# Patient Record
Sex: Female | Born: 1960 | Race: White | Hispanic: No | Marital: Married | Smoking: Never smoker
Health system: Southern US, Community
[De-identification: ages and names within clinical notes are randomized; demographics above are authoritative.]

## PROBLEM LIST (undated history)

## (undated) DIAGNOSIS — M1990 Unspecified osteoarthritis, unspecified site: Secondary | ICD-10-CM

## (undated) DIAGNOSIS — Z87442 Personal history of urinary calculi: Secondary | ICD-10-CM

## (undated) DIAGNOSIS — J45909 Unspecified asthma, uncomplicated: Secondary | ICD-10-CM

## (undated) DIAGNOSIS — T8859XA Other complications of anesthesia, initial encounter: Secondary | ICD-10-CM

## (undated) DIAGNOSIS — M25561 Pain in right knee: Secondary | ICD-10-CM

## (undated) DIAGNOSIS — G4730 Sleep apnea, unspecified: Secondary | ICD-10-CM

## (undated) DIAGNOSIS — I10 Essential (primary) hypertension: Secondary | ICD-10-CM

## (undated) HISTORY — PX: COLONOSCOPY: SHX174

## (undated) HISTORY — DX: Essential (primary) hypertension: I10

## (undated) HISTORY — PX: OTHER SURGICAL HISTORY: SHX169

---

## 1997-12-04 ENCOUNTER — Ambulatory Visit (HOSPITAL_COMMUNITY): Admission: RE | Admit: 1997-12-04 | Discharge: 1997-12-04 | Payer: Self-pay | Admitting: Dentistry

## 2010-11-23 ENCOUNTER — Ambulatory Visit: Payer: Self-pay

## 2010-11-25 ENCOUNTER — Ambulatory Visit: Payer: Self-pay

## 2013-08-27 ENCOUNTER — Ambulatory Visit: Payer: Self-pay | Admitting: Family Medicine

## 2015-05-11 ENCOUNTER — Ambulatory Visit (INDEPENDENT_AMBULATORY_CARE_PROVIDER_SITE_OTHER): Payer: BC Managed Care – PPO | Admitting: Family Medicine

## 2015-05-11 ENCOUNTER — Encounter: Payer: Self-pay | Admitting: Family Medicine

## 2015-05-11 VITALS — BP 132/80 | HR 80 | Ht 67.0 in | Wt 267.0 lb

## 2015-05-11 DIAGNOSIS — J0100 Acute maxillary sinusitis, unspecified: Secondary | ICD-10-CM | POA: Diagnosis not present

## 2015-05-11 DIAGNOSIS — J40 Bronchitis, not specified as acute or chronic: Secondary | ICD-10-CM

## 2015-05-11 MED ORDER — GUAIFENESIN-CODEINE 100-10 MG/5ML PO SOLN: 5.0000 mL | Freq: Three times a day (TID) | ORAL | Status: DC | PRN

## 2015-05-11 MED ORDER — AMOXICILLIN-POT CLAVULANATE 875-125 MG PO TABS: 1.0000 | ORAL_TABLET | Freq: Two times a day (BID) | ORAL | Status: DC

## 2015-05-11 NOTE — Progress Notes (Signed)
Name: Theresa PalauChristine P Best   MRN: [id number]    DOB: 03/02/1961   Date:05/11/2015       Progress Note  Subjective  Chief Complaint  Chief Complaint  Patient presents with  . Sinusitis    cough    Sinusitis This is a new problem. The current episode started in the past 7 days. The problem has been waxing and waning since onset. The maximum temperature recorded prior to her arrival was 100.4 - 100.9 F. The fever has been present for 1 to 2 days. Her pain is at a severity of 6/10. The pain is mild. Associated symptoms include congestion, coughing, diaphoresis, ear pain, headaches, neck pain, shortness of breath, sinus pressure, sneezing and a sore throat. Pertinent negatives include no chills or hoarse voice. Past treatments include acetaminophen and oral decongestants. The treatment provided no relief.    No problem-specific assessment & plan notes found for this encounter.   No past medical history on file.  No past surgical history on file.  No family history on file.  Social History   Social History  . Marital Status: Unknown    Spouse Name: N/A  . Number of Children: N/A  . Years of Education: N/A   Occupational History  . Not on file.   Social History Main Topics  . Smoking status: Never Smoker   . Smokeless tobacco: Not on file  . Alcohol Use: No  . Drug Use: No  . Sexual Activity: Not on file   Other Topics Concern  . Not on file   Social History Narrative  . No narrative on file    No Known Allergies   Review of Systems  Constitutional: Positive for diaphoresis. Negative for fever, chills, weight loss and malaise/fatigue.  HENT: Positive for congestion, ear pain, sinus pressure, sneezing and sore throat. Negative for ear discharge and hoarse voice.   Eyes: Negative for blurred vision.  Respiratory: Positive for cough, sputum production, shortness of breath and wheezing. Negative for hemoptysis.   Cardiovascular: Negative for chest pain, palpitations and leg  swelling.  Gastrointestinal: Negative for heartburn, nausea, abdominal pain, diarrhea, constipation, blood in stool and melena.  Genitourinary: Negative for dysuria, urgency, frequency and hematuria.  Musculoskeletal: Positive for myalgias and neck pain. Negative for back pain and joint pain.  Skin: Negative for rash.  Neurological: Positive for headaches. Negative for dizziness, tingling, sensory change and focal weakness.  Endo/Heme/Allergies: Negative for environmental allergies and polydipsia. Does not bruise/bleed easily.  Psychiatric/Behavioral: Negative for depression and suicidal ideas. The patient is not nervous/anxious and does not have insomnia.      Objective  Filed Vitals:   05/11/15 1550  BP: 132/80  Pulse: 80  Height: 5\' 7"  (1.702 m)  Weight: 267 lb (121.11 kg)    Physical Exam  Constitutional: She is well-developed, well-nourished, and in no distress. No distress.  HENT:  Head: Normocephalic and atraumatic.  Right Ear: External ear normal.  Left Ear: External ear normal.  Nose: Nose normal.  Mouth/Throat: Oropharynx is clear and moist.  Eyes: Conjunctivae and EOM are normal. Pupils are equal, round, and reactive to light. Right eye exhibits no discharge. Left eye exhibits no discharge.  Neck: Normal range of motion. Neck supple. No JVD present. No thyromegaly present.  Cardiovascular: Normal rate, regular rhythm, normal heart sounds and intact distal pulses.  Exam reveals no gallop and no friction rub.   No murmur heard. Pulmonary/Chest: Effort normal and breath sounds normal.  Abdominal: Soft. Bowel sounds are  normal. She exhibits no mass. There is no tenderness. There is no guarding.  Musculoskeletal: Normal range of motion. She exhibits no edema.  Lymphadenopathy:    She has no cervical adenopathy.  Neurological: She is alert. She has normal reflexes.  Skin: Skin is warm and dry. She is not diaphoretic.  Psychiatric: Mood and affect normal.  Nursing note  and vitals reviewed.     Assessment & Plan  Problem List Items Addressed This Visit    None    Visit Diagnoses    Acute maxillary sinusitis, recurrence not specified    -  Primary    Relevant Medications    amoxicillin-clavulanate (AUGMENTIN) 875-125 MG tablet    guaiFENesin-codeine 100-10 MG/5ML syrup    Bronchitis        Relevant Medications    amoxicillin-clavulanate (AUGMENTIN) 875-125 MG tablet    guaiFENesin-codeine 100-10 MG/5ML syrup         Dr. Hayden [facility]  05/11/2015

## 2015-05-18 ENCOUNTER — Ambulatory Visit (INDEPENDENT_AMBULATORY_CARE_PROVIDER_SITE_OTHER): Payer: BC Managed Care – PPO | Admitting: Family Medicine

## 2015-05-18 ENCOUNTER — Encounter: Payer: Self-pay | Admitting: Family Medicine

## 2015-05-18 VITALS — BP 124/74 | HR 82 | Temp 98.4°F | Ht 67.0 in | Wt 267.0 lb

## 2015-05-18 DIAGNOSIS — J219 Acute bronchiolitis, unspecified: Secondary | ICD-10-CM

## 2015-05-18 MED ORDER — GUAIFENESIN-CODEINE 100-10 MG/5ML PO SOLN: 5.0000 mL | Freq: Three times a day (TID) | ORAL | Status: DC | PRN

## 2015-05-18 MED ORDER — LEVOFLOXACIN 500 MG PO TABS: 500.0000 mg | ORAL_TABLET | Freq: Every day | ORAL | Status: DC

## 2015-05-18 MED ORDER — ALBUTEROL SULFATE HFA 108 (90 BASE) MCG/ACT IN AERS: 2.0000 | INHALATION_SPRAY | Freq: Four times a day (QID) | RESPIRATORY_TRACT | Status: DC | PRN

## 2015-05-18 NOTE — Progress Notes (Signed)
Name: KONYA FAUBLE   MRN: [id number]    DOB: 12-09-1960   Date:05/18/2015       Progress Note  Subjective  Chief Complaint  Chief Complaint  Patient presents with  . Sinusitis    has 2 days left on antibiotic    Sinusitis This is a recurrent problem. The current episode started in the past 7 days. The problem has been waxing and waning since onset. The maximum temperature recorded prior to her arrival was 100.4 - 100.9 F. The fever has been present for 3 to 4 days. Associated symptoms include congestion, coughing, diaphoresis, ear pain, shortness of breath, sinus pressure and sneezing. Pertinent negatives include no chills, headaches, neck pain or sore throat. Past treatments include acetaminophen and antibiotics. The treatment provided no relief.  Cough This is a chronic problem. The current episode started in the past 7 days. The problem has been waxing and waning. The cough is productive of purulent sputum. Associated symptoms include ear pain, a fever, shortness of breath and wheezing. Pertinent negatives include no chest pain, chills, headaches, heartburn, hemoptysis, myalgias, rash, sore throat or weight loss. The symptoms are aggravated by cold air. She has tried prescription cough suppressant for the symptoms. The treatment provided mild relief. There is no history of environmental allergies.    No problem-specific assessment & plan notes found for this encounter.   History reviewed. No pertinent past medical history.  History reviewed. No pertinent past surgical history.  History reviewed. No pertinent family history.  Social History   Social History  . Marital Status: Unknown    Spouse Name: N/A  . Number of Children: N/A  . Years of Education: N/A   Occupational History  . Not on file.   Social History Main Topics  . Smoking status: Never Smoker   . Smokeless tobacco: Not on file  . Alcohol Use: No  . Drug Use: No  . Sexual Activity: Not on file   Other  Topics Concern  . Not on file   Social History Narrative    No Known Allergies   Review of Systems  Constitutional: Positive for fever and diaphoresis. Negative for chills, weight loss and malaise/fatigue.  HENT: Positive for congestion, ear pain, sinus pressure and sneezing. Negative for ear discharge and sore throat.   Eyes: Negative for blurred vision.  Respiratory: Positive for cough, shortness of breath and wheezing. Negative for hemoptysis and sputum production.   Cardiovascular: Negative for chest pain, palpitations and leg swelling.  Gastrointestinal: Negative for heartburn, nausea, abdominal pain, diarrhea, constipation, blood in stool and melena.  Genitourinary: Negative for dysuria, urgency, frequency and hematuria.  Musculoskeletal: Negative for myalgias, back pain, joint pain and neck pain.  Skin: Negative for rash.  Neurological: Negative for dizziness, tingling, sensory change, focal weakness and headaches.  Endo/Heme/Allergies: Negative for environmental allergies and polydipsia. Does not bruise/bleed easily.  Psychiatric/Behavioral: Negative for depression and suicidal ideas. The patient is not nervous/anxious and does not have insomnia.      Objective  Filed Vitals:   05/18/15 1549  BP: 124/74  Pulse: 82  Temp: 98.4 F (36.9 C)  TempSrc: Oral  Height:  (1.702 m)  Weight: 267 lb (121.11 kg)  SpO2: 98%    Physical Exam  Constitutional: She is well-developed, well-nourished, and in no distress. No distress.  HENT:  Head: Normocephalic and atraumatic.  Right Ear: External ear normal.  Left Ear: External ear normal.  Nose: Nose normal.  Mouth/Throat: Oropharynx is clear and  moist.  Eyes: Conjunctivae and EOM are normal. Pupils are equal, round, and reactive to light. Right eye exhibits no discharge. Left eye exhibits no discharge.  Neck: Normal range of motion. Neck supple. No JVD present. No thyromegaly present.  Cardiovascular: Normal rate, regular  rhythm, normal heart sounds and intact distal pulses.  Exam reveals no gallop and no friction rub.   No murmur heard. Pulmonary/Chest: Effort normal and breath sounds normal.  Abdominal: Soft. Bowel sounds are normal. She exhibits no mass. There is no tenderness. There is no guarding.  Musculoskeletal: Normal range of motion. She exhibits no edema.  Lymphadenopathy:    She has no cervical adenopathy.  Neurological: She is alert. She has normal reflexes.  Skin: Skin is warm and dry. She is not diaphoretic.  Psychiatric: Mood and affect normal.      Assessment & Plan  Problem List Items Addressed This Visit    None    Visit Diagnoses    Bronchiolitis    -  Primary    Relevant Medications    albuterol (PROVENTIL HFA;VENTOLIN HFA) 108 (90 BASE) MCG/ACT inhaler    levofloxacin (LEVAQUIN) 500 MG tablet    guaiFENesin-codeine 100-10 MG/5ML syrup         Dr. Hayden Rasmusseneanna Ohn [facility]  05/18/2015

## 2016-04-26 ENCOUNTER — Ambulatory Visit (INDEPENDENT_AMBULATORY_CARE_PROVIDER_SITE_OTHER): Payer: BC Managed Care – PPO | Admitting: Family Medicine

## 2016-04-26 VITALS — BP 120/70 | HR 70 | Ht 67.0 in | Wt 277.0 lb

## 2016-04-26 DIAGNOSIS — J0100 Acute maxillary sinusitis, unspecified: Secondary | ICD-10-CM

## 2016-04-26 DIAGNOSIS — I10 Essential (primary) hypertension: Secondary | ICD-10-CM

## 2016-04-26 DIAGNOSIS — R351 Nocturia: Secondary | ICD-10-CM

## 2016-04-26 DIAGNOSIS — J40 Bronchitis, not specified as acute or chronic: Secondary | ICD-10-CM | POA: Diagnosis not present

## 2016-04-26 LAB — POCT CBG (FASTING - GLUCOSE)-MANUAL ENTRY: GLUCOSE FASTING, POC: 105 mg/dL — AB (ref 70–99)

## 2016-04-26 MED ORDER — LISINOPRIL-HYDROCHLOROTHIAZIDE 10-12.5 MG PO TABS: 1.0000 | ORAL_TABLET | Freq: Every day | ORAL | 5 refills | Status: DC

## 2016-04-26 MED ORDER — AMOXICILLIN-POT CLAVULANATE 875-125 MG PO TABS: 1.0000 | ORAL_TABLET | Freq: Two times a day (BID) | ORAL | 0 refills | Status: DC

## 2016-04-26 MED ORDER — GUAIFENESIN-CODEINE 100-10 MG/5ML PO SYRP: 5.0000 mL | ORAL_SOLUTION | Freq: Three times a day (TID) | ORAL | 0 refills | Status: DC | PRN

## 2016-04-26 NOTE — Progress Notes (Signed)
Name: Theresa PalauChristine P Best   MRN: [id number]    DOB: 09/22/1960   Date:04/26/2016       Progress Note  Subjective  Chief Complaint  Chief Complaint  Patient presents with  . Hypertension  . Sinusitis    cough- was in last year at this time with same thing    Hypertension  This is a chronic problem. The current episode started more than 1 year ago. The problem has been gradually improving since onset. The problem is controlled. Associated symptoms include shortness of breath. Pertinent negatives include no anxiety, blurred vision, chest pain, headaches, malaise/fatigue, neck pain, orthopnea, palpitations, peripheral edema, PND or sweats. There are no associated agents to hypertension. There are no known risk factors for coronary artery disease. Past treatments include ACE inhibitors and diuretics. The current treatment provides moderate improvement. There are no compliance problems.  There is no history of angina, kidney disease, CAD/MI, CVA, heart failure, left ventricular hypertrophy, PVD, renovascular disease or retinopathy. There is no history of chronic renal disease or a hypertension causing med.  Sinusitis  The current episode started more than 1 year ago. The problem has been gradually worsening since onset. There has been no fever. The pain is mild. Associated symptoms include coughing and shortness of breath. Pertinent negatives include no chills, congestion, diaphoresis, ear pain, headaches, hoarse voice, neck pain, sinus pressure, sneezing, sore throat or swollen glands. Past treatments include oral decongestants. The treatment provided mild relief.    No problem-specific Assessment & Plan notes found for this encounter.   No past medical history on file.  No past surgical history on file.  No family history on file.  Social History   Social History  . Marital status: Unknown    Spouse name: N/A  . Number of children: N/A  . Years of education: N/A   Occupational History  .  Not on file.   Social History Main Topics  . Smoking status: Never Smoker  . Smokeless tobacco: Not on file  . Alcohol use No  . Drug use: No  . Sexual activity: Not on file   Other Topics Concern  . Not on file   Social History Narrative  . No narrative on file    No Known Allergies   Review of Systems  Constitutional: Negative for chills, diaphoresis, fever, malaise/fatigue and weight loss.  HENT: Negative for congestion, ear discharge, ear pain, hoarse voice, sinus pressure, sneezing and sore throat.   Eyes: Negative for blurred vision.  Respiratory: Positive for cough and shortness of breath. Negative for sputum production and wheezing.   Cardiovascular: Negative for chest pain, palpitations, orthopnea, leg swelling and PND.  Gastrointestinal: Negative for abdominal pain, blood in stool, constipation, diarrhea, heartburn, melena and nausea.  Genitourinary: Negative for dysuria, frequency, hematuria and urgency.  Musculoskeletal: Negative for back pain, joint pain, myalgias and neck pain.  Skin: Negative for rash.  Neurological: Negative for dizziness, tingling, sensory change, focal weakness and headaches.  Endo/Heme/Allergies: Negative for environmental allergies and polydipsia. Does not bruise/bleed easily.  Psychiatric/Behavioral: Negative for depression and suicidal ideas. The patient is not nervous/anxious and does not have insomnia.      Objective  Vitals:   04/26/16 1545  BP: 120/70  Pulse: 70  Weight: 277 lb (125.6 kg)  Height: 5\' 7"  (1.702 m)    Physical Exam  Constitutional: She is well-developed, well-nourished, and in no distress. No distress.  HENT:  Head: Normocephalic and atraumatic.  Right Ear: Tympanic membrane, external ear  and ear canal normal.  Left Ear: Tympanic membrane, external ear and ear canal normal.  Nose: Nose normal.  Mouth/Throat: Oropharynx is clear and moist.  Eyes: Conjunctivae and EOM are normal. Pupils are equal, round, and  reactive to light. Right eye exhibits no discharge. Left eye exhibits no discharge.  Neck: Normal range of motion. Neck supple. No JVD present. No thyromegaly present.  Cardiovascular: Normal rate, regular rhythm, normal heart sounds and intact distal pulses.  Exam reveals no gallop and no friction rub.   No murmur heard. Pulmonary/Chest: Effort normal and breath sounds normal. She has no wheezes. She has no rales.  Abdominal: Soft. Bowel sounds are normal. She exhibits no mass. There is no tenderness. There is no guarding.  Musculoskeletal: Normal range of motion. She exhibits no edema.  Lymphadenopathy:    She has no cervical adenopathy.  Neurological: She is alert.  Skin: Skin is warm and dry. She is not diaphoretic.  Psychiatric: Mood and affect normal.  Nursing note and vitals reviewed.     Assessment & Plan  Problem List Items Addressed This Visit    None    Visit Diagnoses    Essential hypertension    -  Primary   Relevant Medications   lisinopril-hydrochlorothiazide (PRINZIDE,ZESTORETIC) 10-12.5 MG tablet   Acute maxillary sinusitis, recurrence not specified       Relevant Medications   amoxicillin-clavulanate (AUGMENTIN) 875-125 MG tablet   guaiFENesin-codeine (ROBITUSSIN AC) 100-10 MG/5ML syrup   Bronchitis       Relevant Medications   amoxicillin-clavulanate (AUGMENTIN) 875-125 MG tablet   guaiFENesin-codeine (ROBITUSSIN AC) 100-10 MG/5ML syrup   Nocturia       Relevant Orders   POCT CBG (Fasting - Glucose) (Completed)        Dr. Hayden Rasmusseneanna Nyah [facility]  04/26/16

## 2016-05-04 ENCOUNTER — Other Ambulatory Visit: Payer: Self-pay

## 2016-05-04 MED ORDER — LEVOFLOXACIN 500 MG PO TABS: 500.0000 mg | ORAL_TABLET | Freq: Every day | ORAL | 0 refills | Status: DC

## 2016-05-09 ENCOUNTER — Other Ambulatory Visit: Payer: Self-pay

## 2016-05-09 ENCOUNTER — Ambulatory Visit (INDEPENDENT_AMBULATORY_CARE_PROVIDER_SITE_OTHER): Payer: BC Managed Care – PPO | Admitting: Family Medicine

## 2016-05-09 ENCOUNTER — Ambulatory Visit
Admission: RE | Admit: 2016-05-09 | Discharge: 2016-05-09 | Disposition: A | Discharge status: Home / Self Care | Payer: BC Managed Care – PPO | Source: Ambulatory Visit | Attending: Family Medicine | Admitting: Family Medicine

## 2016-05-09 ENCOUNTER — Encounter: Payer: Self-pay | Admitting: Family Medicine

## 2016-05-09 VITALS — BP 140/80 | HR 78 | Temp 98.4°F | Ht 67.0 in | Wt 277.0 lb

## 2016-05-09 DIAGNOSIS — J4541 Moderate persistent asthma with (acute) exacerbation: Secondary | ICD-10-CM | POA: Diagnosis not present

## 2016-05-09 DIAGNOSIS — J219 Acute bronchiolitis, unspecified: Secondary | ICD-10-CM

## 2016-05-09 DIAGNOSIS — B356 Tinea cruris: Secondary | ICD-10-CM | POA: Diagnosis not present

## 2016-05-09 DIAGNOSIS — I10 Essential (primary) hypertension: Secondary | ICD-10-CM | POA: Diagnosis not present

## 2016-05-09 MED ORDER — FLUCONAZOLE 150 MG PO TABS: 150.0000 mg | ORAL_TABLET | Freq: Once | ORAL | 0 refills | Status: AC

## 2016-05-09 MED ORDER — BENZONATATE 100 MG PO CAPS: 100.0000 mg | ORAL_CAPSULE | Freq: Two times a day (BID) | ORAL | 0 refills | Status: DC | PRN

## 2016-05-09 MED ORDER — CLOTRIMAZOLE-BETAMETHASONE 1-0.05 % EX CREA: 1.0000 "application " | TOPICAL_CREAM | Freq: Two times a day (BID) | CUTANEOUS | 0 refills | Status: DC

## 2016-05-09 MED ORDER — AZITHROMYCIN 250 MG PO TABS: ORAL_TABLET | ORAL | 0 refills | Status: DC

## 2016-05-09 MED ORDER — PREDNISONE 10 MG PO TABS: 10.0000 mg | ORAL_TABLET | Freq: Every day | ORAL | 0 refills | Status: DC

## 2016-05-09 MED ORDER — HYDROCHLOROTHIAZIDE 12.5 MG PO TABS: 12.5000 mg | ORAL_TABLET | Freq: Every day | ORAL | 1 refills | Status: DC

## 2016-05-09 MED ORDER — ALBUTEROL SULFATE HFA 108 (90 BASE) MCG/ACT IN AERS: 2.0000 | INHALATION_SPRAY | Freq: Four times a day (QID) | RESPIRATORY_TRACT | 0 refills | Status: DC | PRN

## 2016-05-09 NOTE — Progress Notes (Signed)
Name: Theresa PalauChristine P Best   MRN: [id number]    DOB: 03/03/1961   Date:05/09/2016       Progress Note  Subjective  Chief Complaint  Chief Complaint  Patient presents with  . Follow-up    cough and cong- had Amox x 10 days then Levaquin x 7 days- finished yesterday- "as long as I stand I'm fine"    Cough  This is a recurrent problem. The current episode started 1 to 4 weeks ago. The problem has been waxing and waning. The problem occurs constantly. The cough is productive of blood-tinged sputum. Associated symptoms include chest pain, hemoptysis, nasal congestion, postnasal drip, shortness of breath and wheezing. Pertinent negatives include no chills, ear congestion, ear pain, fever, headaches, heartburn, myalgias, rash, rhinorrhea, sore throat, sweats or weight loss. Associated symptoms comments: Discomfort in epiglottic level. Treatments tried: mucinex d. The treatment provided no relief. There is no history of asthma, bronchiectasis, bronchitis, COPD, emphysema, environmental allergies or pneumonia.    No problem-specific Assessment & Plan notes found for this encounter.   Past Medical History:  Diagnosis Date  . Hypertension     History reviewed. No pertinent surgical history.  History reviewed. No pertinent family history.  Social History   Social History  . Marital status: Unknown    Spouse name: N/A  . Number of children: N/A  . Years of education: N/A   Occupational History  . Not on file.   Social History Main Topics  . Smoking status: Never Smoker  . Smokeless tobacco: Not on file  . Alcohol use No  . Drug use: No  . Sexual activity: Not on file   Other Topics Concern  . Not on file   Social History Narrative  . No narrative on file    No Known Allergies   Review of Systems  Constitutional: Negative for chills, fever, malaise/fatigue and weight loss.  HENT: Positive for postnasal drip. Negative for ear discharge, ear pain, rhinorrhea and sore throat.    Eyes: Negative for blurred vision.  Respiratory: Positive for cough, hemoptysis, shortness of breath and wheezing. Negative for sputum production.   Cardiovascular: Positive for chest pain. Negative for palpitations and leg swelling.  Gastrointestinal: Negative for abdominal pain, blood in stool, constipation, diarrhea, heartburn, melena and nausea.  Genitourinary: Negative for dysuria, frequency, hematuria and urgency.  Musculoskeletal: Negative for back pain, joint pain, myalgias and neck pain.  Skin: Negative for rash.  Neurological: Negative for dizziness, tingling, sensory change, focal weakness and headaches.  Endo/Heme/Allergies: Negative for environmental allergies and polydipsia. Does not bruise/bleed easily.  Psychiatric/Behavioral: Negative for depression and suicidal ideas. The patient is not nervous/anxious and does not have insomnia.      Objective  Vitals:   05/09/16 1609  BP: 140/80  Pulse: 78  Temp: 98.4 F (36.9 C)  TempSrc: Oral  SpO2: 96%  Weight: 277 lb (125.6 kg)  Height: 5\' 7"  (1.702 m)    Physical Exam  Constitutional: She is well-developed, well-nourished, and in no distress. No distress.  HENT:  Head: Normocephalic and atraumatic.  Right Ear: External ear normal.  Left Ear: External ear normal.  Nose: Nose normal.  Mouth/Throat: Oropharynx is clear and moist.  Eyes: Conjunctivae and EOM are normal. Pupils are equal, round, and reactive to light. Right eye exhibits no discharge. Left eye exhibits no discharge.  Neck: Normal range of motion. Neck supple. No JVD present. No thyromegaly present.  Cardiovascular: Normal rate, regular rhythm, normal heart sounds and intact distal  pulses.  Exam reveals no gallop and no friction rub.   No murmur heard. Pulmonary/Chest: Effort normal. She has wheezes.  Abdominal: Soft. Bowel sounds are normal. She exhibits no mass. There is no tenderness. There is no rebound and no guarding.  Musculoskeletal: Normal range  of motion. She exhibits no edema.  Lymphadenopathy:    She has no cervical adenopathy.  Neurological: She is alert. She has normal reflexes.  Skin: Skin is warm and dry. She is not diaphoretic.  Psychiatric: Mood and affect normal.  Nursing note and vitals reviewed.     Assessment & Plan  Problem List Items Addressed This Visit    None    Visit Diagnoses    Moderate persistent reactive airway disease with acute exacerbation    -  Primary   Relevant Medications   predniSONE (DELTASONE) 10 MG tablet   albuterol (PROVENTIL HFA;VENTOLIN HFA) 108 (90 Base) MCG/ACT inhaler   Other Relevant Orders   DG Chest 2 View   Bronchiolitis       Relevant Medications   azithromycin (ZITHROMAX) 250 MG tablet   predniSONE (DELTASONE) 10 MG tablet   benzonatate (TESSALON) 100 MG capsule   Other Relevant Orders   DG Chest 2 View   Tinea inguinalis       Relevant Medications   azithromycin (ZITHROMAX) 250 MG tablet   fluconazole (DIFLUCAN) 150 MG tablet   clotrimazole-betamethasone (LOTRISONE) cream   Essential hypertension       Relevant Medications   hydrochlorothiazide (HYDRODIURIL) 12.5 MG tablet        Dr. Hayden Rasmusseneanna Deston [facility]  05/09/16

## 2016-05-10 ENCOUNTER — Ambulatory Visit: Payer: BC Managed Care – PPO | Admitting: Family Medicine

## 2016-08-25 ENCOUNTER — Other Ambulatory Visit: Payer: Self-pay | Admitting: Family Medicine

## 2016-08-25 ENCOUNTER — Other Ambulatory Visit: Payer: Self-pay

## 2016-08-25 DIAGNOSIS — B356 Tinea cruris: Secondary | ICD-10-CM

## 2016-08-25 MED ORDER — CLOTRIMAZOLE-BETAMETHASONE 1-0.05 % EX CREA: 1.0000 "application " | TOPICAL_CREAM | Freq: Two times a day (BID) | CUTANEOUS | 0 refills | Status: DC

## 2016-08-25 MED ORDER — FLUCONAZOLE 150 MG PO TABS: 150.0000 mg | ORAL_TABLET | Freq: Once | ORAL | 0 refills | Status: AC

## 2016-10-03 ENCOUNTER — Ambulatory Visit (INDEPENDENT_AMBULATORY_CARE_PROVIDER_SITE_OTHER): Payer: BC Managed Care – PPO | Admitting: Family Medicine

## 2016-10-03 VITALS — BP 130/80 | HR 64 | Temp 98.3°F | Ht 67.0 in | Wt 277.0 lb

## 2016-10-03 DIAGNOSIS — J40 Bronchitis, not specified as acute or chronic: Secondary | ICD-10-CM

## 2016-10-03 DIAGNOSIS — J4541 Moderate persistent asthma with (acute) exacerbation: Secondary | ICD-10-CM

## 2016-10-03 DIAGNOSIS — R509 Fever, unspecified: Secondary | ICD-10-CM

## 2016-10-03 DIAGNOSIS — J219 Acute bronchiolitis, unspecified: Secondary | ICD-10-CM | POA: Diagnosis not present

## 2016-10-03 LAB — POCT INFLUENZA A/B
INFLUENZA A, POC: NEGATIVE
INFLUENZA B, POC: NEGATIVE

## 2016-10-03 MED ORDER — AZITHROMYCIN 250 MG PO TABS: ORAL_TABLET | ORAL | 0 refills | Status: DC

## 2016-10-03 MED ORDER — ALBUTEROL SULFATE HFA 108 (90 BASE) MCG/ACT IN AERS: 2.0000 | INHALATION_SPRAY | Freq: Four times a day (QID) | RESPIRATORY_TRACT | 0 refills | Status: DC | PRN

## 2016-10-03 MED ORDER — GUAIFENESIN-CODEINE 100-10 MG/5ML PO SYRP: 5.0000 mL | ORAL_SOLUTION | Freq: Three times a day (TID) | ORAL | 0 refills | Status: DC | PRN

## 2016-10-03 MED ORDER — PREDNISONE 10 MG PO TABS: 10.0000 mg | ORAL_TABLET | Freq: Every day | ORAL | 0 refills | Status: DC

## 2016-10-03 NOTE — Progress Notes (Signed)
Name: Theresa Best   MRN: [id number]    DOB: 1960-12-28   Date:10/03/2016       Progress Note  Subjective  Chief Complaint  Chief Complaint  Patient presents with  . Cough    cong, drainage, brown production, nasal drainage and sneezing. R) ear pain    Cough  This is a new problem. The current episode started in the past 7 days. The problem has been gradually worsening. The problem occurs every few minutes. The cough is productive of purulent sputum (brown). Associated symptoms include chills, ear pain, a fever, headaches, myalgias, nasal congestion, postnasal drip, a sore throat, shortness of breath and wheezing. Pertinent negatives include no chest pain, ear congestion, heartburn, hemoptysis, rash, rhinorrhea, sweats or weight loss. The symptoms are aggravated by pollens. Treatments tried: "allergy pill" Her past medical history is significant for environmental allergies. There is no history of asthma, bronchitis, COPD or emphysema.    No problem-specific Assessment & Plan notes found for this encounter.   Past Medical History:  Diagnosis Date  . Hypertension     No past surgical history on file.  No family history on file.  Social History   Social History  . Marital status: Unknown    Spouse name: N/A  . Number of children: N/A  . Years of education: N/A   Occupational History  . Not on file.   Social History Main Topics  . Smoking status: Never Smoker  . Smokeless tobacco: Not on file  . Alcohol use No  . Drug use: No  . Sexual activity: Not on file   Other Topics Concern  . Not on file   Social History Narrative  . No narrative on file    No Known Allergies  Outpatient Medications Prior to Visit  Medication Sig Dispense Refill  . albuterol (PROVENTIL HFA;VENTOLIN HFA) 108 (90 Base) MCG/ACT inhaler Inhale 2 puffs into the lungs every 6 (six) hours as needed for wheezing or shortness of breath. 1 Inhaler 0  . clotrimazole-betamethasone (LOTRISONE) cream  Apply 1 application topically 2 (two) times daily. 30 g 0  . albuterol (PROVENTIL HFA;VENTOLIN HFA) 108 (90 BASE) MCG/ACT inhaler Inhale 2 puffs into the lungs every 6 (six) hours as needed for wheezing or shortness of breath. 1 Inhaler 0  . hydrochlorothiazide (HYDRODIURIL) 12.5 MG tablet Take 1 tablet (12.5 mg total) by mouth daily. (Patient not taking: Reported on 10/03/2016) 30 tablet 1  . azithromycin (ZITHROMAX) 250 MG tablet 2 today then 1 a day for 4 days 6 tablet 0  . benzonatate (TESSALON) 100 MG capsule Take 1 capsule (100 mg total) by mouth 2 (two) times daily as needed for cough. 20 capsule 0  . guaiFENesin-codeine (ROBITUSSIN AC) 100-10 MG/5ML syrup Take 5 mLs by mouth 3 (three) times daily as needed for cough. (Patient not taking: Reported on 05/09/2016) 150 mL 0  . levofloxacin (LEVAQUIN) 500 MG tablet Take 1 tablet (500 mg total) by mouth daily. (Patient not taking: Reported on 05/09/2016) 7 tablet 0  . predniSONE (DELTASONE) 10 MG tablet Take 1 tablet (10 mg total) by mouth daily with breakfast. 30 tablet 0   No facility-administered medications prior to visit.     Review of Systems  Constitutional: Positive for chills and fever. Negative for malaise/fatigue and weight loss.  HENT: Positive for ear pain, postnasal drip and sore throat. Negative for ear discharge and rhinorrhea.   Eyes: Negative for blurred vision.  Respiratory: Positive for cough, shortness of breath and  wheezing. Negative for hemoptysis and sputum production.   Cardiovascular: Negative for chest pain, palpitations and leg swelling.  Gastrointestinal: Negative for abdominal pain, blood in stool, constipation, diarrhea, heartburn, melena and nausea.  Genitourinary: Negative for dysuria, frequency, hematuria and urgency.  Musculoskeletal: Positive for myalgias. Negative for back pain, joint pain and neck pain.  Skin: Negative for rash.  Neurological: Positive for headaches. Negative for dizziness, tingling,  sensory change and focal weakness.  Endo/Heme/Allergies: Positive for environmental allergies. Negative for polydipsia. Does not bruise/bleed easily.  Psychiatric/Behavioral: Negative for depression and suicidal ideas. The patient is not nervous/anxious and does not have insomnia.      Objective  Vitals:   10/03/16 1604  BP: 130/80  Pulse: 64  Temp: 98.3 F (36.8 C)  TempSrc: Oral  Weight: 277 lb (125.6 kg)  Height:  (1.702 m)    Physical Exam  Constitutional: She is well-developed, well-nourished, and in no distress. No distress.  HENT:  Head: Normocephalic and atraumatic.  Right Ear: External ear normal.  Left Ear: External ear normal.  Nose: Nose normal.  Mouth/Throat: Oropharynx is clear and moist.  Eyes: Conjunctivae and EOM are normal. Pupils are equal, round, and reactive to light. Right eye exhibits no discharge. Left eye exhibits no discharge.  Neck: Normal range of motion. Neck supple. No JVD present. No thyromegaly present.  Cardiovascular: Normal rate, regular rhythm, normal heart sounds and intact distal pulses.  Exam reveals no gallop and no friction rub.   No murmur heard. Pulmonary/Chest: Effort normal and breath sounds normal.  Abdominal: Soft. Bowel sounds are normal. She exhibits no mass. There is no tenderness. There is no guarding.  Musculoskeletal: Normal range of motion. She exhibits no edema.  Lymphadenopathy:    She has no cervical adenopathy.  Neurological: She is alert. She has normal reflexes.  Skin: Skin is warm and dry. She is not diaphoretic.  Psychiatric: Mood and affect normal.  Nursing note and vitals reviewed.     Assessment & Plan  Problem List Items Addressed This Visit    None    Visit Diagnoses    Bronchitis    -  Primary   Relevant Medications   guaiFENesin-codeine (ROBITUSSIN AC) 100-10 MG/5ML syrup   Moderate persistent reactive airway disease with acute exacerbation       Relevant Medications   predniSONE  (DELTASONE) 10 MG tablet   albuterol (PROVENTIL HFA;VENTOLIN HFA) 108 (90 Base) MCG/ACT inhaler   Bronchiolitis       Relevant Medications   predniSONE (DELTASONE) 10 MG tablet   albuterol (PROVENTIL HFA;VENTOLIN HFA) 108 (90 Base) MCG/ACT inhaler   azithromycin (ZITHROMAX) 250 MG tablet   Fever and chills       Relevant Orders   POCT Influenza A/B (Completed)      Meds ordered this encounter  Medications  . guaiFENesin-codeine (ROBITUSSIN AC) 100-10 MG/5ML syrup    Sig: Take 5 mLs by mouth 3 (three) times daily as needed for cough.    Dispense:  150 mL    Refill:  0  . predniSONE (DELTASONE) 10 MG tablet    Sig: Take 1 tablet (10 mg total) by mouth daily with breakfast.    Dispense:  30 tablet    Refill:  0  . albuterol (PROVENTIL HFA;VENTOLIN HFA) 108 (90 Base) MCG/ACT inhaler    Sig: Inhale 2 puffs into the lungs every 6 (six) hours as needed for wheezing or shortness of breath.    Dispense:  1 Inhaler  Refill:  0  . azithromycin (ZITHROMAX) 250 MG tablet    Sig: 2 today then 1 a day for 4 days    Dispense:  6 tablet    Refill:  0      Dr. Hayden [facility]  10/03/16

## 2016-10-10 ENCOUNTER — Other Ambulatory Visit: Payer: Self-pay | Admitting: Family Medicine

## 2016-10-10 ENCOUNTER — Telehealth: Payer: Self-pay

## 2016-10-10 MED ORDER — AMOXICILLIN-POT CLAVULANATE 875-125 MG PO TABS: 1.0000 | ORAL_TABLET | Freq: Two times a day (BID) | ORAL | 0 refills | Status: DC

## 2016-10-10 MED ORDER — BENZONATATE 100 MG PO CAPS: 100.0000 mg | ORAL_CAPSULE | Freq: Two times a day (BID) | ORAL | 0 refills | Status: DC | PRN

## 2016-10-10 NOTE — Telephone Encounter (Signed)
Finished Zpak last week. No better and coughing horrible. Should she come in or do you want to call in second round? Finished last dose [employer]

## 2016-10-10 NOTE — Telephone Encounter (Signed)
Sent in Augmentin and tessalon perles to [facility]- pt advised if after 4 days of this, she is NO better- go to hospital for further testing especially if still running fever

## 2016-11-03 DIAGNOSIS — I10 Essential (primary) hypertension: Secondary | ICD-10-CM | POA: Insufficient documentation

## 2016-11-03 DIAGNOSIS — Z6841 Body Mass Index (BMI) 40.0 and over, adult: Secondary | ICD-10-CM

## 2017-01-10 ENCOUNTER — Ambulatory Visit: Payer: BC Managed Care – PPO | Attending: Otolaryngology

## 2017-01-10 DIAGNOSIS — R0683 Snoring: Secondary | ICD-10-CM | POA: Diagnosis present

## 2017-01-10 DIAGNOSIS — I10 Essential (primary) hypertension: Secondary | ICD-10-CM | POA: Diagnosis present

## 2017-01-10 DIAGNOSIS — G4710 Hypersomnia, unspecified: Secondary | ICD-10-CM | POA: Insufficient documentation

## 2017-01-10 DIAGNOSIS — E669 Obesity, unspecified: Secondary | ICD-10-CM | POA: Diagnosis present

## 2017-01-10 DIAGNOSIS — G4733 Obstructive sleep apnea (adult) (pediatric): Secondary | ICD-10-CM | POA: Diagnosis not present

## 2017-06-26 ENCOUNTER — Ambulatory Visit: Payer: BC Managed Care – PPO | Admitting: Family Medicine

## 2017-07-03 ENCOUNTER — Ambulatory Visit: Payer: BC Managed Care – PPO | Admitting: Family Medicine

## 2017-07-06 ENCOUNTER — Ambulatory Visit: Payer: BC Managed Care – PPO | Admitting: Family Medicine

## 2017-07-06 ENCOUNTER — Encounter: Payer: Self-pay | Admitting: Family Medicine

## 2017-07-06 VITALS — BP 158/100 | HR 86 | Temp 98.5°F | Ht 67.0 in | Wt 278.0 lb

## 2017-07-06 DIAGNOSIS — J40 Bronchitis, not specified as acute or chronic: Secondary | ICD-10-CM | POA: Diagnosis not present

## 2017-07-06 DIAGNOSIS — J0100 Acute maxillary sinusitis, unspecified: Secondary | ICD-10-CM

## 2017-07-06 DIAGNOSIS — I10 Essential (primary) hypertension: Secondary | ICD-10-CM | POA: Diagnosis not present

## 2017-07-06 DIAGNOSIS — J219 Acute bronchiolitis, unspecified: Secondary | ICD-10-CM | POA: Diagnosis not present

## 2017-07-06 DIAGNOSIS — J4521 Mild intermittent asthma with (acute) exacerbation: Secondary | ICD-10-CM

## 2017-07-06 MED ORDER — AMOXICILLIN-POT CLAVULANATE 875-125 MG PO TABS
1.0000 | ORAL_TABLET | Freq: Two times a day (BID) | ORAL | 0 refills | Status: DC
Start: 2017-07-06 — End: 2017-10-05

## 2017-07-06 MED ORDER — HYDROCHLOROTHIAZIDE 12.5 MG PO TABS: 12.5000 mg | ORAL_TABLET | Freq: Every day | ORAL | 1 refills | Status: DC

## 2017-07-06 MED ORDER — GUAIFENESIN-CODEINE 100-10 MG/5ML PO SYRP: 5.0000 mL | ORAL_SOLUTION | Freq: Three times a day (TID) | ORAL | 0 refills | Status: DC | PRN

## 2017-07-06 MED ORDER — ALBUTEROL SULFATE HFA 108 (90 BASE) MCG/ACT IN AERS: 2.0000 | INHALATION_SPRAY | Freq: Four times a day (QID) | RESPIRATORY_TRACT | 0 refills | Status: DC | PRN

## 2017-07-06 NOTE — Progress Notes (Signed)
Name: Theresa Best   MRN: [id number]    DOB: 06-21-1960   Date:07/06/2017       Progress Note  Subjective  Chief Complaint  Chief Complaint  Patient presents with  . Sinusitis    cough and cong- brown/ green production, R) ear pain    Sinusitis  This is a new problem. The current episode started in the past 7 days. The problem is unchanged. There has been no fever. The fever has been present for 3 to 4 days. Her pain is at a severity of 5/10. The pain is moderate. Associated symptoms include congestion and sinus pressure. Pertinent negatives include no chills, coughing, diaphoresis, ear pain, headaches, hoarse voice, neck pain, shortness of breath, sneezing, sore throat or swollen glands. Past treatments include oral decongestants (claritin). The treatment provided no relief.  Cough  This is a new problem. The current episode started in the past 7 days. The problem has been waxing and waning. The cough is productive of sputum. Pertinent negatives include no chest pain, chills, ear congestion, ear pain, fever, headaches, heartburn, hemoptysis, myalgias, nasal congestion, postnasal drip, rash, rhinorrhea, sore throat, shortness of breath, sweats, weight loss or wheezing. Nothing aggravates the symptoms. There is no history of environmental allergies.  Hypertension  This is a chronic problem. The current episode started in the past 7 days. The problem has been gradually improving since onset. The problem is controlled. Pertinent negatives include no blurred vision, chest pain, headaches, malaise/fatigue, neck pain, palpitations, shortness of breath or sweats. There are no associated agents to hypertension. Risk factors for coronary artery disease include obesity and dyslipidemia.    No problem-specific Assessment & Plan notes found for this encounter.   Past Medical History:  Diagnosis Date  . Hypertension     No past surgical history on file.  No family history on file.  Social  History   Socioeconomic History  . Marital status: Unknown    Spouse name: Not on file  . Number of children: Not on file  . Years of education: Not on file  . Highest education level: Not on file  Social Needs  . Financial resource strain: Not on file  . Food insecurity - worry: Not on file  . Food insecurity - inability: Not on file  . Transportation needs - medical: Not on file  . Transportation needs - non-medical: Not on file  Occupational History  . Not on file  Tobacco Use  . Smoking status: Never Smoker  . Smokeless tobacco: Never Used  Substance and Sexual Activity  . Alcohol use: No    Alcohol/week: 0.0 oz  . Drug use: No  . Sexual activity: Not on file  Other Topics Concern  . Not on file  Social History Narrative  . Not on file    No Known Allergies  Outpatient Medications Prior to Visit  Medication Sig Dispense Refill  . clotrimazole-betamethasone (LOTRISONE) cream Apply 1 application topically 2 (two) times daily. (Patient not taking: Reported on 07/06/2017) 30 g 0  . albuterol (PROVENTIL HFA;VENTOLIN HFA) 108 (90 Base) MCG/ACT inhaler Inhale 2 puffs into the lungs every 6 (six) hours as needed for wheezing or shortness of breath. 1 Inhaler 0  . albuterol (PROVENTIL HFA;VENTOLIN HFA) 108 (90 Base) MCG/ACT inhaler Inhale 2 puffs into the lungs every 6 (six) hours as needed for wheezing or shortness of breath. 1 Inhaler 0  . amoxicillin-clavulanate (AUGMENTIN) 875-125 MG tablet Take 1 tablet by mouth 2 (two) times daily. 20  tablet 0  . azithromycin (ZITHROMAX) 250 MG tablet 2 today then 1 a day for 4 days 6 tablet 0  . benzonatate (TESSALON) 100 MG capsule Take 1 capsule (100 mg total) by mouth 2 (two) times daily as needed for cough. 20 capsule 0  . guaiFENesin-codeine (ROBITUSSIN AC) 100-10 MG/5ML syrup Take 5 mLs by mouth 3 (three) times daily as needed for cough. 150 mL 0  . hydrochlorothiazide (HYDRODIURIL) 12.5 MG tablet Take 1 tablet (12.5 mg total) by mouth  daily. (Patient not taking: Reported on 10/03/2016) 30 tablet 1  . predniSONE (DELTASONE) 10 MG tablet Take 1 tablet (10 mg total) by mouth daily with breakfast. 30 tablet 0   No facility-administered medications prior to visit.     Review of Systems  Constitutional: Negative for chills, diaphoresis, fever, malaise/fatigue and weight loss.  HENT: Positive for congestion and sinus pressure. Negative for ear discharge, ear pain, hoarse voice, postnasal drip, rhinorrhea, sneezing and sore throat.   Eyes: Negative for blurred vision.  Respiratory: Negative for cough, hemoptysis, sputum production, shortness of breath and wheezing.   Cardiovascular: Negative for chest pain, palpitations and leg swelling.  Gastrointestinal: Negative for abdominal pain, blood in stool, constipation, diarrhea, heartburn, melena and nausea.  Genitourinary: Negative for dysuria, frequency, hematuria and urgency.  Musculoskeletal: Negative for back pain, joint pain, myalgias and neck pain.  Skin: Negative for rash.  Neurological: Negative for dizziness, tingling, sensory change, focal weakness and headaches.  Endo/Heme/Allergies: Negative for environmental allergies and polydipsia. Does not bruise/bleed easily.  Psychiatric/Behavioral: Negative for depression and suicidal ideas. The patient is not nervous/anxious and does not have insomnia.      Objective  Vitals:   07/06/17 1531  BP: (!) 158/100  Pulse: 86  Temp: 98.5 F (36.9 C)  TempSrc: Oral  SpO2: 96%  Weight: 278 lb (126.1 kg)  Height: 5\' 7"  (1.702 m)    Physical Exam  Constitutional: She is well-developed, well-nourished, and in no distress. No distress.  HENT:  Head: Normocephalic and atraumatic.  Right Ear: External ear normal.  Left Ear: External ear normal.  Nose: Nose normal.  Mouth/Throat: Oropharynx is clear and moist.  Eyes: Conjunctivae and EOM are normal. Pupils are equal, round, and reactive to light. Right eye exhibits no discharge.  Left eye exhibits no discharge.  Neck: Normal range of motion. Neck supple. No JVD present. No thyromegaly present.  Cardiovascular: Normal rate, regular rhythm, normal heart sounds and intact distal pulses. Exam reveals no gallop and no friction rub.  No murmur heard. Pulmonary/Chest: Effort normal. She has wheezes. She has no rales.  Abdominal: Soft. Bowel sounds are normal. She exhibits no mass. There is no tenderness. There is no guarding.  Musculoskeletal: Normal range of motion. She exhibits no edema.  Lymphadenopathy:    She has no cervical adenopathy.  Neurological: She is alert. She has normal reflexes.  Skin: Skin is warm and dry. She is not diaphoretic.  Psychiatric: Mood and affect normal.  Nursing note and vitals reviewed.     Assessment & Plan  Problem List Items Addressed This Visit    None    Visit Diagnoses    Acute maxillary sinusitis, recurrence not specified    -  Primary   Relevant Medications   guaiFENesin-codeine (ROBITUSSIN AC) 100-10 MG/5ML syrup   amoxicillin-clavulanate (AUGMENTIN) 875-125 MG tablet   Bronchitis       Relevant Medications   guaiFENesin-codeine (ROBITUSSIN AC) 100-10 MG/5ML syrup   amoxicillin-clavulanate (AUGMENTIN) 875-125 MG tablet  Bronchiolitis       Relevant Medications   albuterol (PROVENTIL HFA;VENTOLIN HFA) 108 (90 Base) MCG/ACT inhaler   Essential hypertension       Relevant Medications   hydrochlorothiazide (HYDRODIURIL) 12.5 MG tablet   Mild intermittent reactive airway disease with acute exacerbation       Relevant Medications   guaiFENesin-codeine (ROBITUSSIN AC) 100-10 MG/5ML syrup   albuterol (PROVENTIL HFA;VENTOLIN HFA) 108 (90 Base) MCG/ACT inhaler      Meds ordered this encounter  Medications  . guaiFENesin-codeine (ROBITUSSIN AC) 100-10 MG/5ML syrup    Sig: Take 5 mLs by mouth 3 (three) times daily as needed for cough.    Dispense:  150 mL    Refill:  0  . albuterol (PROVENTIL HFA;VENTOLIN HFA) 108 (90  Base) MCG/ACT inhaler    Sig: Inhale 2 puffs into the lungs every 6 (six) hours as needed for wheezing or shortness of breath.    Dispense:  1 Inhaler    Refill:  0  . amoxicillin-clavulanate (AUGMENTIN) 875-125 MG tablet    Sig: Take 1 tablet by mouth 2 (two) times daily.    Dispense:  20 tablet    Refill:  0  . hydrochlorothiazide (HYDRODIURIL) 12.5 MG tablet    Sig: Take 1 tablet (12.5 mg total) by mouth daily.    Dispense:  30 tablet    Refill:  1      Dr. Hayden Rasmusseneanna Shanecia [facility]  07/06/17

## 2017-09-12 IMAGING — CR DG CHEST 2V
2 series · 2 of 2 positions shown · non-contrast
Comparison: Radiographs August 27, 2013.

CLINICAL DATA: Persistent cough.

EXAM:
CHEST  2 VIEW

[chest pa]
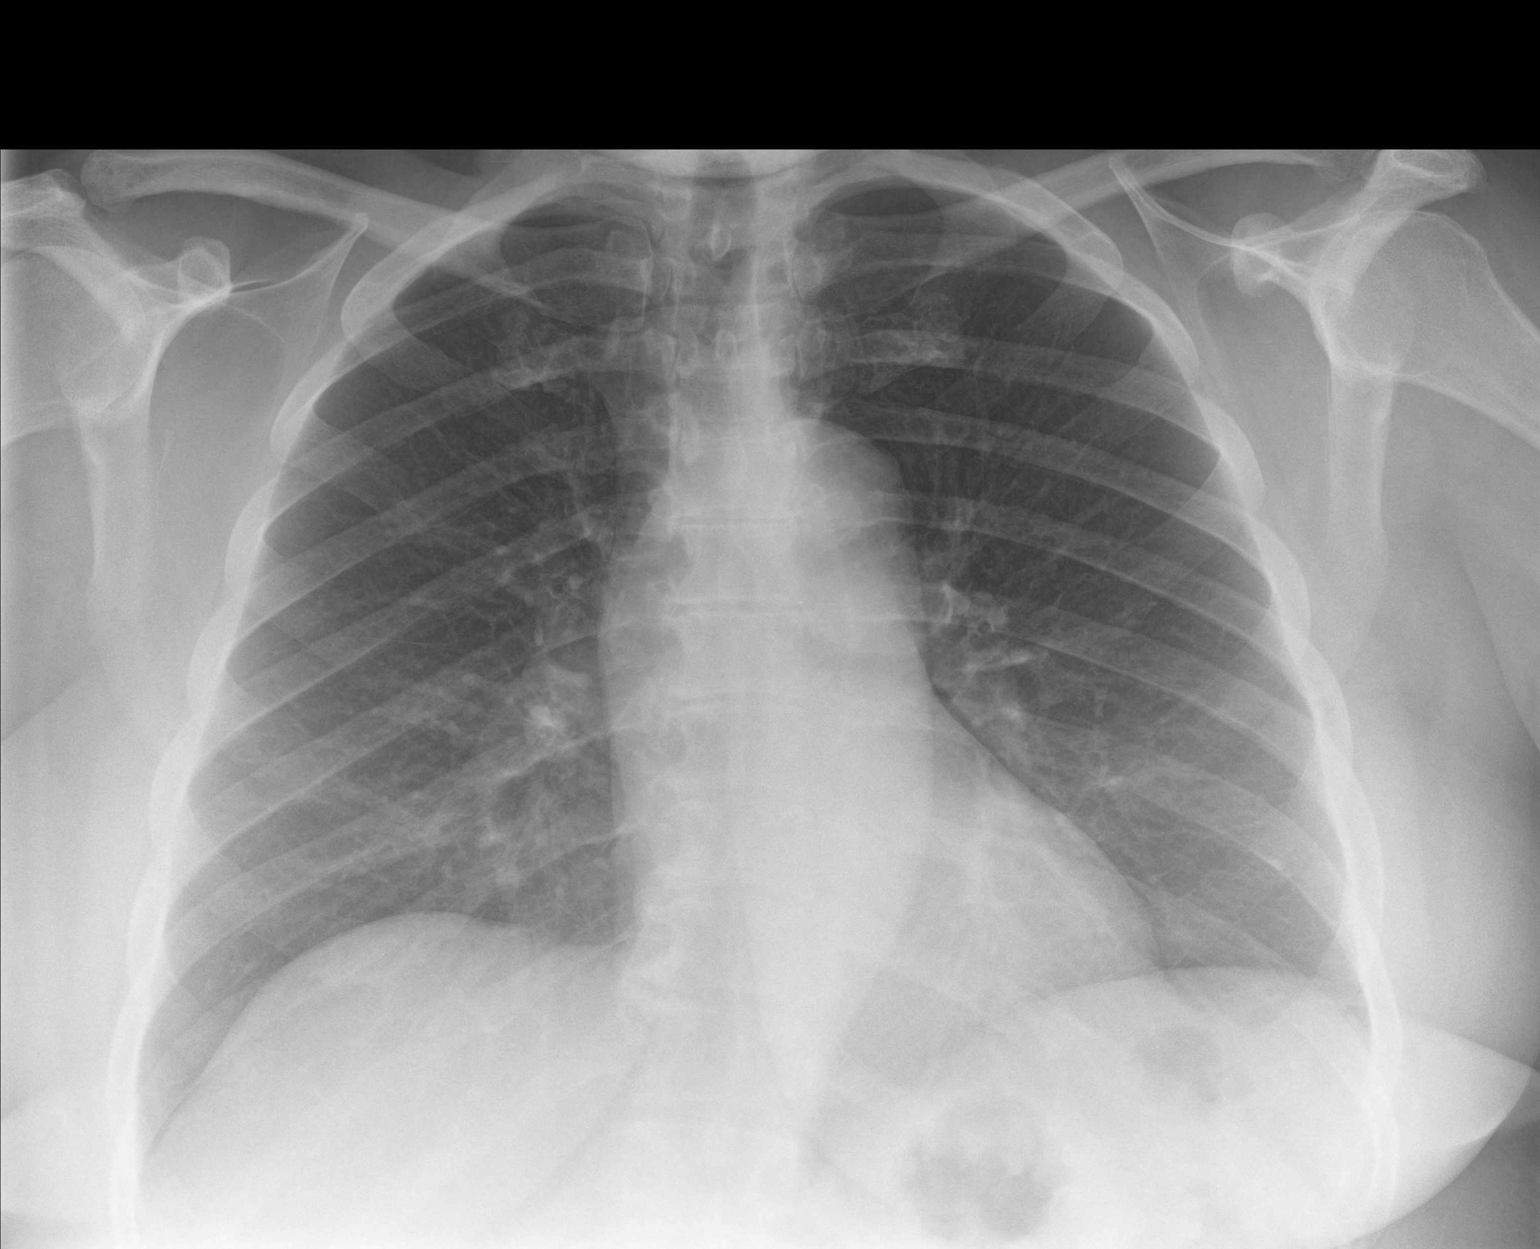

[chest lat]
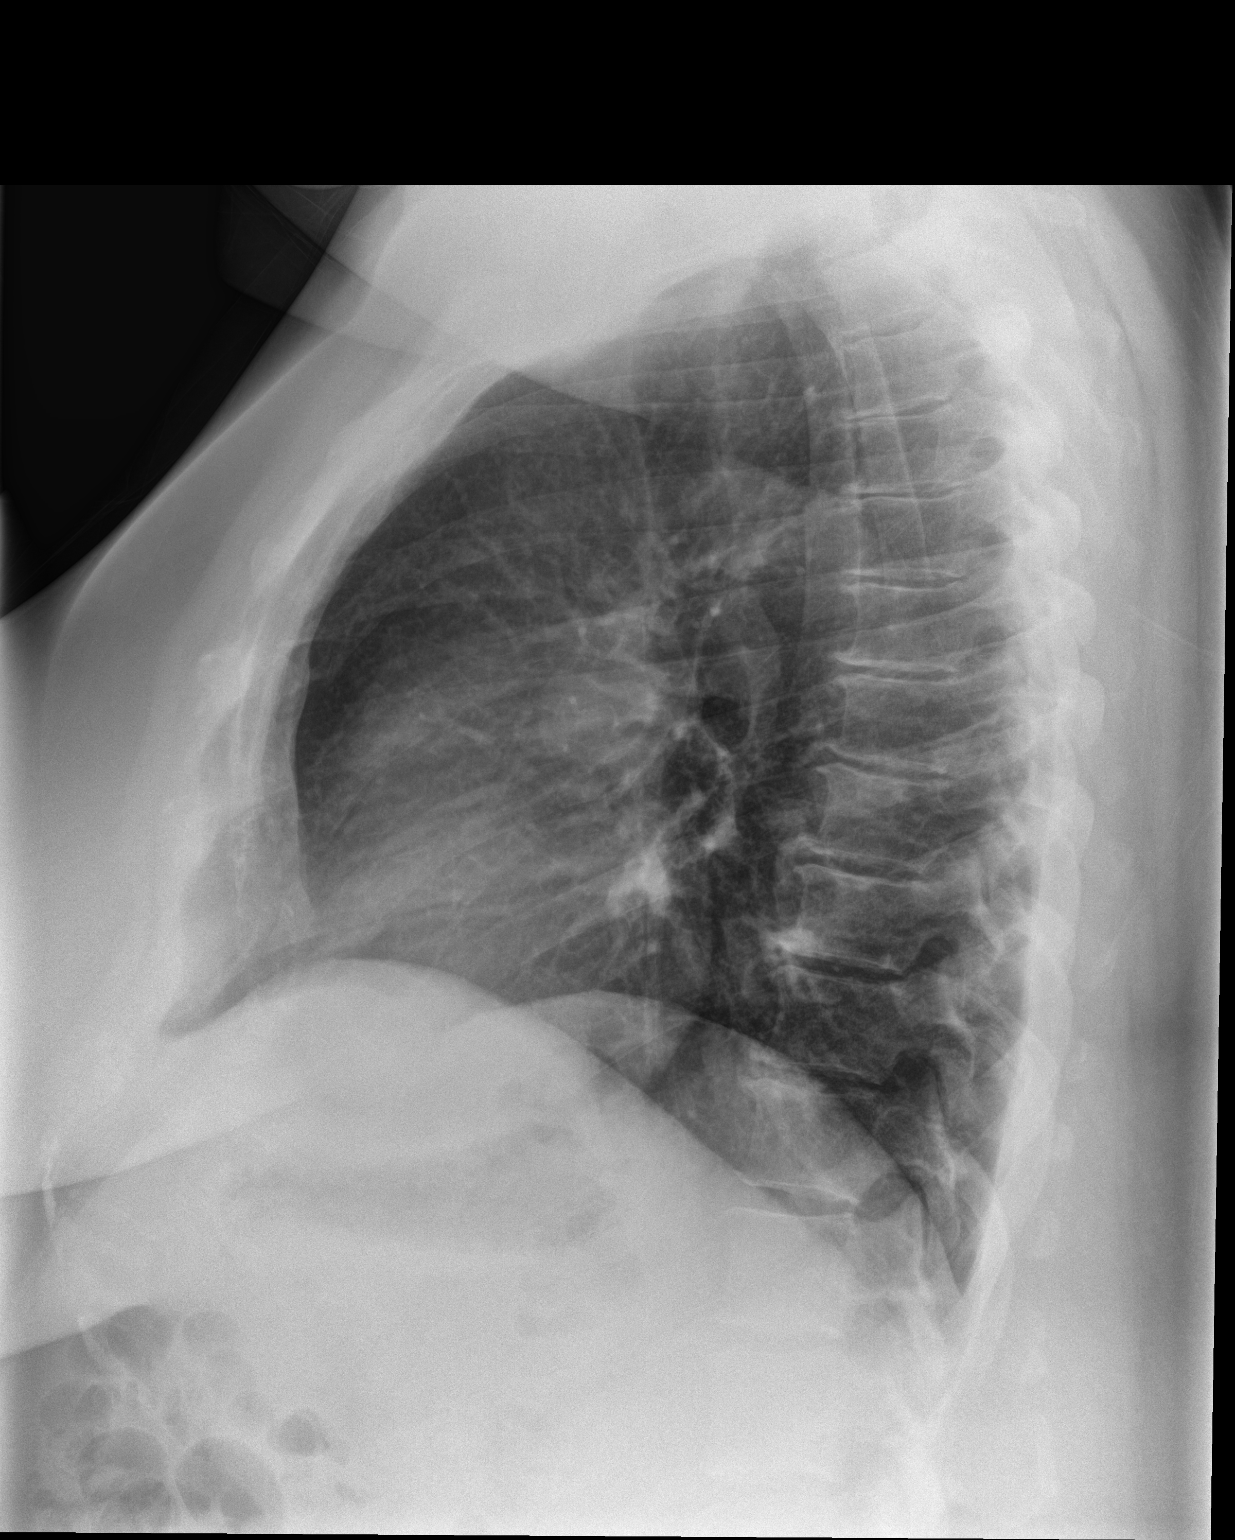

[2 of 2 positions shown; findings below may reference images not displayed]

FINDINGS: The heart size and mediastinal contours are within normal limits.
Both lungs are clear. No pneumothorax or pleural effusion is noted.
The visualized skeletal structures are unremarkable.
IMPRESSION: No active cardiopulmonary disease.

## 2017-10-05 ENCOUNTER — Ambulatory Visit: Payer: BC Managed Care – PPO | Admitting: Family Medicine

## 2017-10-05 ENCOUNTER — Encounter: Payer: Self-pay | Admitting: Family Medicine

## 2017-10-05 VITALS — BP 140/92 | HR 86 | Resp 12 | Ht 67.0 in | Wt 277.0 lb

## 2017-10-05 DIAGNOSIS — I10 Essential (primary) hypertension: Secondary | ICD-10-CM

## 2017-10-05 DIAGNOSIS — J0100 Acute maxillary sinusitis, unspecified: Secondary | ICD-10-CM | POA: Diagnosis not present

## 2017-10-05 MED ORDER — AZITHROMYCIN 250 MG PO TABS: ORAL_TABLET | ORAL | 0 refills | Status: DC

## 2017-10-05 MED ORDER — AMLODIPINE BESYLATE 5 MG PO TABS: 5.0000 mg | ORAL_TABLET | Freq: Every day | ORAL | 5 refills | Status: DC

## 2017-10-05 MED ORDER — HYDROCHLOROTHIAZIDE 12.5 MG PO TABS: 12.5000 mg | ORAL_TABLET | Freq: Every day | ORAL | 5 refills | Status: DC

## 2017-10-05 NOTE — Progress Notes (Signed)
Name: Theresa PalauChristine P Best   MRN: [id number]    DOB: 02/11/1961   Date:10/05/2017       Progress Note  Subjective  Chief Complaint  Chief Complaint  Patient presents with  . Hypertension  . Cough    someone working with her had pneumonia    Hypertension  This is a chronic problem. The current episode started more than 1 month ago. The problem has been gradually worsening since onset. The problem is controlled. Pertinent negatives include no anxiety, blurred vision, chest pain, headaches, malaise/fatigue, neck pain, orthopnea, palpitations, peripheral edema, PND, shortness of breath or sweats. There are no associated agents to hypertension. There are no known risk factors for coronary artery disease. Past treatments include diuretics. The current treatment provides mild improvement.  Cough  This is a recurrent problem. The current episode started more than 1 year ago. The problem has been gradually improving. The problem occurs constantly. The cough is non-productive. Associated symptoms include a sore throat. Pertinent negatives include no chest pain, chills, ear pain, fever, headaches, heartburn, myalgias, rash, shortness of breath, sweats, weight loss or wheezing. Treatments tried: stopped ace medication. There is no history of environmental allergies.  Sinusitis  This is a new problem. The current episode started in the past 7 days. The problem has been gradually worsening since onset. There has been no fever. The pain is mild. Associated symptoms include congestion, coughing, sinus pressure, sneezing and a sore throat. Pertinent negatives include no chills, diaphoresis, ear pain, headaches, hoarse voice, neck pain, shortness of breath or swollen glands. Past treatments include nothing.    No problem-specific Assessment & Plan notes found for this encounter.   Past Medical History:  Diagnosis Date  . Hypertension     History reviewed. No pertinent surgical history.  History reviewed. No  pertinent family history.  Social History   Socioeconomic History  . Marital status: Unknown    Spouse name: Not on file  . Number of children: Not on file  . Years of education: Not on file  . Highest education level: Not on file  Occupational History  . Not on file  Social Needs  . Financial resource strain: Not on file  . Food insecurity:    Worry: Not on file    Inability: Not on file  . Transportation needs:    Medical: Not on file    Non-medical: Not on file  Tobacco Use  . Smoking status: Never Smoker  . Smokeless tobacco: Never Used  Substance and Sexual Activity  . Alcohol use: No    Alcohol/week: 0.0 oz  . Drug use: No  . Sexual activity: Not on file  Lifestyle  . Physical activity:    Days per week: Not on file    Minutes per session: Not on file  . Stress: Not on file  Relationships  . Social connections:    Talks on phone: Not on file    Gets together: Not on file    Attends religious service: Not on file    Active member of club or organization: Not on file    Attends meetings of clubs or organizations: Not on file    Relationship status: Not on file  . Intimate partner violence:    Fear of current or ex partner: Not on file    Emotionally abused: Not on file    Physically abused: Not on file    Forced sexual activity: Not on file  Other Topics Concern  . Not on  file  Social History Narrative  . Not on file    No Known Allergies  Outpatient Medications Prior to Visit  Medication Sig Dispense Refill  . albuterol (PROVENTIL HFA;VENTOLIN HFA) 108 (90 Base) MCG/ACT inhaler Inhale 2 puffs into the lungs every 6 (six) hours as needed for wheezing or shortness of breath. 1 Inhaler 0  . hydrochlorothiazide (HYDRODIURIL) 12.5 MG tablet Take 1 tablet (12.5 mg total) by mouth daily. 30 tablet 1  . clotrimazole-betamethasone (LOTRISONE) cream Apply 1 application topically 2 (two) times daily. (Patient not taking: Reported on 07/06/2017) 30 g 0  .  amoxicillin-clavulanate (AUGMENTIN) 875-125 MG tablet Take 1 tablet by mouth 2 (two) times daily. 20 tablet 0  . guaiFENesin-codeine (ROBITUSSIN AC) 100-10 MG/5ML syrup Take 5 mLs by mouth 3 (three) times daily as needed for cough. 150 mL 0   No facility-administered medications prior to visit.     Review of Systems  Constitutional: Negative for chills, diaphoresis, fever, malaise/fatigue and weight loss.  HENT: Positive for congestion, sinus pressure, sneezing and sore throat. Negative for ear discharge, ear pain and hoarse voice.   Eyes: Negative for blurred vision.  Respiratory: Positive for cough. Negative for sputum production, shortness of breath and wheezing.   Cardiovascular: Negative for chest pain, palpitations, orthopnea, leg swelling and PND.  Gastrointestinal: Negative for abdominal pain, blood in stool, constipation, diarrhea, heartburn, melena and nausea.  Genitourinary: Negative for dysuria, frequency, hematuria and urgency.  Musculoskeletal: Negative for back pain, joint pain, myalgias and neck pain.  Skin: Negative for rash.  Neurological: Negative for dizziness, tingling, sensory change, focal weakness and headaches.  Endo/Heme/Allergies: Negative for environmental allergies and polydipsia. Does not bruise/bleed easily.  Psychiatric/Behavioral: Negative for depression and suicidal ideas. The patient is not nervous/anxious and does not have insomnia.      Objective  Vitals:   10/05/17 1531  BP: (!) 140/92  Pulse: 86    Physical Exam  Constitutional: She is oriented to person, place, and time. She appears well-developed and well-nourished.  HENT:  Head: Normocephalic.  Right Ear: Tympanic membrane, external ear and ear canal normal.  Left Ear: Tympanic membrane, external ear and ear canal normal.  Nose: No mucosal edema. Right sinus exhibits maxillary sinus tenderness. Right sinus exhibits no frontal sinus tenderness. Left sinus exhibits no maxillary sinus  tenderness and no frontal sinus tenderness.  Mouth/Throat: Oropharynx is clear and moist.  Eyes: Pupils are equal, round, and reactive to light. Conjunctivae and EOM are normal. Lids are everted and swept, no foreign bodies found. Left eye exhibits no hordeolum. No foreign body present in the left eye. Right conjunctiva is not injected. Left conjunctiva is not injected. No scleral icterus.  Neck: Normal range of motion. Neck supple. No JVD present. No tracheal deviation present. No thyromegaly present.  Cardiovascular: Normal rate, regular rhythm, normal heart sounds and intact distal pulses. Exam reveals no gallop and no friction rub.  No murmur heard. Pulmonary/Chest: Effort normal and breath sounds normal. No respiratory distress. She has no wheezes. She has no rales.  Abdominal: Soft. Bowel sounds are normal. She exhibits no mass. There is no hepatosplenomegaly. There is no tenderness. There is no rebound and no guarding.  Musculoskeletal: Normal range of motion. She exhibits no edema or tenderness.  Lymphadenopathy:    She has no cervical adenopathy.  Neurological: She is alert and oriented to person, place, and time. She has normal strength. She displays normal reflexes. No cranial nerve deficit.  Skin: Skin is warm. No  rash noted.  Psychiatric: She has a normal mood and affect. Her mood appears not anxious. She does not exhibit a depressed mood.  Nursing note and vitals reviewed.     Assessment & Plan  Problem List Items Addressed This Visit    None    Visit Diagnoses    Acute maxillary sinusitis, recurrence not specified    -  Primary   Relevant Medications   azithromycin (ZITHROMAX) 250 MG tablet   Essential hypertension       Relevant Medications   hydrochlorothiazide (HYDRODIURIL) 12.5 MG tablet   amLODipine (NORVASC) 5 MG tablet      Meds ordered this encounter  Medications  . hydrochlorothiazide (HYDRODIURIL) 12.5 MG tablet    Sig: Take 1 tablet (12.5 mg total) by  mouth daily.    Dispense:  30 tablet    Refill:  5  . amLODipine (NORVASC) 5 MG tablet    Sig: Take 1 tablet (5 mg total) by mouth daily.    Dispense:  30 tablet    Refill:  5  . azithromycin (ZITHROMAX) 250 MG tablet    Sig: 2 today then 1 a day for 4 days    Dispense:  6 tablet    Refill:  0      Dr. [facility]  10/05/17

## 2017-10-09 ENCOUNTER — Telehealth: Payer: Self-pay

## 2017-10-09 ENCOUNTER — Other Ambulatory Visit: Payer: Self-pay

## 2017-10-09 MED ORDER — BENZONATATE 100 MG PO CAPS: 100.0000 mg | ORAL_CAPSULE | Freq: Two times a day (BID) | ORAL | 0 refills | Status: DC | PRN

## 2017-10-09 MED ORDER — AMOXICILLIN-POT CLAVULANATE 875-125 MG PO TABS: 1.0000 | ORAL_TABLET | Freq: Two times a day (BID) | ORAL | 0 refills | Status: DC

## 2017-10-09 NOTE — Telephone Encounter (Signed)
Pt called in with "not any better, still green production and bad cough" Switched to Augmentin and tessalon perles- [facility] [place]

## 2018-04-06 ENCOUNTER — Ambulatory Visit: Payer: BC Managed Care – PPO | Admitting: Family Medicine

## 2018-04-06 ENCOUNTER — Encounter: Payer: Self-pay | Admitting: Family Medicine

## 2018-04-06 VITALS — BP 127/83 | HR 74 | Temp 98.0°F | Resp 16 | Ht 67.0 in | Wt 267.0 lb

## 2018-04-06 DIAGNOSIS — J219 Acute bronchiolitis, unspecified: Secondary | ICD-10-CM

## 2018-04-06 DIAGNOSIS — I10 Essential (primary) hypertension: Secondary | ICD-10-CM

## 2018-04-06 DIAGNOSIS — J4521 Mild intermittent asthma with (acute) exacerbation: Secondary | ICD-10-CM | POA: Diagnosis not present

## 2018-04-06 DIAGNOSIS — J0100 Acute maxillary sinusitis, unspecified: Secondary | ICD-10-CM

## 2018-04-06 MED ORDER — GUAIFENESIN-CODEINE 100-10 MG/5ML PO SYRP: 5.0000 mL | ORAL_SOLUTION | Freq: Three times a day (TID) | ORAL | 0 refills | Status: DC | PRN

## 2018-04-06 MED ORDER — ALBUTEROL SULFATE HFA 108 (90 BASE) MCG/ACT IN AERS: 2.0000 | INHALATION_SPRAY | Freq: Four times a day (QID) | RESPIRATORY_TRACT | 11 refills | Status: DC | PRN

## 2018-04-06 MED ORDER — AMOXICILLIN-POT CLAVULANATE 875-125 MG PO TABS: 1.0000 | ORAL_TABLET | Freq: Two times a day (BID) | ORAL | 0 refills | Status: DC

## 2018-04-06 MED ORDER — AMLODIPINE BESYLATE 5 MG PO TABS: 5.0000 mg | ORAL_TABLET | Freq: Every day | ORAL | 1 refills | Status: DC

## 2018-04-06 MED ORDER — HYDROCHLOROTHIAZIDE 12.5 MG PO TABS: 12.5000 mg | ORAL_TABLET | Freq: Every day | ORAL | 1 refills | Status: DC

## 2018-04-06 NOTE — Progress Notes (Signed)
Date:  04/06/2018   Name:  Theresa Best   DOB:  [birth year]   MRN:  [id number]   Chief Complaint: Hypertension and Cough (bronchitis like cough 3 days ) Hypertension  This is a chronic problem. The current episode started more than 1 year ago. The problem has been waxing and waning since onset. The problem is controlled. Pertinent negatives include no anxiety, blurred vision, chest pain, headaches, malaise/fatigue, neck pain, orthopnea, palpitations, peripheral edema, PND, shortness of breath or sweats. There are no associated agents to hypertension. There are no known risk factors for coronary artery disease. Past treatments include calcium channel blockers and diuretics. The current treatment provides moderate improvement. There are no compliance problems.  There is no history of angina, kidney disease, CAD/MI, CVA, heart failure, left ventricular hypertrophy, PVD or retinopathy. There is no history of chronic renal disease, a hypertension causing med or renovascular disease.  Cough  This is a new problem. The current episode started in the past 7 days. The problem has been gradually worsening. The cough is productive of purulent sputum (green). Pertinent negatives include no chest pain, chills, ear congestion, ear pain, eye redness, fever, headaches, heartburn, hemoptysis, myalgias, nasal congestion, postnasal drip, rash, rhinorrhea, sore throat, shortness of breath, sweats, weight loss or wheezing. She has tried a beta-agonist inhaler for the symptoms. The treatment provided mild relief. There is no history of environmental allergies.  Sinusitis  The current episode started more than 1 year ago. The problem has been gradually worsening since onset. The pain is mild. Associated symptoms include congestion, coughing and sinus pressure. Pertinent negatives include no chills, diaphoresis, ear pain, headaches, hoarse voice, neck pain, shortness of breath, sneezing, sore throat or swollen glands.  (Prod drainage) Past treatments include oral decongestants.     Review of Systems  Constitutional: Negative.  Negative for chills, diaphoresis, fatigue, fever, malaise/fatigue, unexpected weight change and weight loss.  HENT: Positive for congestion and sinus pressure. Negative for ear discharge, ear pain, hoarse voice, postnasal drip, rhinorrhea, sneezing and sore throat.   Eyes: Negative for blurred vision, photophobia, pain, discharge, redness and itching.  Respiratory: Positive for cough. Negative for hemoptysis, shortness of breath, wheezing and stridor.   Cardiovascular: Negative for chest pain, palpitations, orthopnea and PND.  Gastrointestinal: Negative for abdominal pain, blood in stool, constipation, diarrhea, heartburn, nausea and vomiting.  Endocrine: Negative for cold intolerance, heat intolerance, polydipsia, polyphagia and polyuria.  Genitourinary: Negative for dysuria, flank pain, frequency, hematuria, menstrual problem, pelvic pain, urgency, vaginal bleeding and vaginal discharge.  Musculoskeletal: Negative for arthralgias, back pain, myalgias and neck pain.  Skin: Negative for rash.  Allergic/Immunologic: Negative for environmental allergies and food allergies.  Neurological: Negative for dizziness, weakness, light-headedness, numbness and headaches.  Hematological: Negative for adenopathy. Does not bruise/bleed easily.  Psychiatric/Behavioral: Negative for dysphoric mood. The patient is not nervous/anxious.     Patient Active Problem List   Diagnosis Date Noted  . Essential hypertension 11/03/2016  . Class 3 severe obesity due to excess calories with serious comorbidity and body mass index (BMI) of 45.0 to 49.9 in adult [facility]) 11/03/2016    No Known Allergies  History reviewed. No pertinent surgical history.  Social History   Tobacco Use  . Smoking status: Never Smoker  . Smokeless tobacco: Never Used  Substance Use Topics  . Alcohol use: No    Alcohol/week:  0.0 standard drinks  . Drug use: No     Medication list has been reviewed and updated.  Current Meds  Medication Sig  . albuterol (PROVENTIL HFA;VENTOLIN HFA) 108 (90 Base) MCG/ACT inhaler Inhale 2 puffs into the lungs every 6 (six) hours as needed for wheezing or shortness of breath.  Marland Kitchen amLODipine (NORVASC) 5 MG tablet Take 1 tablet (5 mg total) by mouth daily.  Marland Kitchen amoxicillin-clavulanate (AUGMENTIN) 875-125 MG tablet Take 1 tablet by mouth 2 (two) times daily.  . hydrochlorothiazide (HYDRODIURIL) 12.5 MG tablet Take 1 tablet (12.5 mg total) by mouth daily.  . [DISCONTINUED] albuterol (PROVENTIL HFA;VENTOLIN HFA) 108 (90 Base) MCG/ACT inhaler Inhale 2 puffs into the lungs every 6 (six) hours as needed for wheezing or shortness of breath.  . [DISCONTINUED] amLODipine (NORVASC) 5 MG tablet Take 1 tablet (5 mg total) by mouth daily.  . [DISCONTINUED] amoxicillin-clavulanate (AUGMENTIN) 875-125 MG tablet Take 1 tablet by mouth 2 (two) times daily.  . [DISCONTINUED] hydrochlorothiazide (HYDRODIURIL) 12.5 MG tablet Take 1 tablet (12.5 mg total) by mouth daily.    PHQ 2/9 Scores 07/06/2017  PHQ - 2 Score 0  PHQ- 9 Score 0    Physical Exam  Constitutional: She is oriented to person, place, and time. She appears well-developed and well-nourished.  HENT:  Head: Normocephalic.  Right Ear: External ear normal.  Left Ear: External ear normal.  Mouth/Throat: Oropharynx is clear and moist.  Eyes: Pupils are equal, round, and reactive to light. Conjunctivae and EOM are normal. Lids are everted and swept, no foreign bodies found. Left eye exhibits no hordeolum. No foreign body present in the left eye. Right conjunctiva is not injected. Left conjunctiva is not injected. No scleral icterus.  Neck: Normal range of motion. Neck supple. No JVD present. No tracheal deviation present. No thyromegaly present.  Cardiovascular: Normal rate, regular rhythm, normal heart sounds and intact distal pulses. Exam  reveals no gallop and no friction rub.  No murmur heard. Pulmonary/Chest: Effort normal and breath sounds normal. No respiratory distress. She has no wheezes. She has no rales.  Abdominal: Soft. Bowel sounds are normal. She exhibits no mass. There is no hepatosplenomegaly. There is no tenderness. There is no rebound and no guarding.  Musculoskeletal: Normal range of motion. She exhibits no edema or tenderness.  Lymphadenopathy:    She has no cervical adenopathy.  Neurological: She is alert and oriented to person, place, and time. She has normal strength. She displays normal reflexes. No cranial nerve deficit.  Skin: Skin is warm. No rash noted.  Psychiatric: She has a normal mood and affect. Her mood appears not anxious. She does not exhibit a depressed mood.  Nursing note and vitals reviewed.   BP 127/83   Pulse 74   Temp 98 F (36.7 C)   Resp 16   Ht 5\' 7"  (1.702 m)   Wt 267 lb (121.1 kg)   SpO2 98%   BMI 41.82 kg/m   Assessment and Plan:  1. Bronchiolitis Acute Persistent. Refill albuterol and robitussin AC prn cough. - albuterol (PROVENTIL HFA;VENTOLIN HFA) 108 (90 Base) MCG/ACT inhaler; Inhale 2 puffs into the lungs every 6 (six) hours as needed for wheezing or shortness of breath.  Dispense: 1 Inhaler; Refill: 11 - guaiFENesin-codeine (ROBITUSSIN AC) 100-10 MG/5ML syrup; Take 5 mLs by mouth 3 (three) times daily as needed for cough.  Dispense: 100 mL; Refill: 0  2. Mild intermittent reactive airway disease with acute exacerbation Mild Intermitant. Resume albuterol prn cough/wheezing. - albuterol (PROVENTIL HFA;VENTOLIN HFA) 108 (90 Base) MCG/ACT inhaler; Inhale 2 puffs into the lungs every 6 (six) hours as  needed for wheezing or shortness of breath.  Dispense: 1 Inhaler; Refill: 11  3. Essential hypertension Chronic Controlled. Continue amlodipine and HCTZ 12.5 daily - amLODipine (NORVASC) 5 MG tablet; Take 1 tablet (5 mg total) by mouth daily.  Dispense: 90 tablet; Refill:  1 - hydrochlorothiazide (HYDRODIURIL) 12.5 MG tablet; Take 1 tablet (12.5 mg total) by mouth daily.  Dispense: 90 tablet; Refill: 1 - Renal Function Panel - guaiFENesin-codeine (ROBITUSSIN AC) 100-10 MG/5ML syrup; Take 5 mLs by mouth 3 (three) times daily as needed for cough.  Dispense: 100 mL; Refill: 0  4. Acute maxillary sinusitis, recurrence not specified Acute Prescribe augmentin 875 mg bid for 10 days. - amoxicillin-clavulanate (AUGMENTIN) 875-125 MG tablet; Take 1 tablet by mouth 2 (two) times daily.  Dispense: 20 tablet; Refill: 0   Dr. [facility]  04/06/2018

## 2018-07-09 ENCOUNTER — Encounter: Payer: Self-pay | Admitting: Family Medicine

## 2018-07-09 ENCOUNTER — Ambulatory Visit
Admission: RE | Admit: 2018-07-09 | Discharge: 2018-07-09 | Disposition: A | Discharge status: Home / Self Care | Payer: BC Managed Care – PPO | Source: Ambulatory Visit | Attending: Family Medicine | Admitting: Family Medicine

## 2018-07-09 ENCOUNTER — Other Ambulatory Visit (HOSPITAL_COMMUNITY)
Admission: RE | Admit: 2018-07-09 | Discharge: 2018-07-09 | Disposition: A | Discharge status: Home / Self Care | Payer: BC Managed Care – PPO | Source: Ambulatory Visit | Attending: Family Medicine | Admitting: Family Medicine

## 2018-07-09 ENCOUNTER — Ambulatory Visit (INDEPENDENT_AMBULATORY_CARE_PROVIDER_SITE_OTHER): Payer: BC Managed Care – PPO | Admitting: Family Medicine

## 2018-07-09 VITALS — BP 130/88 | HR 80 | Ht 67.0 in | Wt 270.0 lb

## 2018-07-09 DIAGNOSIS — Z6841 Body Mass Index (BMI) 40.0 and over, adult: Secondary | ICD-10-CM

## 2018-07-09 DIAGNOSIS — J0100 Acute maxillary sinusitis, unspecified: Secondary | ICD-10-CM | POA: Diagnosis not present

## 2018-07-09 DIAGNOSIS — Z1239 Encounter for other screening for malignant neoplasm of breast: Secondary | ICD-10-CM | POA: Diagnosis present

## 2018-07-09 DIAGNOSIS — Z1211 Encounter for screening for malignant neoplasm of colon: Secondary | ICD-10-CM

## 2018-07-09 DIAGNOSIS — N814 Uterovaginal prolapse, unspecified: Secondary | ICD-10-CM

## 2018-07-09 DIAGNOSIS — I10 Essential (primary) hypertension: Secondary | ICD-10-CM

## 2018-07-09 DIAGNOSIS — Z124 Encounter for screening for malignant neoplasm of cervix: Secondary | ICD-10-CM | POA: Insufficient documentation

## 2018-07-09 MED ORDER — AMOXICILLIN-POT CLAVULANATE 875-125 MG PO TABS: 1.0000 | ORAL_TABLET | Freq: Two times a day (BID) | ORAL | 0 refills | Status: DC

## 2018-07-09 NOTE — Progress Notes (Addendum)
Date:  07/09/2018   Name:  Theresa Best   DOB:  Feb 02, 1961   MRN:  [id number]   Chief Complaint: Annual Exam (needs colonoscopy, mammo, and pap) and Sinusitis (cong)  Patient is a 58 year old female who presents for a comprehensive physical exam. The patient reports the following problems: sinusitis. Health maintenance has been reviewed to schedule colonoscopy, mammography, and pap/today  Sinusitis  This is a new problem. The current episode started in the past 7 days. The problem has been gradually worsening since onset. There has been no fever. Her pain is at a severity of 4/10. The pain is mild. Pertinent negatives include no chills, congestion, coughing, diaphoresis, ear pain, headaches, hoarse voice, neck pain, shortness of breath, sinus pressure, sneezing, sore throat or swollen glands. Past treatments include oral decongestants. The treatment provided moderate relief.    Review of Systems  Constitutional: Negative.  Negative for chills, diaphoresis, fatigue, fever and unexpected weight change.  HENT: Negative for congestion, ear discharge, ear pain, hoarse voice, rhinorrhea, sinus pressure, sneezing and sore throat.   Eyes: Negative for photophobia, pain, discharge, redness and itching.  Respiratory: Negative for cough, shortness of breath, wheezing and stridor.   Gastrointestinal: Negative for abdominal pain, blood in stool, constipation, diarrhea, nausea and vomiting.  Endocrine: Negative for cold intolerance, heat intolerance, polydipsia, polyphagia and polyuria.  Genitourinary: Negative for dysuria, flank pain, frequency, hematuria, menstrual problem, pelvic pain, urgency, vaginal bleeding and vaginal discharge.  Musculoskeletal: Negative for arthralgias, back pain, myalgias and neck pain.  Skin: Negative for rash.  Allergic/Immunologic: Negative for environmental allergies and food allergies.  Neurological: Negative for dizziness, weakness, light-headedness, numbness and  headaches.  Hematological: Negative for adenopathy. Does not bruise/bleed easily.  Psychiatric/Behavioral: Negative for dysphoric mood. The patient is not nervous/anxious.     Patient Active Problem List   Diagnosis Date Noted  . Essential hypertension 11/03/2016  . Class 3 severe obesity due to excess calories with serious comorbidity and body mass index (BMI) of 45.0 to 49.9 in adult [facility]) 11/03/2016    No Known Allergies  History reviewed. No pertinent surgical history.  Social History   Tobacco Use  . Smoking status: Never Smoker  . Smokeless tobacco: Never Used  Substance Use Topics  . Alcohol use: No    Alcohol/week: 0.0 standard drinks  . Drug use: No     Medication list has been reviewed and updated.  Current Meds  Medication Sig  . albuterol (PROVENTIL HFA;VENTOLIN HFA) 108 (90 Base) MCG/ACT inhaler Inhale 2 puffs into the lungs every 6 (six) hours as needed for wheezing or shortness of breath.  Marland Kitchen amLODipine (NORVASC) 5 MG tablet Take 1 tablet (5 mg total) by mouth daily.  . hydrochlorothiazide (HYDRODIURIL) 12.5 MG tablet Take 1 tablet (12.5 mg total) by mouth daily.    PHQ 2/9 Scores 07/06/2017  PHQ - 2 Score 0  PHQ- 9 Score 0    Physical Exam Vitals signs and nursing note reviewed. Exam conducted with a chaperone present.  Constitutional:      General: She is not in acute distress.    Appearance: Normal appearance. She is not diaphoretic.  HENT:     Head: Normocephalic and atraumatic.     Right Ear: Hearing, tympanic membrane, ear canal and external ear normal.     Left Ear: Hearing, tympanic membrane, ear canal and external ear normal.     Nose: Nose normal.     Right Turbinates: Not enlarged or swollen.  Left Turbinates: Not enlarged or swollen.     Right Sinus: No maxillary sinus tenderness or frontal sinus tenderness.     Left Sinus: No maxillary sinus tenderness or frontal sinus tenderness.     Mouth/Throat:     Dentition: Normal dentition.    Eyes:     General: Lids are normal.        Right eye: No discharge.        Left eye: No discharge.     Extraocular Movements: Extraocular movements intact.     Conjunctiva/sclera: Conjunctivae normal.     Pupils: Pupils are equal, round, and reactive to light.  Neck:     Musculoskeletal: Normal range of motion and neck supple. No edema.     Thyroid: No thyroid mass, thyromegaly or thyroid tenderness.     Vascular: No JVD.  Cardiovascular:     Rate and Rhythm: Normal rate and regular rhythm.     Chest Wall: PMI is not displaced. No thrill.     Heart sounds: Normal heart sounds, S1 normal and S2 normal. Heart sounds not distant. No murmur. No systolic murmur. No diastolic murmur. No friction rub. No gallop. No S3 or S4 sounds.   Pulmonary:     Effort: Pulmonary effort is normal.     Breath sounds: Normal breath sounds. No decreased breath sounds, wheezing, rhonchi or rales.  Chest:     Breasts:        Right: Normal. No swelling, bleeding, inverted nipple, mass, nipple discharge, skin change or tenderness.        Left: Normal. No swelling, bleeding, inverted nipple, mass, nipple discharge, skin change or tenderness.  Abdominal:     General: Bowel sounds are normal.     Palpations: Abdomen is soft. There is no hepatomegaly, splenomegaly or mass.     Tenderness: There is no abdominal tenderness. There is no right CVA tenderness, left CVA tenderness or guarding.     Hernia: There is no hernia in the right inguinal area.  Genitourinary:    General: Normal vulva.     Labia:        Right: No rash, tenderness or lesion.        Left: No rash, tenderness or lesion.      Vagina: Normal.     Cervix: Normal and dilated.     Uterus: With uterine prolapse.      Adnexa: Right adnexa normal and left adnexa normal.       Right: No mass, tenderness or fullness.         Left: No mass, tenderness or fullness.       Rectum: Normal. Guaiac result negative.  Musculoskeletal: Normal range of motion.      Right lower leg: No edema.     Left lower leg: No edema.  Lymphadenopathy:     Cervical: No cervical adenopathy.     Right cervical: No superficial cervical adenopathy.    Left cervical: No superficial cervical adenopathy.     Upper Body:     Right upper body: No axillary adenopathy.     Left upper body: No axillary adenopathy.     Lower Body: No right inguinal adenopathy. No left inguinal adenopathy.  Skin:    General: Skin is warm and dry.  Neurological:     Mental Status: She is alert.     Cranial Nerves: Cranial nerves are intact.     Sensory: Sensation is intact.     Motor: Motor function is intact.  Deep Tendon Reflexes: Reflexes are normal and symmetric.     BP 130/88   Pulse 80   Ht 5\' 7"  (1.702 m)   Wt 270 lb (122.5 kg)   BMI 42.29 kg/m    Assessment and Plan: 1. Acute maxillary sinusitis, recurrence not specified Acute.  Persistent.  Initiate Augmentin 875 mg twice a day for 10 days.   - amoxicillin-clavulanate (AUGMENTIN) 875-125 MG tablet; Take 1 tablet by mouth 2 (two) times daily.  Dispense: 20 tablet; Refill: 0  2. Screening for cervical cancer Cervical cancer screening with Pap/HPV done.  ASCCP guidelines were asked - Cytology - PAP  3. Screen for colon cancer Cancer screening was discussed with patient and referral was made to gastroenterology. - Ambulatory referral to Gastroenterology  4. Screening for breast cancer We will breast exam was done during the physical 3D mammogram was scheduled. - MM 3D SCREEN BREAST BILATERAL; Future  5. Class 3 severe obesity due to excess calories with serious comorbidity and body mass index (BMI) of 40.0 to 44.9 in adult [facility]) Patient's weight was discussed and lipid panel will be checked. - Lipid panel  6. Prolapsed uterus Patient was noted to have a prolapsed uterus which is symptomatic with daytime and nocturnal frequency.  Patient was referred to GYN for evaluation and treatment. - Ambulatory referral to  Obstetrics / Gynecology  7. Essential hypertension Patient will continue amlodipine 5 mg and hydrochlorothiazide 12.5 mg.  Will check renal function panel. - Renal Function Panel  8. Annual physical exam patient underwent annual physical exam with Pap and pelvic.  There was no concerns otherwise noted above in history or during the physical.

## 2018-07-09 NOTE — Patient Instructions (Signed)

## 2018-07-10 ENCOUNTER — Telehealth: Payer: Self-pay

## 2018-07-10 ENCOUNTER — Other Ambulatory Visit: Payer: Self-pay

## 2018-07-10 DIAGNOSIS — Z1211 Encounter for screening for malignant neoplasm of colon: Secondary | ICD-10-CM

## 2018-07-10 LAB — LIPID PANEL
Chol/HDL Ratio: 5.3 ratio — ABNORMAL HIGH (ref 0.0–4.4)
Cholesterol, Total: 254 mg/dL — ABNORMAL HIGH (ref 100–199)
HDL: 48 mg/dL (ref >39)
LDL Calculated: 183 mg/dL — ABNORMAL HIGH (ref 0–99)
Triglycerides: 115 mg/dL (ref 0–149)
VLDL Cholesterol Cal: 23 mg/dL (ref 5–40)

## 2018-07-10 LAB — RENAL FUNCTION PANEL
Albumin: 4.3 g/dL (ref 3.8–4.9)
BUN/Creatinine Ratio: 29 — ABNORMAL HIGH (ref 9–23)
BUN: 16 mg/dL (ref 6–24)
CALCIUM: 9.8 mg/dL (ref 8.7–10.2)
CO2: 22 mmol/L (ref 20–29)
CREATININE: 0.55 mg/dL — AB (ref 0.57–1.00)
Chloride: 103 mmol/L (ref 96–106)
GFR calc Af Amer: 121 mL/min/{1.73_m2} (ref >59)
GFR calc non Af Amer: 105 mL/min/{1.73_m2} (ref >59)
Glucose: 103 mg/dL — ABNORMAL HIGH (ref 65–99)
Phosphorus: 3.8 mg/dL (ref 3.0–4.3)
Potassium: 4.3 mmol/L (ref 3.5–5.2)
Sodium: 141 mmol/L (ref 134–144)

## 2018-07-10 MED ORDER — NA SULFATE-K SULFATE-MG SULF 17.5-3.13-1.6 GM/177ML PO SOLN: 1.0000 | Freq: Once | ORAL | 0 refills | Status: AC

## 2018-07-10 NOTE — Telephone Encounter (Signed)
Pt is calling to schedule a colonoscopy  °

## 2018-07-10 NOTE — Telephone Encounter (Signed)
Patients call has been returned.  She has been scheduled for her colonoscopy at [facility] with Dr. Servando Snare on 07/23/18.  Thanks [employer]

## 2018-07-11 ENCOUNTER — Encounter: Payer: Self-pay | Admitting: Obstetrics and Gynecology

## 2018-07-11 ENCOUNTER — Ambulatory Visit (INDEPENDENT_AMBULATORY_CARE_PROVIDER_SITE_OTHER): Payer: BC Managed Care – PPO | Admitting: Obstetrics and Gynecology

## 2018-07-11 VITALS — BP 185/93 | HR 80 | Wt 278.0 lb

## 2018-07-11 DIAGNOSIS — N813 Complete uterovaginal prolapse: Secondary | ICD-10-CM

## 2018-07-11 DIAGNOSIS — N393 Stress incontinence (female) (male): Secondary | ICD-10-CM | POA: Diagnosis not present

## 2018-07-11 NOTE — Progress Notes (Signed)
Obstetrics & Gynecology Office Visit   Chief Complaint:  Chief Complaint  Patient presents with  . Vaginal Prolapse    Referred by [facility]   . History of Present Illness:  Theresa Best is a 58 y.o. female who presents in consultation at the request of Duanne LimerickJones, Deanna C, MD for evaluation of uterine prolapse.   She reports that she has had the above symptoms for several years but have grown more prominent.  Urinary Leakage Symptoms:  She reports that she does have urinary incontinence.  She does wear a pad.    Stress incontinence:  She does report stress urinary leakage and reports this to be severe bother.    Urgency incontinence: She does not report urine leakage with urgency   She does not report excessive caffeinated beverages intake  Bladder Emptying/Upper Urinary Tract Symptoms:    Bladder emptying: She  She does occasionally splint and performs position changes to accomplish voiding.   Does report the need to double void   Upper tract symptoms: Shedenies a history of kidney stones and denies gross hematuria.   UTI: She denies a history of frequent urinary tract infections.  Bulge Symptoms:  She admits to pressure/bulge symptoms.    Bowel Symptoms:  Does report occasional issues with constipation   Fecal incontinence: She denies encopresis    Defecatory dysfunction: She does not splint to accomplish a bowel movement.    Pelvis tested to 7lbs 12oz, did require vacuum application with G1 pregnancy, reports a more significant teat with G2 pregnancy.  She does have chronic cough.  Review of Systems: Review of Systems  Constitutional: Negative.   Respiratory: Positive for cough.   Gastrointestinal: Positive for constipation. Negative for abdominal pain, blood in stool, diarrhea, heartburn, melena, nausea and vomiting.  Genitourinary: Positive for frequency. Negative for dysuria, flank pain, hematuria and urgency.  Skin: Negative for itching and rash.    Neurological: Negative.     Past Medical History:  Past Medical History:  Diagnosis Date  . Hypertension     Past Surgical History:  History reviewed. No pertinent surgical history.  Gynecologic History: No LMP recorded. Patient is postmenopausal.  Obstetric History: No obstetric history on file.  Family History:  Family History  Problem Relation Age of Onset  . Breast cancer Neg Hx     Social History:  Social History   Socioeconomic History  . Marital status: Unknown    Spouse name: Not on file  . Number of children: Not on file  . Years of education: Not on file  . Highest education level: Not on file  Occupational History  . Not on file  Social Needs  . Financial resource strain: Not on file  . Food insecurity:    Worry: Not on file    Inability: Not on file  . Transportation needs:    Medical: Not on file    Non-medical: Not on file  Tobacco Use  . Smoking status: Never Smoker  . Smokeless tobacco: Never Used  Substance and Sexual Activity  . Alcohol use: No    Alcohol/week: 0.0 standard drinks  . Drug use: No  . Sexual activity: Not on file  Lifestyle  . Physical activity:    Days per week: Not on file    Minutes per session: Not on file  . Stress: Not on file  Relationships  . Social connections:    Talks on phone: Not on file    Gets together: Not on file  Attends religious service: Not on file    Active member of club or organization: Not on file    Attends meetings of clubs or organizations: Not on file    Relationship status: Not on file  . Intimate partner violence:    Fear of current or ex partner: Not on file    Emotionally abused: Not on file    Physically abused: Not on file    Forced sexual activity: Not on file  Other Topics Concern  . Not on file  Social History Narrative  . Not on file    Allergies:  No Known Allergies  Medications: Prior to Admission medications   Medication Sig Start Date End Date Taking?  Authorizing Provider  albuterol (PROVENTIL HFA;VENTOLIN HFA) 108 (90 Base) MCG/ACT inhaler Inhale 2 puffs into the lungs every 6 (six) hours as needed for wheezing or shortness of breath. 04/06/18   Duanne LimerickJones, Deanna C, MD  amLODipine (NORVASC) 5 MG tablet Take 1 tablet (5 mg total) by mouth daily. 04/06/18   Duanne LimerickJones, Deanna C, MD  amoxicillin-clavulanate (AUGMENTIN) 875-125 MG tablet Take 1 tablet by mouth 2 (two) times daily. 07/09/18   Duanne LimerickJones, Deanna C, MD  hydrochlorothiazide (HYDRODIURIL) 12.5 MG tablet Take 1 tablet (12.5 mg total) by mouth daily. 04/06/18   Duanne LimerickJones, Deanna C, MD    Physical Exam Vitals:  Vitals:   07/11/18 1457  BP: (!) 185/93  Pulse: 80   No LMP recorded. Patient is postmenopausal.  General: NAD HEENT: normocephalic, anicteric Pulmonary: No increased work of breathing Genitourinary:  External: Normal external female genitalia.  Normal urethral meatus, normal Bartholin's and Skene's glands.    Vagina: Normal vaginal mucosa, supporting the anterior and posterior wall separately there is not a significant degree of cystocele or rectocele appreciated with the majority of prolapase confine to the apical compartment.  Cervix: IS at the level of the introitus, otherwise grossly normal in appearance, no bleeding, small cervical polyp noted  Uterus: Non-enlarged, mobile, normal contour.  No CMT  Adnexa: ovaries non-enlarged, no adnexal masses  Rectal: deferred  Lymphatic: no evidence of inguinal lymphadenopathy Extremities: no edema, erythema, or tenderness Neurologic: Grossly intact Psychiatric: mood appropriate, affect full  PVR: not obtained  Female chaperone present for pelvic portions of the physical exam  Assessment: 58 y.o. seen in consultation for uterine prolapse  Plan: Problem List Items Addressed This Visit    None    Visit Diagnoses    Procidentia of uterus    -  Primary   Relevant Orders   Ambulatory referral to Urogynecology   SUI (stress urinary  incontinence, female)       Relevant Orders   Ambulatory referral to Urogynecology     1) Uterine prolapse - patient with fairly significant uterine prolapse with associated SUI.  Pessaries are one treatment option but can be prohipitive in patients who are still sexually active.  She would like required a larger Gelhorn pessary which is not easily self managed.  We discussed that while hysterectomy would remove the majority of the prolapse, it is ideally done with a concurrent suspensory procedure such as sacrospinous ligament fixation vs sacrocolpopexy which I do not perform.  The former can be done via a purely vaginal approach while the later would require and abdominal vs robotic approach.  In addition an anterior/posterior repair is often warranted.  For some patient this procedure may restore normal bladder function but TVT or TOT may be required as for some patient SUI may worsen following  hysterectomy.   - referral to [facility] urogynecology  2) A total of 15 minutes were spent in face-to-face contact with the patient during this encounter with over half of that time devoted to counseling and coordination of care.  3) No follow-ups on file.    Vena Austria, MD, [facility], [facility]

## 2018-07-12 ENCOUNTER — Telehealth: Payer: Self-pay | Admitting: Gastroenterology

## 2018-07-12 LAB — CYTOLOGY - PAP
Diagnosis: NEGATIVE
HPV (WINDOPATH): NOT DETECTED

## 2018-07-12 NOTE — Telephone Encounter (Signed)
Patient called & needs to cancel colonoscopy scheduled for 07-23-2018 with Dr  Servando Snare. She is having a hysterectomy & will call back to reschedule.

## 2018-07-12 NOTE — Telephone Encounter (Signed)
Patients colonoscopy has been canceled due to plans for hysterectomy.  Canceled colonoscopy with Theresa Best in [facility].  Referral noted and canceled.  Thanks [employer]

## 2018-07-20 ENCOUNTER — Telehealth: Payer: Self-pay

## 2018-07-20 NOTE — Telephone Encounter (Signed)
LM that she is sick again. Requesting Abx.   Called her and advised we do not give Abx baxk to back and she was given Amox on 07/09/18. I verified she took all of those and she said yes. I explained back to back Abx can cause Cdiff. Advised Mucinex DM and Flonase OTC. She will do this and give time to fight off what could be a common cold.

## 2018-07-23 ENCOUNTER — Ambulatory Visit: Admit: 2018-07-23 | Payer: BC Managed Care – PPO | Admitting: Gastroenterology

## 2018-07-23 SURGERY — COLONOSCOPY WITH PROPOFOL
Anesthesia: Choice

## 2018-08-16 DIAGNOSIS — N814 Uterovaginal prolapse, unspecified: Secondary | ICD-10-CM | POA: Insufficient documentation

## 2018-08-16 DIAGNOSIS — N393 Stress incontinence (female) (male): Secondary | ICD-10-CM | POA: Insufficient documentation

## 2018-08-16 DIAGNOSIS — N8110 Cystocele, unspecified: Secondary | ICD-10-CM | POA: Insufficient documentation

## 2018-09-07 ENCOUNTER — Other Ambulatory Visit: Payer: Self-pay | Admitting: Family Medicine

## 2018-09-07 DIAGNOSIS — I10 Essential (primary) hypertension: Secondary | ICD-10-CM

## 2018-10-02 ENCOUNTER — Other Ambulatory Visit: Payer: Self-pay | Admitting: Family Medicine

## 2018-10-02 DIAGNOSIS — I10 Essential (primary) hypertension: Secondary | ICD-10-CM

## 2019-01-04 ENCOUNTER — Telehealth: Payer: Self-pay

## 2019-01-04 NOTE — Telephone Encounter (Signed)
Pt called with cough, no other symptoms. May take flonase nasal spray and zyrtec- come in if develops production or colored drainage.

## 2019-01-07 ENCOUNTER — Other Ambulatory Visit: Payer: Self-pay

## 2019-01-07 LAB — HM COLONOSCOPY

## 2019-01-08 LAB — HM COLONOSCOPY

## 2019-01-14 ENCOUNTER — Other Ambulatory Visit: Payer: Self-pay

## 2019-01-29 ENCOUNTER — Other Ambulatory Visit: Payer: Self-pay

## 2019-02-20 ENCOUNTER — Other Ambulatory Visit: Payer: Self-pay

## 2019-02-20 DIAGNOSIS — Z20822 Contact with and (suspected) exposure to covid-19: Secondary | ICD-10-CM

## 2019-02-21 ENCOUNTER — Other Ambulatory Visit: Payer: Self-pay

## 2019-02-21 ENCOUNTER — Ambulatory Visit
Admission: RE | Admit: 2019-02-21 | Discharge: 2019-02-21 | Disposition: A | Discharge status: Home / Self Care | Payer: BC Managed Care – PPO | Attending: Family Medicine | Admitting: Family Medicine

## 2019-02-21 ENCOUNTER — Ambulatory Visit (INDEPENDENT_AMBULATORY_CARE_PROVIDER_SITE_OTHER): Payer: BC Managed Care – PPO | Admitting: Family Medicine

## 2019-02-21 ENCOUNTER — Ambulatory Visit
Admission: RE | Admit: 2019-02-21 | Discharge: 2019-02-21 | Disposition: A | Discharge status: Home / Self Care | Payer: BC Managed Care – PPO | Source: Ambulatory Visit | Attending: Family Medicine | Admitting: Family Medicine

## 2019-02-21 ENCOUNTER — Encounter: Payer: Self-pay | Admitting: Family Medicine

## 2019-02-21 VITALS — BP 142/78 | HR 74 | Ht 67.0 in | Wt 279.0 lb

## 2019-02-21 DIAGNOSIS — R053 Chronic cough: Secondary | ICD-10-CM

## 2019-02-21 DIAGNOSIS — I10 Essential (primary) hypertension: Secondary | ICD-10-CM | POA: Diagnosis not present

## 2019-02-21 DIAGNOSIS — K219 Gastro-esophageal reflux disease without esophagitis: Secondary | ICD-10-CM | POA: Diagnosis not present

## 2019-02-21 DIAGNOSIS — J219 Acute bronchiolitis, unspecified: Secondary | ICD-10-CM

## 2019-02-21 DIAGNOSIS — J4521 Mild intermittent asthma with (acute) exacerbation: Secondary | ICD-10-CM

## 2019-02-21 DIAGNOSIS — R05 Cough: Secondary | ICD-10-CM

## 2019-02-21 LAB — NOVEL CORONAVIRUS, NAA: SARS-CoV-2, NAA: NOT DETECTED

## 2019-02-21 MED ORDER — FAMOTIDINE 40 MG PO TABS: 40.0000 mg | ORAL_TABLET | Freq: Every day | ORAL | 2 refills | Status: DC

## 2019-02-21 MED ORDER — HYDROCHLOROTHIAZIDE 12.5 MG PO TABS: 12.5000 mg | ORAL_TABLET | Freq: Every day | ORAL | 1 refills | Status: DC

## 2019-02-21 MED ORDER — AMLODIPINE BESYLATE 5 MG PO TABS: 5.0000 mg | ORAL_TABLET | Freq: Every day | ORAL | 1 refills | Status: DC

## 2019-02-21 MED ORDER — ALBUTEROL SULFATE HFA 108 (90 BASE) MCG/ACT IN AERS: 2.0000 | INHALATION_SPRAY | Freq: Four times a day (QID) | RESPIRATORY_TRACT | 11 refills | Status: DC | PRN

## 2019-02-21 NOTE — Progress Notes (Signed)
Date:  02/21/2019   Name:  Theresa Best   DOB:  [birth year]   MRN:  [id number]   Chief Complaint: Hypertension and Cough (3 weeks off and on. Dry cough. No fever or body aches. )  Hypertension This is a new problem. The current episode started more than 1 month ago. The problem has been waxing and waning since onset. The problem is uncontrolled. Associated symptoms include shortness of breath. Pertinent negatives include no anxiety, blurred vision, chest pain, headaches, malaise/fatigue, neck pain, orthopnea, palpitations, peripheral edema or PND. There are no associated agents to hypertension. Risk factors for coronary artery disease include obesity and smoking/tobacco exposure. Past treatments include calcium channel blockers and diuretics. The current treatment provides moderate improvement. There are no compliance problems.  There is no history of angina, kidney disease, CAD/MI, CVA, heart failure, left ventricular hypertrophy, PVD or retinopathy. There is no history of chronic renal disease, a hypertension causing med or renovascular disease.  Cough This is a chronic problem. The current episode started more than 1 year ago. The problem has been waxing and waning. The problem occurs hourly. The cough is non-productive. Associated symptoms include heartburn, nasal congestion, postnasal drip and shortness of breath. Pertinent negatives include no chest pain, chills, ear pain, eye redness, fever, headaches, myalgias, rash, rhinorrhea, sore throat or wheezing. There is no history of environmental allergies.    Review of Systems  Constitutional: Negative.  Negative for chills, fatigue, fever, malaise/fatigue and unexpected weight change.  HENT: Positive for postnasal drip. Negative for congestion, ear discharge, ear pain, rhinorrhea, sinus pressure, sneezing and sore throat.   Eyes: Negative for blurred vision, photophobia, pain, discharge, redness and itching.  Respiratory: Positive for cough  and shortness of breath. Negative for wheezing and stridor.   Cardiovascular: Negative for chest pain, palpitations, orthopnea and PND.  Gastrointestinal: Positive for heartburn. Negative for abdominal pain, blood in stool, constipation, diarrhea, nausea and vomiting.  Endocrine: Negative for cold intolerance, heat intolerance, polydipsia, polyphagia and polyuria.  Genitourinary: Negative for dysuria, flank pain, frequency, hematuria, menstrual problem, pelvic pain, urgency, vaginal bleeding and vaginal discharge.  Musculoskeletal: Negative for arthralgias, back pain, myalgias and neck pain.  Skin: Negative for rash.  Allergic/Immunologic: Negative for environmental allergies and food allergies.  Neurological: Negative for dizziness, weakness, light-headedness, numbness and headaches.  Hematological: Negative for adenopathy. Does not bruise/bleed easily.  Psychiatric/Behavioral: Negative for dysphoric mood. The patient is not nervous/anxious.     Patient Active Problem List   Diagnosis Date Noted  . Uterine prolapse 08/16/2018  . SUI (stress urinary incontinence, female) 08/16/2018  . POP-Q stage 2 cystocele 08/16/2018  . Essential hypertension 11/03/2016  . Class 3 severe obesity due to excess calories with serious comorbidity and body mass index (BMI) of 45.0 to 49.9 in adult [facility]) 11/03/2016    No Known Allergies  History reviewed. No pertinent surgical history.  Social History   Tobacco Use  . Smoking status: Never Smoker  . Smokeless tobacco: Never Used  Substance Use Topics  . Alcohol use: No    Alcohol/week: 0.0 standard drinks  . Drug use: No     Medication list has been reviewed and updated.  Current Meds  Medication Sig  . acetaminophen (TYLENOL) 325 MG tablet Take by mouth.  Marland Kitchen albuterol (PROVENTIL HFA;VENTOLIN HFA) 108 (90 Base) MCG/ACT inhaler Inhale 2 puffs into the lungs every 6 (six) hours as needed for wheezing or shortness of breath.  Marland Kitchen amLODipine  (NORVASC) 5 MG tablet  Take 1 tablet by mouth once daily  . hydrochlorothiazide (HYDRODIURIL) 12.5 MG tablet Take 1 tablet by mouth once daily  . ibuprofen (ADVIL) 600 MG tablet   . oxyCODONE (OXY IR/ROXICODONE) 5 MG immediate release tablet     PHQ 2/9 Scores 02/21/2019 07/06/2017  PHQ - 2 Score 0 0  PHQ- 9 Score - 0    BP Readings from Last 3 Encounters:  02/21/19 (!) 142/78  07/11/18 (!) 185/93  07/09/18 130/88    Physical Exam Vitals signs and nursing note reviewed.  Constitutional:      Appearance: She is well-developed. She is obese.  HENT:     Head: Normocephalic.     Right Ear: Tympanic membrane, ear canal and external ear normal.     Left Ear: Tympanic membrane, ear canal and external ear normal.  Eyes:     General: Lids are everted, no foreign bodies appreciated. No scleral icterus.       Left eye: No foreign body or hordeolum.     Conjunctiva/sclera: Conjunctivae normal.     Right eye: Right conjunctiva is not injected.     Left eye: Left conjunctiva is not injected.     Pupils: Pupils are equal, round, and reactive to light.  Neck:     Musculoskeletal: Normal range of motion and neck supple.     Thyroid: No thyromegaly.     Vascular: No JVD.     Trachea: No tracheal deviation.  Cardiovascular:     Rate and Rhythm: Normal rate and regular rhythm.     Chest Wall: PMI is not displaced.     Pulses: Normal pulses.          Carotid pulses are 2+ on the right side and 2+ on the left side.      Radial pulses are 2+ on the right side and 2+ on the left side.       Femoral pulses are 2+ on the right side and 2+ on the left side.      Popliteal pulses are 2+ on the right side and 2+ on the left side.       Dorsalis pedis pulses are 2+ on the right side and 2+ on the left side.       Posterior tibial pulses are 2+ on the right side and 2+ on the left side.     Heart sounds: Normal heart sounds, S1 normal and S2 normal. No murmur. No systolic murmur. No diastolic murmur. No  friction rub. No gallop. No S3 or S4 sounds.   Pulmonary:     Effort: Pulmonary effort is normal. No respiratory distress.     Breath sounds: Normal breath sounds. No wheezing or rales.  Abdominal:     General: Bowel sounds are normal.     Palpations: Abdomen is soft. There is no mass.     Tenderness: There is no abdominal tenderness. There is no guarding or rebound.  Musculoskeletal: Normal range of motion.        General: No tenderness.     Right lower leg: No edema.     Left lower leg: No edema.  Lymphadenopathy:     Cervical: No cervical adenopathy.  Skin:    General: Skin is warm.     Findings: No rash.  Neurological:     Mental Status: She is alert and oriented to person, place, and time.     Cranial Nerves: No cranial nerve deficit.     Deep Tendon Reflexes: Reflexes  normal.  Psychiatric:        Mood and Affect: Mood is not anxious or depressed.     Wt Readings from Last 3 Encounters:  02/21/19 279 lb (126.6 kg)  07/11/18 278 lb (126.1 kg)  07/09/18 270 lb (122.5 kg)    BP (!) 142/78   Pulse 74   Ht 5\' 7"  (1.702 m)   Wt 279 lb (126.6 kg)   SpO2 96%   BMI 43.70 kg/m     Assessment and Plan: 1. Essential hypertension Chronic.  Controlled.  Continue Norvasc 5 mg once a day and hydrochlorothiazide 12.5 mg once a day.  Will recheck in 6 months after weight loss encouraged. - amLODipine (NORVASC) 5 MG tablet; Take 1 tablet (5 mg total) by mouth daily.  Dispense: 90 tablet; Refill: 1 - hydrochlorothiazide (HYDRODIURIL) 12.5 MG tablet; Take 1 tablet (12.5 mg total) by mouth daily.  Dispense: 90 tablet; Refill: 1  2. Bronchiolitis This is chronic in nature and we will initiate albuterol 1 to 2 puffs every 6 hours in addition we will add Symbicort 1 puff twice a day. - albuterol (VENTOLIN HFA) 108 (90 Base) MCG/ACT inhaler; Inhale 2 puffs into the lungs every 6 (six) hours as needed for wheezing or shortness of breath.  Dispense: 6.7 g; Refill: 11  3. Mild intermittent  reactive airway disease with acute exacerbation History of mild intermittent airway disease will continue albuterol on a regular basis to see if this will help.  We will also add Symbicort to this regimen. - albuterol (VENTOLIN HFA) 108 (90 Base) MCG/ACT inhaler; Inhale 2 puffs into the lungs every 6 (six) hours as needed for wheezing or shortness of breath.  Dispense: 6.7 g; Refill: 11  4. Gastroesophageal reflux disease, esophagitis presence not specified Patient has been evaluated with what sounds like an upper GI high barium swallow however I cannot find the results we are going to initiate famotidine 40 mg once a day. - famotidine (PEPCID) 40 MG tablet; Take 1 tablet (40 mg total) by mouth daily.  Dispense: 60 tablet; Refill: 2  5. Chronic cough Patient has had a chronic cough this may be due to multiple reasons including reactive airway disease reflux.  Will check chest x-ray that it has been over 3 years since the last. - DG Chest 2 View; Future

## 2019-02-21 NOTE — Patient Instructions (Signed)

## 2019-05-14 ENCOUNTER — Ambulatory Visit (INDEPENDENT_AMBULATORY_CARE_PROVIDER_SITE_OTHER): Payer: BC Managed Care – PPO | Admitting: Family Medicine

## 2019-05-14 ENCOUNTER — Encounter: Payer: Self-pay | Admitting: Family Medicine

## 2019-05-14 VITALS — Temp 97.9°F | Ht 67.0 in | Wt 270.0 lb

## 2019-05-14 DIAGNOSIS — J0100 Acute maxillary sinusitis, unspecified: Secondary | ICD-10-CM

## 2019-05-14 DIAGNOSIS — J219 Acute bronchiolitis, unspecified: Secondary | ICD-10-CM

## 2019-05-14 MED ORDER — AMOXICILLIN-POT CLAVULANATE 875-125 MG PO TABS: 1.0000 | ORAL_TABLET | Freq: Two times a day (BID) | ORAL | 0 refills | Status: DC

## 2019-05-14 MED ORDER — GUAIFENESIN-CODEINE 100-10 MG/5ML PO SYRP: 5.0000 mL | ORAL_SOLUTION | Freq: Four times a day (QID) | ORAL | 0 refills | Status: DC | PRN

## 2019-05-14 NOTE — Progress Notes (Signed)
Date:  05/14/2019   Name:  Theresa Best   DOB:  1960/10/25   MRN:  [id number]   Chief Complaint: Sinusitis (green nasal production, cough- albuterol helping, sudafed- no help.. 3 days of symptoms)  I connected withthis patient, Theresa Best, by telephoneat the patient's work.  I verified that I am speaking with the correct person using two identifiers. This visit was conducted via telephone due to the Covid-19 outbreak from my office at [facility] in [place], [place]. I discussed the limitations, risks, security and privacy concerns of performing an evaluation and management service by telephone. I also discussed with the patient that there may be a patient responsible charge related to this service. The patient expressed understanding and agreed to proceed.  Sinusitis This is a new problem. The current episode started in the past 7 days (saturday). The problem has been gradually worsening since onset. There has been no fever. The pain is moderate. Associated symptoms include congestion, coughing, a hoarse voice, neck pain, sinus pressure and sneezing. Pertinent negatives include no chills, diaphoresis, ear pain, headaches, shortness of breath, sore throat or swollen glands. Past treatments include oral decongestants. The treatment provided mild relief.    Lab Results  Component Value Date   CREATININE 0.55 (L) 07/09/2018   BUN 16 07/09/2018   NA 141 07/09/2018   K 4.3 07/09/2018   CL 103 07/09/2018   CO2 22 07/09/2018   Lab Results  Component Value Date   CHOL 254 (H) 07/09/2018   HDL 48 07/09/2018   LDLCALC 183 (H) 07/09/2018   TRIG 115 07/09/2018   CHOLHDL 5.3 (H) 07/09/2018   No results found for: TSH No results found for: HGBA1C   Review of Systems  Constitutional: Negative.  Negative for chills, diaphoresis, fatigue, fever and unexpected weight change.  HENT: Positive for congestion, hoarse voice, sinus pressure and sneezing. Negative for ear discharge, ear  pain, rhinorrhea and sore throat.   Eyes: Negative for photophobia, pain, discharge, redness and itching.  Respiratory: Positive for cough. Negative for shortness of breath, wheezing and stridor.   Gastrointestinal: Negative for abdominal pain, blood in stool, constipation, diarrhea, nausea and vomiting.  Endocrine: Negative for cold intolerance, heat intolerance, polydipsia, polyphagia and polyuria.  Genitourinary: Negative for dysuria, flank pain, frequency, hematuria, menstrual problem, pelvic pain, urgency, vaginal bleeding and vaginal discharge.  Musculoskeletal: Positive for neck pain. Negative for arthralgias, back pain and myalgias.  Skin: Negative for rash.  Allergic/Immunologic: Negative for environmental allergies and food allergies.  Neurological: Negative for dizziness, weakness, light-headedness, numbness and headaches.  Hematological: Negative for adenopathy. Does not bruise/bleed easily.  Psychiatric/Behavioral: Negative for dysphoric mood. The patient is not nervous/anxious.     Patient Active Problem List   Diagnosis Date Noted  . Uterine prolapse 08/16/2018  . SUI (stress urinary incontinence, female) 08/16/2018  . POP-Q stage 2 cystocele 08/16/2018  . Essential hypertension 11/03/2016  . Class 3 severe obesity due to excess calories with serious comorbidity and body mass index (BMI) of 45.0 to 49.9 in adult [facility]) 11/03/2016    No Known Allergies  No past surgical history on file.  Social History   Tobacco Use  . Smoking status: Never Smoker  . Smokeless tobacco: Never Used  Substance Use Topics  . Alcohol use: No    Alcohol/week: 0.0 standard drinks  . Drug use: No     Medication list has been reviewed and updated.  Current Meds  Medication Sig  . acetaminophen (TYLENOL)  325 MG tablet Take by mouth.  Marland Kitchen albuterol (VENTOLIN HFA) 108 (90 Base) MCG/ACT inhaler Inhale 2 puffs into the lungs every 6 (six) hours as needed for wheezing or shortness of breath.   Marland Kitchen amLODipine (NORVASC) 5 MG tablet Take 1 tablet (5 mg total) by mouth daily.  . famotidine (PEPCID) 40 MG tablet Take 1 tablet (40 mg total) by mouth daily.  . hydrochlorothiazide (HYDRODIURIL) 12.5 MG tablet Take 1 tablet (12.5 mg total) by mouth daily.  . [DISCONTINUED] ibuprofen (ADVIL) 600 MG tablet     PHQ 2/9 Scores 05/14/2019 02/21/2019 07/06/2017  PHQ - 2 Score 0 0 0  PHQ- 9 Score 0 - 0    BP Readings from Last 3 Encounters:  02/21/19 (!) 142/78  07/11/18 (!) 185/93  07/09/18 130/88    Physical Exam Nursing note reviewed.     Wt Readings from Last 3 Encounters:  05/14/19 270 lb (122.5 kg)  02/21/19 279 lb (126.6 kg)  07/11/18 278 lb (126.1 kg)    Temp 97.9 F (36.6 C) (Oral)   Ht 5\' 7"  (1.702 m)   Wt 270 lb (122.5 kg)   BMI 42.29 kg/m   Assessment and Plan: 1. Acute maxillary sinusitis, recurrence not specified Acute.  Persistent.  Patient bilateral likely Covid more likely acute maxillary sinusitis with productive nasal drainage.  Will initiate Augmentin 875 mg twice a day. - amoxicillin-clavulanate (AUGMENTIN) 875-125 MG tablet; Take 1 tablet by mouth 2 (two) times daily.  Dispense: 20 tablet; Refill: 0  2. Bronchiolitis Patient with a history of intermittent asthma and cough consistent with above.  This is likely a bronchiolitis.  Will call in Robitussin-AC for control of cough and mobilization of mucus. - guaiFENesin-codeine (ROBITUSSIN AC) 100-10 MG/5ML syrup; Take 5 mLs by mouth 4 (four) times daily as needed for cough.  Dispense: 118 mL; Refill: 0  I spent 10 minutes with this patient, More than 50% of that time was spent in face to face education, counseling and care coordination.

## 2019-06-19 ENCOUNTER — Encounter: Payer: Self-pay | Admitting: Family Medicine

## 2019-06-19 ENCOUNTER — Ambulatory Visit: Payer: BC Managed Care – PPO | Admitting: Family Medicine

## 2019-06-19 ENCOUNTER — Other Ambulatory Visit: Payer: Self-pay

## 2019-06-19 VITALS — BP 138/80 | HR 84 | Temp 99.1°F | Ht 67.0 in | Wt 282.0 lb

## 2019-06-19 DIAGNOSIS — J0100 Acute maxillary sinusitis, unspecified: Secondary | ICD-10-CM | POA: Diagnosis not present

## 2019-06-19 DIAGNOSIS — J219 Acute bronchiolitis, unspecified: Secondary | ICD-10-CM

## 2019-06-19 MED ORDER — GUAIFENESIN-CODEINE 100-10 MG/5ML PO SYRP: 5.0000 mL | ORAL_SOLUTION | Freq: Four times a day (QID) | ORAL | 0 refills | Status: DC | PRN

## 2019-06-19 MED ORDER — AMOXICILLIN-POT CLAVULANATE 875-125 MG PO TABS: 1.0000 | ORAL_TABLET | Freq: Two times a day (BID) | ORAL | 0 refills | Status: DC

## 2019-06-19 NOTE — Progress Notes (Signed)
Date:  06/19/2019   Name:  Theresa Best   DOB:  10-12-1960   MRN:  [id number]   Chief Complaint: Sinusitis (cough and cong- green production, headache/ frontal )  Sinusitis This is a new problem. The current episode started today (last night). The problem has been gradually worsening since onset. There has been no fever. Associated symptoms include congestion, coughing, a hoarse voice, sinus pressure and sneezing. Pertinent negatives include no chills, diaphoresis, ear pain, headaches, neck pain, shortness of breath, sore throat or swollen glands. Past treatments include acetaminophen and oral decongestants.    Lab Results  Component Value Date   CREATININE 0.55 (L) 07/09/2018   BUN 16 07/09/2018   NA 141 07/09/2018   K 4.3 07/09/2018   CL 103 07/09/2018   CO2 22 07/09/2018   Lab Results  Component Value Date   CHOL 254 (H) 07/09/2018   HDL 48 07/09/2018   LDLCALC 183 (H) 07/09/2018   TRIG 115 07/09/2018   CHOLHDL 5.3 (H) 07/09/2018   No results found for: TSH No results found for: HGBA1C   Review of Systems  Constitutional: Negative for chills, diaphoresis and fever.  HENT: Positive for congestion, hoarse voice, sinus pressure and sneezing. Negative for drooling, ear discharge, ear pain and sore throat.   Respiratory: Positive for cough. Negative for shortness of breath and wheezing.   Cardiovascular: Negative for chest pain, palpitations and leg swelling.  Gastrointestinal: Negative for abdominal pain, blood in stool, constipation, diarrhea and nausea.  Endocrine: Negative for polydipsia.  Genitourinary: Negative for dysuria, frequency, hematuria and urgency.  Musculoskeletal: Negative for back pain, myalgias and neck pain.  Skin: Negative for rash.  Allergic/Immunologic: Negative for environmental allergies.  Neurological: Negative for dizziness and headaches.  Hematological: Does not bruise/bleed easily.  Psychiatric/Behavioral: Negative for suicidal ideas. The  patient is not nervous/anxious.     Patient Active Problem List   Diagnosis Date Noted  . Uterine prolapse 08/16/2018  . SUI (stress urinary incontinence, female) 08/16/2018  . POP-Q stage 2 cystocele 08/16/2018  . Essential hypertension 11/03/2016  . Class 3 severe obesity due to excess calories with serious comorbidity and body mass index (BMI) of 45.0 to 49.9 in adult [facility]) 11/03/2016    No Known Allergies  No past surgical history on file.  Social History   Tobacco Use  . Smoking status: Never Smoker  . Smokeless tobacco: Never Used  Substance Use Topics  . Alcohol use: No    Alcohol/week: 0.0 standard drinks  . Drug use: No     Medication list has been reviewed and updated.  Current Meds  Medication Sig  . acetaminophen (TYLENOL) 325 MG tablet Take by mouth.  Marland Kitchen albuterol (VENTOLIN HFA) 108 (90 Base) MCG/ACT inhaler Inhale 2 puffs into the lungs every 6 (six) hours as needed for wheezing or shortness of breath.  Marland Kitchen amLODipine (NORVASC) 5 MG tablet Take 1 tablet (5 mg total) by mouth daily.  . famotidine (PEPCID) 40 MG tablet Take 1 tablet (40 mg total) by mouth daily.  . hydrochlorothiazide (HYDRODIURIL) 12.5 MG tablet Take 1 tablet (12.5 mg total) by mouth daily.  . [DISCONTINUED] amoxicillin-clavulanate (AUGMENTIN) 875-125 MG tablet Take 1 tablet by mouth 2 (two) times daily.  . [DISCONTINUED] guaiFENesin-codeine (ROBITUSSIN AC) 100-10 MG/5ML syrup Take 5 mLs by mouth 4 (four) times daily as needed for cough.    PHQ 2/9 Scores 06/19/2019 05/14/2019 02/21/2019 07/06/2017  PHQ - 2 Score 0 0 0 0  PHQ- 9  Score 0 0 - 0    BP Readings from Last 3 Encounters:  06/19/19 138/80  02/21/19 (!) 142/78  07/11/18 (!) 185/93    Physical Exam Vitals and nursing note reviewed.  Constitutional:      General: She is not in acute distress.    Appearance: She is not diaphoretic.  HENT:     Head: Normocephalic and atraumatic.     Jaw: There is normal jaw occlusion.     Right  Ear: Tympanic membrane, ear canal and external ear normal. No decreased hearing noted. There is no impacted cerumen.     Left Ear: Tympanic membrane, ear canal and external ear normal. No decreased hearing noted. There is no impacted cerumen.     Nose: No congestion or rhinorrhea.     Right Sinus: Maxillary sinus tenderness present. No frontal sinus tenderness.     Left Sinus: No maxillary sinus tenderness or frontal sinus tenderness.     Mouth/Throat:     Lips: Pink.     Mouth: Mucous membranes are moist.     Tongue: No lesions.     Pharynx: Oropharynx is clear. Uvula midline.  Eyes:     General:        Right eye: No discharge.        Left eye: No discharge.     Conjunctiva/sclera: Conjunctivae normal.     Pupils: Pupils are equal, round, and reactive to light.  Neck:     Thyroid: No thyromegaly.     Vascular: No JVD.  Cardiovascular:     Rate and Rhythm: Normal rate and regular rhythm.     Heart sounds: Normal heart sounds. No murmur. No friction rub. No gallop.   Pulmonary:     Effort: Pulmonary effort is normal.     Breath sounds: Normal breath sounds. No decreased breath sounds, wheezing, rhonchi or rales.  Abdominal:     General: Bowel sounds are normal.     Palpations: Abdomen is soft. There is no mass.     Tenderness: There is no abdominal tenderness. There is no guarding.  Musculoskeletal:        General: Normal range of motion.     Cervical back: Normal range of motion and neck supple.  Lymphadenopathy:     Cervical: No cervical adenopathy.  Skin:    General: Skin is warm and dry.  Neurological:     Mental Status: She is alert.     Deep Tendon Reflexes: Reflexes are normal and symmetric.     Wt Readings from Last 3 Encounters:  06/19/19 282 lb (127.9 kg)  05/14/19 270 lb (122.5 kg)  02/21/19 279 lb (126.6 kg)    BP 138/80   Pulse 84   Temp 99.1 F (37.3 C) (Oral)   Ht 5\' 7"  (1.702 m)   Wt 282 lb (127.9 kg)   SpO2 98%   BMI 44.17 kg/m   Assessment  and Plan:  1. Acute maxillary sinusitis, recurrence not specified Acute.  Persistent.  Stable.  Will initiate Augmentin 875 mg twice a day. - amoxicillin-clavulanate (AUGMENTIN) 875-125 MG tablet; Take 1 tablet by mouth 2 (two) times daily.  Dispense: 20 tablet; Refill: 0  2. Bronchiolitis Patient has a cough at night which is bothersome.  We will treat with Robitussin-AC 1 teaspoon every 6 hours as needed for cough. - guaiFENesin-codeine (ROBITUSSIN AC) 100-10 MG/5ML syrup; Take 5 mLs by mouth 4 (four) times daily as needed for cough.  Dispense: 118 mL; Refill: 0

## 2019-06-27 ENCOUNTER — Other Ambulatory Visit: Payer: Self-pay

## 2019-06-27 ENCOUNTER — Telehealth: Payer: Self-pay

## 2019-06-27 DIAGNOSIS — J0100 Acute maxillary sinusitis, unspecified: Secondary | ICD-10-CM

## 2019-06-27 MED ORDER — AZITHROMYCIN 250 MG PO TABS: ORAL_TABLET | ORAL | 0 refills | Status: DC

## 2019-06-27 NOTE — Telephone Encounter (Signed)
Pt called in stating she was not getting any better as far as cough and congestion. She is on day 7 of Augmentin, no fever. She has been switched over to [facility], but has been told to go to ER if develops SOB or is not significantly better by Saturday. Pt voiced understanding, she did mention she tested positive for covid on Jan 6th

## 2019-06-27 NOTE — Progress Notes (Unsigned)
Sent in [facility] to [facility]

## 2019-07-11 ENCOUNTER — Encounter: Payer: BC Managed Care – PPO | Admitting: Family Medicine

## 2019-07-18 ENCOUNTER — Other Ambulatory Visit: Payer: Self-pay | Admitting: Family Medicine

## 2019-07-18 DIAGNOSIS — Z1231 Encounter for screening mammogram for malignant neoplasm of breast: Secondary | ICD-10-CM

## 2019-07-22 ENCOUNTER — Ambulatory Visit (INDEPENDENT_AMBULATORY_CARE_PROVIDER_SITE_OTHER): Payer: BC Managed Care – PPO | Admitting: Family Medicine

## 2019-07-22 ENCOUNTER — Encounter: Payer: Self-pay | Admitting: Family Medicine

## 2019-07-22 ENCOUNTER — Telehealth: Payer: Self-pay

## 2019-07-22 ENCOUNTER — Other Ambulatory Visit: Payer: Self-pay

## 2019-07-22 VITALS — BP 124/78 | HR 76 | Ht 67.0 in | Wt 281.0 lb

## 2019-07-22 DIAGNOSIS — I10 Essential (primary) hypertension: Secondary | ICD-10-CM

## 2019-07-22 DIAGNOSIS — I83811 Varicose veins of right lower extremities with pain: Secondary | ICD-10-CM | POA: Diagnosis not present

## 2019-07-22 DIAGNOSIS — Z0000 Encounter for general adult medical examination without abnormal findings: Secondary | ICD-10-CM | POA: Diagnosis not present

## 2019-07-22 DIAGNOSIS — Z6841 Body Mass Index (BMI) 40.0 and over, adult: Secondary | ICD-10-CM

## 2019-07-22 DIAGNOSIS — E7801 Familial hypercholesterolemia: Secondary | ICD-10-CM

## 2019-07-22 MED ORDER — HYDROCHLOROTHIAZIDE 12.5 MG PO TABS: 12.5000 mg | ORAL_TABLET | Freq: Every day | ORAL | 1 refills | Status: DC

## 2019-07-22 NOTE — Patient Instructions (Signed)

## 2019-07-22 NOTE — Telephone Encounter (Signed)
Patient walked in after having her labs drawn today from her CPE stating that she wanted to discuss a cough she has had since December.   Told patient I will let you know and that you will call her this afternoon to discuss. She did not mention this to the front desk or that she had a cough during her CPE today.  Please advise.

## 2019-07-22 NOTE — Telephone Encounter (Signed)
Pt has an appt with ENT this week. Will see them to discuss.

## 2019-07-22 NOTE — Progress Notes (Signed)
Date:  07/22/2019   Name:  Theresa Best   DOB:  [birth year]   MRN:  [id number]   Chief Complaint: Annual Exam (has appt for mammo Wed.) and calf burning  Patient is a 59 year old female who presents for a comprehensive physical exam. The patient reports the following problems: burning . Health maintenance has been reviewed up to date.   Lab Results  Component Value Date   CREATININE 0.55 (L) 07/09/2018   BUN 16 07/09/2018   NA 141 07/09/2018   K 4.3 07/09/2018   CL 103 07/09/2018   CO2 22 07/09/2018   Lab Results  Component Value Date   CHOL 254 (H) 07/09/2018   HDL 48 07/09/2018   LDLCALC 183 (H) 07/09/2018   TRIG 115 07/09/2018   CHOLHDL 5.3 (H) 07/09/2018   No results found for: TSH No results found for: HGBA1C   Review of Systems  Constitutional: Negative.  Negative for activity change, appetite change, chills, diaphoresis, fatigue, fever and unexpected weight change.  HENT: Negative for congestion, ear discharge, ear pain, mouth sores, nosebleeds, postnasal drip, rhinorrhea, sinus pressure, sinus pain, sneezing and sore throat.   Eyes: Negative for photophobia, pain, discharge, redness and itching.  Respiratory: Negative for apnea, cough, choking, chest tightness, shortness of breath, wheezing and stridor.   Cardiovascular: Negative for chest pain, palpitations and leg swelling.  Gastrointestinal: Negative for abdominal pain, blood in stool, constipation, diarrhea, nausea and vomiting.  Endocrine: Negative for cold intolerance, heat intolerance, polydipsia, polyphagia and polyuria.  Genitourinary: Negative for dysuria, flank pain, frequency, hematuria, menstrual problem, pelvic pain, urgency, vaginal bleeding and vaginal discharge.  Musculoskeletal: Negative for arthralgias, back pain and myalgias.  Skin: Negative for rash.  Allergic/Immunologic: Negative for environmental allergies and food allergies.  Neurological: Negative for dizziness, weakness,  light-headedness, numbness and headaches.  Hematological: Negative for adenopathy. Does not bruise/bleed easily.  Psychiatric/Behavioral: Negative for dysphoric mood. The patient is not nervous/anxious.     Patient Active Problem List   Diagnosis Date Noted  . Uterine prolapse 08/16/2018  . SUI (stress urinary incontinence, female) 08/16/2018  . POP-Q stage 2 cystocele 08/16/2018  . Essential hypertension 11/03/2016  . Class 3 severe obesity due to excess calories with serious comorbidity and body mass index (BMI) of 45.0 to 49.9 in adult [facility]) 11/03/2016    No Known Allergies  No past surgical history on file.  Social History   Tobacco Use  . Smoking status: Never Smoker  . Smokeless tobacco: Never Used  Substance Use Topics  . Alcohol use: No    Alcohol/week: 0.0 standard drinks  . Drug use: No     Medication list has been reviewed and updated.  Current Meds  Medication Sig  . acetaminophen (TYLENOL) 325 MG tablet Take by mouth.  Marland Kitchen albuterol (VENTOLIN HFA) 108 (90 Base) MCG/ACT inhaler Inhale 2 puffs into the lungs every 6 (six) hours as needed for wheezing or shortness of breath.  Marland Kitchen amLODipine (NORVASC) 5 MG tablet Take 1 tablet (5 mg total) by mouth daily.  . famotidine (PEPCID) 40 MG tablet Take 1 tablet (40 mg total) by mouth daily.  . hydrochlorothiazide (HYDRODIURIL) 12.5 MG tablet Take 1 tablet (12.5 mg total) by mouth daily.    PHQ 2/9 Scores 07/22/2019 06/19/2019 05/14/2019 02/21/2019  PHQ - 2 Score 0 0 0 0  PHQ- 9 Score 0 0 0 -    BP Readings from Last 3 Encounters:  07/22/19 124/78  06/19/19 138/80  02/21/19 Marland Kitchen)  142/78    Physical Exam Vitals and nursing note reviewed.  Constitutional:      General: She is not in acute distress.    Appearance: She is obese. She is not diaphoretic.  HENT:     Head: Normocephalic and atraumatic.     Jaw: There is normal jaw occlusion.     Right Ear: Tympanic membrane, ear canal and external ear normal.     Left Ear:  Tympanic membrane, ear canal and external ear normal.     Nose: Nose normal. No congestion or rhinorrhea.     Right Turbinates: Not enlarged.     Left Turbinates: Not enlarged.     Mouth/Throat:     Lips: Pink.     Mouth: Mucous membranes are moist.     Dentition: Normal dentition.     Tongue: No lesions.     Pharynx: Oropharynx is clear.  Eyes:     General: Lids are normal. Vision grossly intact. Gaze aligned appropriately.        Right eye: No discharge.        Left eye: No discharge.     Extraocular Movements: Extraocular movements intact.     Conjunctiva/sclera: Conjunctivae normal.     Pupils: Pupils are equal, round, and reactive to light.     Funduscopic exam:    Right eye: Red reflex present.        Left eye: Red reflex present. Neck:     Thyroid: No thyroid mass, thyromegaly or thyroid tenderness.     Vascular: Normal carotid pulses. No carotid bruit, hepatojugular reflux or JVD.     Trachea: Trachea and phonation normal.  Cardiovascular:     Rate and Rhythm: Normal rate and regular rhythm.     Chest Wall: PMI is not displaced.     Pulses: Normal pulses.          Carotid pulses are 2+ on the right side and 2+ on the left side.      Radial pulses are 2+ on the right side and 2+ on the left side.       Femoral pulses are 2+ on the right side and 2+ on the left side.      Popliteal pulses are 2+ on the right side and 2+ on the left side.       Dorsalis pedis pulses are 2+ on the right side and 2+ on the left side.       Posterior tibial pulses are 2+ on the right side and 2+ on the left side.     Heart sounds: Normal heart sounds, S1 normal and S2 normal. No murmur. No systolic murmur. No diastolic murmur. No friction rub. No gallop. No S3 or S4 sounds.   Pulmonary:     Effort: Pulmonary effort is normal.     Breath sounds: Normal breath sounds. No decreased air movement. No decreased breath sounds, wheezing, rhonchi or rales.  Chest:     Chest wall: No mass or  tenderness.     Breasts: Breasts are symmetrical.        Right: Normal. No swelling, bleeding, inverted nipple, mass, nipple discharge, skin change or tenderness.        Left: Normal. No swelling, bleeding, inverted nipple, mass, nipple discharge, skin change or tenderness.  Abdominal:     General: Bowel sounds are normal.     Palpations: Abdomen is soft. There is no hepatomegaly, splenomegaly or mass.     Tenderness: There is no  abdominal tenderness. There is no right CVA tenderness, left CVA tenderness or guarding.  Genitourinary:    Rectum: Normal. Guaiac result negative.  Musculoskeletal:        General: Normal range of motion.     Cervical back: Full passive range of motion without pain, normal range of motion and neck supple. No rigidity.     Right lower leg: Tenderness present. No edema.     Left lower leg: No edema.     Comments: Palpable superficial vein  Lymphadenopathy:     Head:     Right side of head: No submental or submandibular adenopathy.     Left side of head: No submental or submandibular adenopathy.     Cervical: No cervical adenopathy.     Right cervical: No superficial, deep or posterior cervical adenopathy.    Left cervical: No superficial, deep or posterior cervical adenopathy.     Upper Body:     Right upper body: No supraclavicular or axillary adenopathy.     Left upper body: No supraclavicular or axillary adenopathy.  Skin:    General: Skin is warm and dry.     Capillary Refill: Capillary refill takes less than 2 seconds.  Neurological:     Mental Status: She is alert and oriented to person, place, and time.     Cranial Nerves: Cranial nerves are intact.     Sensory: Sensation is intact.     Motor: Motor function is intact.     Deep Tendon Reflexes: Reflexes are normal and symmetric.     Wt Readings from Last 3 Encounters:  07/22/19 281 lb (127.5 kg)  06/19/19 282 lb (127.9 kg)  05/14/19 270 lb (122.5 kg)    BP 124/78   Pulse 76   Ht 5\' 7"   (1.702 m)   Wt 281 lb (127.5 kg)   BMI 44.01 kg/m   Assessment and Plan: 1. Annual physical exam No subjective/objective concerns noted during history and physical exam.  Patient's previous encounters, most recent labs, most recent imaging, and any recent care elsewhere were reviewed.VINA BYRD is a 59 y.o. female who presents today for her Complete Annual Exam. She feels well. She reports exercising intermittently. She reports she is sleeping well. ANDREINA OUTTEN is a 59 y.o. female who presents today for her Complete Annual Exam. Immunizations are reviewed and recommendations provided.   Age appropriate screening tests are discussed. Counseling given for risk factor reduction interventions.  2. Varicose veins of right lower extremity with pain Patient had a discomfort on the posterior aspect of her right calf there is a palpable vein that is probably thrombosed in the past with some inflammation discomfort.  Have suggested getting some over-the-counter Voltaren to use on this area once or twice a day whenever discomfort occurs.  3. Class 3 severe obesity due to excess calories with serious comorbidity and body mass index (BMI) of 40.0 to 44.9 in adult [facility]) Health risks of being over weight were discussed and patient was counseled on weight loss options and exercise.  Dietary sheets were given for weight loss and cholesterol control coverage.  4. Essential hypertension Chronic.  Controlled.  Stable.  Continue hydrochlorothiazide 12.5 mg once a day.  Will check renal function panel. - Renal Function Panel - hydrochlorothiazide (HYDRODIURIL) 12.5 MG tablet; Take 1 tablet (12.5 mg total) by mouth daily.  Dispense: 90 tablet; Refill: 1  5. Familial hypercholesterolemia Chronic.  Controlled.  Stable.  Patient has mild elevation of LDL.  Will  check lipid panel and adjust treatment either atorvastatin or diet accordingly. - Lipid Panel With LDL/HDL Ratio

## 2019-07-22 NOTE — Telephone Encounter (Signed)
Pt is supposed to be seeing ENT for this

## 2019-07-23 LAB — LIPID PANEL WITH LDL/HDL RATIO
Cholesterol, Total: 279 mg/dL — ABNORMAL HIGH (ref 100–199)
HDL: 50 mg/dL (ref >39)
LDL Chol Calc (NIH): 205 mg/dL — ABNORMAL HIGH (ref 0–99)
LDL/HDL Ratio: 4.1 ratio — ABNORMAL HIGH (ref 0.0–3.2)
Triglycerides: 132 mg/dL (ref 0–149)
VLDL Cholesterol Cal: 24 mg/dL (ref 5–40)

## 2019-07-23 LAB — RENAL FUNCTION PANEL
Albumin: 4.4 g/dL (ref 3.8–4.9)
BUN/Creatinine Ratio: 22 (ref 9–23)
BUN: 13 mg/dL (ref 6–24)
CO2: 23 mmol/L (ref 20–29)
Calcium: 9.7 mg/dL (ref 8.7–10.2)
Chloride: 100 mmol/L (ref 96–106)
Creatinine, Ser: 0.6 mg/dL (ref 0.57–1.00)
GFR calc Af Amer: 117 mL/min/{1.73_m2} (ref >59)
GFR calc non Af Amer: 102 mL/min/{1.73_m2} (ref >59)
Glucose: 89 mg/dL (ref 65–99)
Phosphorus: 3.6 mg/dL (ref 3.0–4.3)
Potassium: 4.6 mmol/L (ref 3.5–5.2)
Sodium: 139 mmol/L (ref 134–144)

## 2019-07-24 ENCOUNTER — Inpatient Hospital Stay: Admission: RE | Admit: 2019-07-24 | Payer: BC Managed Care – PPO | Source: Ambulatory Visit

## 2019-10-29 ENCOUNTER — Other Ambulatory Visit: Payer: Self-pay | Admitting: Family Medicine

## 2019-10-29 DIAGNOSIS — I10 Essential (primary) hypertension: Secondary | ICD-10-CM

## 2019-10-29 NOTE — Telephone Encounter (Signed)
Requested Prescriptions  Pending Prescriptions Disp Refills  . amLODipine (NORVASC) 5 MG tablet [Pharmacy Med Name: amLODIPine Besylate 5 MG Oral Tablet] 90 tablet 0    Sig: Take 1 tablet by mouth once daily     Cardiovascular:  Calcium Channel Blockers Passed - 10/29/2019  6:20 PM      Passed - Last BP in normal range    BP Readings from Last 1 Encounters:  07/22/19 124/78         Passed - Valid encounter within last 6 months    Recent Outpatient Visits          3 months ago Annual physical exam   [facility] [name], MD   4 months ago Acute maxillary sinusitis, recurrence not specified   [facility] [name], MD   5 months ago Acute maxillary sinusitis, recurrence not specified   [facility] [name], MD   8 months ago Essential hypertension   [facility] [name], MD   1 year ago Screening for cervical cancer   [facility] [name], MD      Future Appointments            In 2 months [name], MD [facility]

## 2020-01-08 ENCOUNTER — Telehealth: Payer: Self-pay | Admitting: Family Medicine

## 2020-01-08 NOTE — Telephone Encounter (Signed)
Needs to see please sched for in the morning

## 2020-01-08 NOTE — Telephone Encounter (Unsigned)
Copied from CRM [id number]. Topic: General - Inquiry >> Jan 08, 2020  9:04 AM Theresa Best wrote: Reason for CRM: Patient is requesting a medication. Patient has a serve case of chiggers, Patient states they are all her legs to her buttocks.  Call back [phone]

## 2020-01-09 ENCOUNTER — Ambulatory Visit: Payer: BC Managed Care – PPO | Admitting: Family Medicine

## 2020-01-20 ENCOUNTER — Ambulatory Visit: Payer: BC Managed Care – PPO | Admitting: Family Medicine

## 2020-05-07 ENCOUNTER — Other Ambulatory Visit: Payer: Self-pay | Admitting: Family Medicine

## 2020-05-07 DIAGNOSIS — I10 Essential (primary) hypertension: Secondary | ICD-10-CM

## 2020-05-07 NOTE — Telephone Encounter (Signed)
Medication Refill - Medication: Amlodipine and HCTZ  Has the patient contacted their pharmacy? Yes.   (Agent: If no, request that the patient contact the pharmacy for the refill.) (Agent: If yes, when and what did the pharmacy advise?)  Preferred Pharmacy (with phone number or street name): [id number] - [place], [place] - [address] [place] [address]  Agent: Please be advised that RX refills may take up to 3 business days. We ask that you follow-up with your pharmacy.

## 2020-05-07 NOTE — Telephone Encounter (Signed)
Call to patient- appointment scheduled for medication follow up- she has 2 pills left- she will discuss RF at appointment

## 2020-05-08 ENCOUNTER — Encounter: Payer: Self-pay | Admitting: Family Medicine

## 2020-05-08 ENCOUNTER — Ambulatory Visit: Payer: BC Managed Care – PPO | Admitting: Family Medicine

## 2020-05-08 ENCOUNTER — Other Ambulatory Visit: Payer: Self-pay

## 2020-05-08 VITALS — BP 130/80 | HR 68 | Ht 67.0 in | Wt 282.0 lb

## 2020-05-08 DIAGNOSIS — I10 Essential (primary) hypertension: Secondary | ICD-10-CM

## 2020-05-08 MED ORDER — HYDROCHLOROTHIAZIDE 12.5 MG PO TABS: 12.5000 mg | ORAL_TABLET | Freq: Every day | ORAL | 1 refills | Status: DC

## 2020-05-08 MED ORDER — AMLODIPINE BESYLATE 5 MG PO TABS: 5.0000 mg | ORAL_TABLET | Freq: Every day | ORAL | 1 refills | Status: DC

## 2020-05-08 NOTE — Progress Notes (Signed)
Date:  05/08/2020   Name:  Theresa Best   DOB:  [birth year]   MRN:  [id number]   Chief Complaint: Hypertension  Hypertension This is a chronic problem. The current episode started more than 1 year ago. The problem has been waxing and waning since onset. The problem is controlled. Pertinent negatives include no anxiety, blurred vision, chest pain, headaches, malaise/fatigue, neck pain, orthopnea, palpitations, peripheral edema, PND, shortness of breath or sweats. There are no associated agents to hypertension. Past treatments include calcium channel blockers and diuretics. The current treatment provides moderate improvement. There are no compliance problems.  There is no history of angina, kidney disease, CAD/MI, CVA, heart failure, left ventricular hypertrophy, PVD or retinopathy. There is no history of chronic renal disease, a hypertension causing med or renovascular disease.    Lab Results  Component Value Date   CREATININE 0.60 07/22/2019   BUN 13 07/22/2019   NA 139 07/22/2019   K 4.6 07/22/2019   CL 100 07/22/2019   CO2 23 07/22/2019   Lab Results  Component Value Date   CHOL 279 (H) 07/22/2019   HDL 50 07/22/2019   LDLCALC 205 (H) 07/22/2019   TRIG 132 07/22/2019   CHOLHDL 5.3 (H) 07/09/2018   No results found for: TSH No results found for: HGBA1C No results found for: WBC, HGB, HCT, MCV, PLT No results found for: ALT, AST, GGT, ALKPHOS, BILITOT   Review of Systems  Constitutional: Negative.  Negative for chills, fatigue, fever, malaise/fatigue and unexpected weight change.  HENT: Negative for congestion, ear discharge, ear pain, rhinorrhea, sinus pressure, sneezing and sore throat.   Eyes: Negative for blurred vision, photophobia, pain, discharge, redness and itching.  Respiratory: Negative for cough, shortness of breath, wheezing and stridor.   Cardiovascular: Negative for chest pain, palpitations, orthopnea and PND.  Gastrointestinal: Negative for abdominal  pain, blood in stool, constipation, diarrhea, nausea and vomiting.  Endocrine: Negative for cold intolerance, heat intolerance, polydipsia, polyphagia and polyuria.  Genitourinary: Negative for dysuria, flank pain, frequency, hematuria, menstrual problem, pelvic pain, urgency, vaginal bleeding and vaginal discharge.  Musculoskeletal: Negative for arthralgias, back pain, myalgias and neck pain.  Skin: Negative for rash.  Allergic/Immunologic: Negative for environmental allergies and food allergies.  Neurological: Negative for dizziness, weakness, light-headedness, numbness and headaches.  Hematological: Negative for adenopathy. Does not bruise/bleed easily.  Psychiatric/Behavioral: Negative for dysphoric mood. The patient is not nervous/anxious.     Patient Active Problem List   Diagnosis Date Noted  . Varicose veins of right lower extremity with pain 07/22/2019  . Familial hypercholesterolemia 07/22/2019  . Uterine prolapse 08/16/2018  . SUI (stress urinary incontinence, female) 08/16/2018  . POP-Q stage 2 cystocele 08/16/2018  . Essential hypertension 11/03/2016  . Class 3 severe obesity due to excess calories with serious comorbidity and body mass index (BMI) of 40.0 to 44.9 in adult [facility]) 11/03/2016    No Known Allergies  No past surgical history on file.  Social History   Tobacco Use  . Smoking status: Never Smoker  . Smokeless tobacco: Never Used  Substance Use Topics  . Alcohol use: No    Alcohol/week: 0.0 standard drinks  . Drug use: No     Medication list has been reviewed and updated.  Current Meds  Medication Sig  . albuterol (VENTOLIN HFA) 108 (90 Base) MCG/ACT inhaler Inhale 2 puffs into the lungs every 6 (six) hours as needed for wheezing or shortness of breath.  Marland Kitchen amLODipine (NORVASC) 5 MG tablet  Take 1 tablet by mouth once daily  . hydrochlorothiazide (HYDRODIURIL) 12.5 MG tablet Take 1 tablet (12.5 mg total) by mouth daily.  . [DISCONTINUED] famotidine  (PEPCID) 40 MG tablet Take 1 tablet (40 mg total) by mouth daily.    PHQ 2/9 Scores 05/08/2020 07/22/2019 06/19/2019 05/14/2019  PHQ - 2 Score 0 0 0 0  PHQ- 9 Score 0 0 0 0    GAD 7 : Generalized Anxiety Score 05/08/2020 07/22/2019 06/19/2019  Nervous, Anxious, on Edge 0 0 0  Control/stop worrying 0 0 0  Worry too much - different things 0 0 0  Trouble relaxing 0 0 0  Restless 0 0 0  Easily annoyed or irritable 0 0 0  Afraid - awful might happen 0 0 0  Total GAD 7 Score 0 0 0    BP Readings from Last 3 Encounters:  05/08/20 130/80  07/22/19 124/78  06/19/19 138/80    Physical Exam  Wt Readings from Last 3 Encounters:  05/08/20 282 lb (127.9 kg)  07/22/19 281 lb (127.5 kg)  06/19/19 282 lb (127.9 kg)    BP 130/80   Pulse 68   Ht 5\' 7"  (1.702 m)   Wt 282 lb (127.9 kg)   BMI 44.17 kg/m   Assessment and Plan: 1. Essential hypertension Chronic.  Controlled.  Stable.  Continue hydrochlorothiazide 12.5 mg once a day and amlodipine 5 mg once a day.  Will check renal function panel for electrolytes and GFR. - Renal function panel - hydrochlorothiazide (HYDRODIURIL) 12.5 MG tablet; Take 1 tablet (12.5 mg total) by mouth daily.  Dispense: 90 tablet; Refill: 1 - amLODipine (NORVASC) 5 MG tablet; Take 1 tablet (5 mg total) by mouth daily.  Dispense: 90 tablet; Refill: 1

## 2020-05-09 LAB — RENAL FUNCTION PANEL
Albumin: 4.3 g/dL (ref 3.8–4.9)
BUN/Creatinine Ratio: 24 — ABNORMAL HIGH (ref 9–23)
BUN: 16 mg/dL (ref 6–24)
CO2: 25 mmol/L (ref 20–29)
Calcium: 9.4 mg/dL (ref 8.7–10.2)
Chloride: 102 mmol/L (ref 96–106)
Creatinine, Ser: 0.67 mg/dL (ref 0.57–1.00)
GFR calc Af Amer: 112 mL/min/{1.73_m2} (ref >59)
GFR calc non Af Amer: 97 mL/min/{1.73_m2} (ref >59)
Glucose: 101 mg/dL — ABNORMAL HIGH (ref 65–99)
Phosphorus: 3.6 mg/dL (ref 3.0–4.3)
Potassium: 4.2 mmol/L (ref 3.5–5.2)
Sodium: 139 mmol/L (ref 134–144)

## 2020-05-11 ENCOUNTER — Other Ambulatory Visit: Payer: Self-pay

## 2020-05-11 ENCOUNTER — Ambulatory Visit
Admission: RE | Admit: 2020-05-11 | Discharge: 2020-05-11 | Disposition: A | Discharge status: Home / Self Care | Payer: BC Managed Care – PPO | Source: Ambulatory Visit | Attending: Family Medicine | Admitting: Family Medicine

## 2020-05-11 DIAGNOSIS — Z1231 Encounter for screening mammogram for malignant neoplasm of breast: Secondary | ICD-10-CM | POA: Diagnosis not present

## 2020-06-25 ENCOUNTER — Ambulatory Visit: Payer: BC Managed Care – PPO

## 2020-09-03 ENCOUNTER — Ambulatory Visit: Payer: BC Managed Care – PPO | Admitting: Family Medicine

## 2020-10-13 ENCOUNTER — Other Ambulatory Visit: Payer: Self-pay

## 2020-10-13 ENCOUNTER — Ambulatory Visit: Payer: BC Managed Care – PPO | Admitting: Family Medicine

## 2020-10-13 ENCOUNTER — Encounter: Payer: Self-pay | Admitting: Family Medicine

## 2020-10-13 ENCOUNTER — Telehealth: Payer: Self-pay | Admitting: Family Medicine

## 2020-10-13 VITALS — BP 138/70 | HR 68 | Temp 98.0°F | Ht 67.0 in | Wt 279.0 lb

## 2020-10-13 DIAGNOSIS — J301 Allergic rhinitis due to pollen: Secondary | ICD-10-CM | POA: Diagnosis not present

## 2020-10-13 DIAGNOSIS — J4521 Mild intermittent asthma with (acute) exacerbation: Secondary | ICD-10-CM

## 2020-10-13 DIAGNOSIS — J0100 Acute maxillary sinusitis, unspecified: Secondary | ICD-10-CM

## 2020-10-13 DIAGNOSIS — R053 Chronic cough: Secondary | ICD-10-CM

## 2020-10-13 MED ORDER — PREDNISONE 10 MG PO TABS: 10.0000 mg | ORAL_TABLET | Freq: Every day | ORAL | 0 refills | Status: DC

## 2020-10-13 MED ORDER — MONTELUKAST SODIUM 10 MG PO TABS: 10.0000 mg | ORAL_TABLET | Freq: Every day | ORAL | 3 refills | Status: DC

## 2020-10-13 MED ORDER — AMOXICILLIN 500 MG PO CAPS: 500.0000 mg | ORAL_CAPSULE | Freq: Three times a day (TID) | ORAL | 0 refills | Status: AC

## 2020-10-13 MED ORDER — GUAIFENESIN-CODEINE 100-10 MG/5ML PO SYRP: 5.0000 mL | ORAL_SOLUTION | Freq: Four times a day (QID) | ORAL | 0 refills | Status: DC | PRN

## 2020-10-13 NOTE — Progress Notes (Signed)
Date:  10/13/2020   Name:  Theresa Best   DOB:  1960-09-07   MRN:  [id number]   Chief Complaint: Sinusitis (Cough with yellow production, sweats, no fever that she knows of)  Sinusitis This is a chronic problem. The current episode started in the past 7 days. The problem has been gradually improving since onset. There has been no fever. The pain is moderate. Associated symptoms include chills, congestion, coughing, headaches, a hoarse voice, sinus pressure, sneezing and a sore throat. Pertinent negatives include no diaphoresis, ear pain, neck pain, shortness of breath or swollen glands. Past treatments include oral decongestants.    Lab Results  Component Value Date   CREATININE 0.67 05/08/2020   BUN 16 05/08/2020   NA 139 05/08/2020   K 4.2 05/08/2020   CL 102 05/08/2020   CO2 25 05/08/2020   Lab Results  Component Value Date   CHOL 279 (H) 07/22/2019   HDL 50 07/22/2019   LDLCALC 205 (H) 07/22/2019   TRIG 132 07/22/2019   CHOLHDL 5.3 (H) 07/09/2018   No results found for: TSH No results found for: HGBA1C No results found for: WBC, HGB, HCT, MCV, PLT No results found for: ALT, AST, GGT, ALKPHOS, BILITOT   Review of Systems  Constitutional: Positive for chills. Negative for diaphoresis, fatigue, fever and unexpected weight change.  HENT: Positive for congestion, hoarse voice, sinus pressure, sneezing and sore throat. Negative for ear discharge, ear pain and rhinorrhea.   Eyes: Negative for photophobia, pain, discharge, redness and itching.  Respiratory: Positive for cough. Negative for shortness of breath, wheezing and stridor.   Gastrointestinal: Negative for abdominal pain, blood in stool, constipation, diarrhea, nausea and vomiting.  Endocrine: Negative for cold intolerance, heat intolerance, polydipsia, polyphagia and polyuria.  Genitourinary: Negative for dysuria, flank pain, frequency, hematuria, menstrual problem, pelvic pain, urgency, vaginal bleeding and  vaginal discharge.  Musculoskeletal: Negative for arthralgias, back pain, myalgias and neck pain.  Skin: Negative for rash.  Allergic/Immunologic: Negative for environmental allergies and food allergies.  Neurological: Positive for headaches. Negative for dizziness, weakness, light-headedness and numbness.  Hematological: Negative for adenopathy. Does not bruise/bleed easily.  Psychiatric/Behavioral: Negative for dysphoric mood. The patient is not nervous/anxious.     Patient Active Problem List   Diagnosis Date Noted  . Varicose veins of right lower extremity with pain 07/22/2019  . Familial hypercholesterolemia 07/22/2019  . Uterine prolapse 08/16/2018  . SUI (stress urinary incontinence, female) 08/16/2018  . POP-Q stage 2 cystocele 08/16/2018  . Essential hypertension 11/03/2016  . Class 3 severe obesity due to excess calories with serious comorbidity and body mass index (BMI) of 40.0 to 44.9 in adult [facility]) 11/03/2016    No Known Allergies  No past surgical history on file.  Social History   Tobacco Use  . Smoking status: Never Smoker  . Smokeless tobacco: Never Used  Substance Use Topics  . Alcohol use: No    Alcohol/week: 0.0 standard drinks  . Drug use: No     Medication list has been reviewed and updated.  Current Meds  Medication Sig  . albuterol (VENTOLIN HFA) 108 (90 Base) MCG/ACT inhaler Inhale 2 puffs into the lungs every 6 (six) hours as needed for wheezing or shortness of breath.  Marland Kitchen amLODipine (NORVASC) 5 MG tablet Take 1 tablet (5 mg total) by mouth daily.  . hydrochlorothiazide (HYDRODIURIL) 12.5 MG tablet Take 1 tablet (12.5 mg total) by mouth daily.    PHQ 2/9 Scores 10/13/2020 05/08/2020 07/22/2019  06/19/2019  PHQ - 2 Score 0 0 0 0  PHQ- 9 Score 0 0 0 0    GAD 7 : Generalized Anxiety Score 10/13/2020 05/08/2020 07/22/2019 06/19/2019  Nervous, Anxious, on Edge 0 0 0 0  Control/stop worrying 0 0 0 0  Worry too much - different things 0 0 0 0  Trouble  relaxing 0 0 0 0  Restless 0 0 0 0  Easily annoyed or irritable 0 0 0 0  Afraid - awful might happen 0 0 0 0  Total GAD 7 Score 0 0 0 0    BP Readings from Last 3 Encounters:  10/13/20 138/70  05/08/20 130/80  07/22/19 124/78    Physical Exam Vitals and nursing note reviewed.  Constitutional:      Appearance: She is well-developed.  HENT:     Head: Normocephalic.     Right Ear: Tympanic membrane, ear canal and external ear normal.     Left Ear: Tympanic membrane, ear canal and external ear normal.  Eyes:     General: Lids are everted, no foreign bodies appreciated. No scleral icterus.       Left eye: No foreign body or hordeolum.     Conjunctiva/sclera: Conjunctivae normal.     Right eye: Right conjunctiva is not injected.     Left eye: Left conjunctiva is not injected.     Pupils: Pupils are equal, round, and reactive to light.  Neck:     Thyroid: No thyromegaly.     Vascular: No JVD.     Trachea: No tracheal deviation.  Cardiovascular:     Rate and Rhythm: Normal rate and regular rhythm.     Heart sounds: Normal heart sounds. No murmur heard. No friction rub. No gallop.   Pulmonary:     Effort: Pulmonary effort is normal. No respiratory distress.     Breath sounds: Normal breath sounds. No wheezing, rhonchi or rales.  Abdominal:     General: Bowel sounds are normal.     Palpations: Abdomen is soft. There is no mass.     Tenderness: There is no abdominal tenderness. There is no guarding or rebound.  Musculoskeletal:        General: No tenderness. Normal range of motion.     Cervical back: Normal range of motion and neck supple.  Lymphadenopathy:     Cervical: No cervical adenopathy.  Skin:    General: Skin is warm.     Findings: No rash.  Neurological:     Mental Status: She is alert and oriented to person, place, and time.     Cranial Nerves: No cranial nerve deficit.     Deep Tendon Reflexes: Reflexes normal.  Psychiatric:        Mood and Affect: Mood is not  anxious or depressed.     Wt Readings from Last 3 Encounters:  10/13/20 279 lb (126.6 kg)  05/08/20 282 lb (127.9 kg)  07/22/19 281 lb (127.5 kg)    BP 138/70   Pulse 68   Temp 98 F (36.7 C) (Oral)   Ht 5\' 7"  (1.702 m)   Wt 279 lb (126.6 kg)   SpO2 97%   BMI 43.70 kg/m   Assessment and Plan: 1. Acute maxillary sinusitis, recurrence not specified New onset.  Persistent.  Stable.  However she has tenderness over the maxillary bilateral this is consistent with history and physical exam for an acute maxillary sinusitis and we will initiate amoxicillin 500 mg 3 times a day  for 10 days. - amoxicillin (AMOXIL) 500 MG capsule; Take 1 capsule (500 mg total) by mouth 3 (three) times daily for 10 days.  Dispense: 30 capsule; Refill: 0  2. Mild intermittent reactive airway disease with acute exacerbation Chronic.  Uncontrolled.  Patient has persistent cough and with some increase in expiratory to inspiratory ratio.  We will treat with prednisone 10 mg once a day.  And Singulair 10 mg once a day. - predniSONE (DELTASONE) 10 MG tablet; Take 1 tablet (10 mg total) by mouth daily with breakfast.  Dispense: 30 tablet; Refill: 0 - montelukast (SINGULAIR) 10 MG tablet; Take 1 tablet (10 mg total) by mouth at bedtime.  Dispense: 30 tablet; Refill: 3  3. Chronic cough Patient has chronic cough and we will cover during the day with Mucinex DM and at night Robitussin-AC 1 teaspoon as needed. - guaiFENesin-codeine (ROBITUSSIN AC) 100-10 MG/5ML syrup; Take 5 mLs by mouth 4 (four) times daily as needed for cough.  Dispense: 118 mL; Refill: 0  4. Seasonal allergic rhinitis due to pollen Quadruple therapy consisting of nasal steroid such as fluticasone or Flonase, Sudafed decongestant with and without antihistamine such as Claritin, Mucinex for mucolytic agent, and saline nasal spray to cleanse nasal passages.

## 2020-10-26 ENCOUNTER — Telehealth: Payer: Self-pay

## 2020-10-26 NOTE — Telephone Encounter (Signed)
Will see tomorrow

## 2020-10-26 NOTE — Telephone Encounter (Signed)
Copied from CRM [id number]. Topic: General - Other >> Oct 26, 2020  3:18 PM Wyonia Hough E wrote: Reason for CRM: Pt called to speak with Tara/ Pt states its abou the cold she has/ I asked her about her syptoms and she stated she wants to talk to Delice Bison about it/ please advise

## 2020-10-27 ENCOUNTER — Other Ambulatory Visit: Payer: Self-pay

## 2020-10-27 ENCOUNTER — Ambulatory Visit
Admission: RE | Admit: 2020-10-27 | Discharge: 2020-10-27 | Disposition: A | Discharge status: Home / Self Care | Payer: BC Managed Care – PPO | Source: Ambulatory Visit | Attending: Family Medicine | Admitting: Family Medicine

## 2020-10-27 ENCOUNTER — Ambulatory Visit
Admission: RE | Admit: 2020-10-27 | Discharge: 2020-10-27 | Disposition: A | Discharge status: Home / Self Care | Payer: BC Managed Care – PPO | Attending: Family Medicine | Admitting: Family Medicine

## 2020-10-27 ENCOUNTER — Ambulatory Visit: Payer: BC Managed Care – PPO | Admitting: Family Medicine

## 2020-10-27 VITALS — BP 150/82 | HR 102 | Temp 99.0°F | Ht 67.0 in | Wt 273.0 lb

## 2020-10-27 DIAGNOSIS — R053 Chronic cough: Secondary | ICD-10-CM

## 2020-10-27 DIAGNOSIS — J4521 Mild intermittent asthma with (acute) exacerbation: Secondary | ICD-10-CM

## 2020-10-27 MED ORDER — DOXYCYCLINE HYCLATE 100 MG PO TABS: 100.0000 mg | ORAL_TABLET | Freq: Two times a day (BID) | ORAL | 0 refills | Status: DC

## 2020-10-27 MED ORDER — GUAIFENESIN-CODEINE 100-10 MG/5ML PO SYRP: 5.0000 mL | ORAL_SOLUTION | Freq: Four times a day (QID) | ORAL | 0 refills | Status: DC | PRN

## 2020-10-27 NOTE — Telephone Encounter (Signed)
Tried calling pt back- no vm to leave a message

## 2020-10-27 NOTE — Telephone Encounter (Signed)
Called pt back.

## 2020-10-27 NOTE — Progress Notes (Addendum)
Date:  10/27/2020   Name:  Theresa Best   DOB:  [birth year]   MRN:  [id number]   Chief Complaint: Cough  Cough This is a new problem. The current episode started 1 to 4 weeks ago (2 weeks). The problem has been unchanged. The problem occurs every few minutes. The cough is productive of purulent sputum. Associated symptoms include chills, a fever, headaches, myalgias, nasal congestion, postnasal drip, rhinorrhea, a sore throat and sweats. Pertinent negatives include no chest pain, ear congestion, ear pain, eye redness, heartburn, hemoptysis, rash, shortness of breath or wheezing. She has tried OTC cough suppressant and prescription cough suppressant for the symptoms. The treatment provided moderate relief. There is no history of environmental allergies.    Lab Results  Component Value Date   CREATININE 0.67 05/08/2020   BUN 16 05/08/2020   NA 139 05/08/2020   K 4.2 05/08/2020   CL 102 05/08/2020   CO2 25 05/08/2020   Lab Results  Component Value Date   CHOL 279 (H) 07/22/2019   HDL 50 07/22/2019   LDLCALC 205 (H) 07/22/2019   TRIG 132 07/22/2019   CHOLHDL 5.3 (H) 07/09/2018   No results found for: TSH No results found for: HGBA1C No results found for: WBC, HGB, HCT, MCV, PLT No results found for: ALT, AST, GGT, ALKPHOS, BILITOT   Review of Systems  Constitutional: Positive for chills and fever. Negative for fatigue and unexpected weight change.  HENT: Positive for postnasal drip, rhinorrhea and sore throat. Negative for congestion, ear discharge, ear pain, sinus pressure and sneezing.   Eyes: Negative for photophobia, pain, discharge, redness and itching.  Respiratory: Positive for cough. Negative for hemoptysis, shortness of breath, wheezing and stridor.   Cardiovascular: Negative for chest pain.  Gastrointestinal: Negative for abdominal pain, blood in stool, constipation, diarrhea, heartburn, nausea and vomiting.  Endocrine: Negative for cold intolerance, heat  intolerance, polydipsia, polyphagia and polyuria.  Genitourinary: Negative for dysuria, flank pain, frequency, hematuria, menstrual problem, pelvic pain, urgency, vaginal bleeding and vaginal discharge.  Musculoskeletal: Positive for myalgias. Negative for arthralgias and back pain.  Skin: Negative for rash.  Allergic/Immunologic: Negative for environmental allergies and food allergies.  Neurological: Positive for headaches. Negative for dizziness, weakness, light-headedness and numbness.  Hematological: Negative for adenopathy. Does not bruise/bleed easily.  Psychiatric/Behavioral: Negative for dysphoric mood. The patient is not nervous/anxious.     Patient Active Problem List   Diagnosis Date Noted  . Varicose veins of right lower extremity with pain 07/22/2019  . Familial hypercholesterolemia 07/22/2019  . Uterine prolapse 08/16/2018  . SUI (stress urinary incontinence, female) 08/16/2018  . POP-Q stage 2 cystocele 08/16/2018  . Essential hypertension 11/03/2016  . Class 3 severe obesity due to excess calories with serious comorbidity and body mass index (BMI) of 40.0 to 44.9 in adult [facility]) 11/03/2016    No Known Allergies  No past surgical history on file.  Social History   Tobacco Use  . Smoking status: Never Smoker  . Smokeless tobacco: Never Used  Substance Use Topics  . Alcohol use: No    Alcohol/week: 0.0 standard drinks  . Drug use: No     Medication list has been reviewed and updated.  Current Meds  Medication Sig  . albuterol (VENTOLIN HFA) 108 (90 Base) MCG/ACT inhaler Inhale 2 puffs into the lungs every 6 (six) hours as needed for wheezing or shortness of breath.  Marland Kitchen amLODipine (NORVASC) 5 MG tablet Take 1 tablet (5 mg total) by mouth  daily.  . hydrochlorothiazide (HYDRODIURIL) 12.5 MG tablet Take 1 tablet (12.5 mg total) by mouth daily.  . montelukast (SINGULAIR) 10 MG tablet Take 1 tablet (10 mg total) by mouth at bedtime.  . predniSONE (DELTASONE) 10 MG  tablet Take 1 tablet (10 mg total) by mouth daily with breakfast.    PHQ 2/9 Scores 10/13/2020 05/08/2020 07/22/2019 06/19/2019  PHQ - 2 Score 0 0 0 0  PHQ- 9 Score 0 0 0 0    GAD 7 : Generalized Anxiety Score 10/13/2020 05/08/2020 07/22/2019 06/19/2019  Nervous, Anxious, on Edge 0 0 0 0  Control/stop worrying 0 0 0 0  Worry too much - different things 0 0 0 0  Trouble relaxing 0 0 0 0  Restless 0 0 0 0  Easily annoyed or irritable 0 0 0 0  Afraid - awful might happen 0 0 0 0  Total GAD 7 Score 0 0 0 0    BP Readings from Last 3 Encounters:  10/27/20 (!) 150/82  10/13/20 138/70  05/08/20 130/80    Physical Exam Vitals and nursing note reviewed.  Constitutional:      Appearance: She is well-developed.  HENT:     Head: Normocephalic.     Right Ear: Tympanic membrane, ear canal and external ear normal. There is no impacted cerumen.     Left Ear: Tympanic membrane, ear canal and external ear normal. There is no impacted cerumen.     Nose: Nose normal. No congestion or rhinorrhea.     Mouth/Throat:     Mouth: Mucous membranes are moist.  Eyes:     General: Lids are everted, no foreign bodies appreciated. No scleral icterus.       Left eye: No foreign body or hordeolum.     Conjunctiva/sclera: Conjunctivae normal.     Right eye: Right conjunctiva is not injected.     Left eye: Left conjunctiva is not injected.     Pupils: Pupils are equal, round, and reactive to light.  Neck:     Thyroid: No thyromegaly.     Vascular: No JVD.     Trachea: No tracheal deviation.  Cardiovascular:     Rate and Rhythm: Normal rate and regular rhythm.     Pulses: Normal pulses.     Heart sounds: Normal heart sounds, S1 normal and S2 normal. No murmur heard.  No systolic murmur is present.  No diastolic murmur is present. No friction rub. No gallop. No S3 or S4 sounds.   Pulmonary:     Effort: Pulmonary effort is normal. No respiratory distress.     Breath sounds: No stridor or decreased air  movement. Examination of the right-lower field reveals decreased breath sounds. Examination of the left-lower field reveals decreased breath sounds. Decreased breath sounds present. No wheezing, rhonchi or rales.  Chest:     Chest wall: No tenderness.  Abdominal:     General: Bowel sounds are normal.     Palpations: Abdomen is soft. There is no mass.     Tenderness: There is no abdominal tenderness. There is no guarding or rebound.  Musculoskeletal:        General: No tenderness. Normal range of motion.     Cervical back: Normal range of motion and neck supple.  Lymphadenopathy:     Cervical: No cervical adenopathy.  Skin:    General: Skin is warm.     Capillary Refill: Capillary refill takes less than 2 seconds.     Coloration: Skin is  not pale.     Findings: No erythema or rash.  Neurological:     Mental Status: She is alert and oriented to person, place, and time.     Cranial Nerves: No cranial nerve deficit.     Deep Tendon Reflexes: Reflexes normal.  Psychiatric:        Mood and Affect: Mood is not anxious or depressed.     Wt Readings from Last 3 Encounters:  10/27/20 273 lb (123.8 kg)  10/13/20 279 lb (126.6 kg)  05/08/20 282 lb (127.9 kg)    BP (!) 150/82   Pulse (!) 102   Temp 99 F (37.2 C) (Oral)   Ht 5\' 7"  (1.702 m)   Wt 273 lb (123.8 kg)   SpO2 96%   BMI 42.76 kg/m   Assessment and Plan:  1. Persistent cough for 3 weeks or longer Patient initially had a decrease in her cough but then resumed again with low-grade fever.  There is slight productivity of her cough but no significant shortness of breath.  Previously was treated with amoxicillin and we will switch to doxycycline 100 mg 1 twice a day.  We will obtain a stat chest x-ray to rule out any pneumonia that may be suggestive of a COVID infection.  Patient's x-ray has been reviewed and there is no cardiopulmonary concerns at this time.  We will check for COVID possibilities and cough preparation with  codeine has been refilled primarily for nighttime use. - DG Chest 2 View; Future - doxycycline (VIBRA-TABS) 100 MG tablet; Take 1 tablet (100 mg total) by mouth 2 (two) times daily.  Dispense: 20 tablet; Refill: 0 - guaiFENesin-codeine (ROBITUSSIN AC) 100-10 MG/5ML syrup; Take 5 mLs by mouth 4 (four) times daily as needed for cough.  Dispense: 118 mL; Refill: 0 - Novel Coronavirus, NAA ([facility])  2. Mild intermittent reactive airway disease with acute exacerbation Chronic.  Controlled.  Stable.  Has been pointed out that patient does have some increased reactivity of her airways.  We will continue with albuterol 2 puffs every 6 hours as well as prednisone 10 mg daily. - Novel Coronavirus, NAA ([facility])

## 2020-10-27 NOTE — Telephone Encounter (Signed)
Pt returned Taras call/ mentioned changing appt

## 2020-10-27 NOTE — Telephone Encounter (Signed)
Pt called asking if Theresa Best would v\call her back please  CB#  [phone]

## 2020-10-28 ENCOUNTER — Other Ambulatory Visit: Payer: Self-pay

## 2020-10-28 ENCOUNTER — Encounter: Payer: Self-pay | Admitting: Family Medicine

## 2020-10-28 DIAGNOSIS — J4521 Mild intermittent asthma with (acute) exacerbation: Secondary | ICD-10-CM

## 2020-10-28 DIAGNOSIS — R053 Chronic cough: Secondary | ICD-10-CM

## 2020-10-28 LAB — SARS-COV-2, NAA 2 DAY TAT

## 2020-10-28 LAB — NOVEL CORONAVIRUS, NAA: SARS-CoV-2, NAA: NOT DETECTED

## 2020-10-28 MED ORDER — BUDESONIDE-FORMOTEROL FUMARATE 80-4.5 MCG/ACT IN AERO: 2.0000 | INHALATION_SPRAY | Freq: Two times a day (BID) | RESPIRATORY_TRACT | 1 refills | Status: DC

## 2020-10-28 NOTE — Progress Notes (Signed)
symbicort sent in. 

## 2020-10-30 ENCOUNTER — Telehealth: Payer: Self-pay

## 2020-10-30 ENCOUNTER — Other Ambulatory Visit: Payer: Self-pay | Admitting: Family Medicine

## 2020-10-30 DIAGNOSIS — I10 Essential (primary) hypertension: Secondary | ICD-10-CM

## 2020-10-30 NOTE — Telephone Encounter (Signed)
Copied from CRM [id number]. Topic: General - Call Back - No Documentation >> Oct 30, 2020 12:09 PM Randol Kern wrote: Reason for CRM: Pt is requesting a call back from a nurse, did not disclose why when asked.  Best contact:  [phone]

## 2020-11-06 ENCOUNTER — Ambulatory Visit: Payer: BC Managed Care – PPO | Admitting: Family Medicine

## 2021-01-08 ENCOUNTER — Other Ambulatory Visit: Payer: Self-pay

## 2021-01-08 ENCOUNTER — Ambulatory Visit: Payer: BC Managed Care – PPO | Admitting: Family Medicine

## 2021-01-08 ENCOUNTER — Encounter: Payer: Self-pay | Admitting: Family Medicine

## 2021-01-08 ENCOUNTER — Telehealth: Payer: Self-pay

## 2021-01-08 VITALS — BP 130/80 | HR 72 | Temp 99.1°F | Ht 67.0 in | Wt 279.0 lb

## 2021-01-08 DIAGNOSIS — R059 Cough, unspecified: Secondary | ICD-10-CM

## 2021-01-08 DIAGNOSIS — R053 Chronic cough: Secondary | ICD-10-CM

## 2021-01-08 DIAGNOSIS — H1031 Unspecified acute conjunctivitis, right eye: Secondary | ICD-10-CM

## 2021-01-08 DIAGNOSIS — J0100 Acute maxillary sinusitis, unspecified: Secondary | ICD-10-CM | POA: Diagnosis not present

## 2021-01-08 DIAGNOSIS — J4521 Mild intermittent asthma with (acute) exacerbation: Secondary | ICD-10-CM

## 2021-01-08 MED ORDER — SULFACETAMIDE SODIUM 10 % OP SOLN: 1.0000 [drp] | OPHTHALMIC | 0 refills | Status: DC

## 2021-01-08 MED ORDER — ALBUTEROL SULFATE (2.5 MG/3ML) 0.083% IN NEBU: 2.5000 mg | INHALATION_SOLUTION | Freq: Four times a day (QID) | RESPIRATORY_TRACT | 1 refills | Status: DC | PRN

## 2021-01-08 MED ORDER — GUAIFENESIN-CODEINE 100-10 MG/5ML PO SYRP: 5.0000 mL | ORAL_SOLUTION | Freq: Four times a day (QID) | ORAL | 0 refills | Status: DC | PRN

## 2021-01-08 MED ORDER — AMOXICILLIN-POT CLAVULANATE 875-125 MG PO TABS: 1.0000 | ORAL_TABLET | Freq: Two times a day (BID) | ORAL | 0 refills | Status: DC

## 2021-01-08 NOTE — Telephone Encounter (Unsigned)
Copied from CRM [id number]. Topic: General - Call Back - No Documentation >> Jan 08, 2021  8:05 AM Crist Infante wrote: Reason for CRM: pt states she needs to speak with Delice Bison.  Pt declined when asked to provide any other information.

## 2021-01-08 NOTE — Progress Notes (Signed)
Date:  01/08/2021   Name:  Theresa Best   DOB:  [birth year]   MRN:  [id number]   Chief Complaint: Cough (Congestion and cough x 2 days. Yesterday had headache and eyes started running)  Cough This is a new problem. The current episode started in the past 7 days. The problem has been gradually improving. The problem occurs every few minutes. The cough is Non-productive. Associated symptoms include chills, ear pain, eye redness, a fever, nasal congestion, postnasal drip, rhinorrhea and a sore throat. Pertinent negatives include no chest pain, ear congestion, headaches, heartburn, hemoptysis, myalgias, rash, shortness of breath, sweats, weight loss or wheezing. There is no history of environmental allergies.  Conjunctivitis  The current episode started yesterday. The onset was gradual. The problem has been unchanged. Nothing relieves the symptoms. Associated symptoms include a fever, eye itching, congestion, ear pain, rhinorrhea, sore throat, cough, eye discharge and eye redness. Pertinent negatives include no decreased vision, no double vision, no abdominal pain, no constipation, no diarrhea, no nausea, no ear discharge, no headaches, no neck pain, no wheezing and no rash.  Sinusitis This is a new problem. The current episode started in the past 7 days. The problem has been gradually worsening since onset. Maximum temperature: 99.1. The fever has been present for Less than 1 day. Associated symptoms include chills, congestion, coughing, ear pain and a sore throat. Pertinent negatives include no headaches, neck pain or shortness of breath.   Lab Results  Component Value Date   CREATININE 0.67 05/08/2020   BUN 16 05/08/2020   NA 139 05/08/2020   K 4.2 05/08/2020   CL 102 05/08/2020   CO2 25 05/08/2020   Lab Results  Component Value Date   CHOL 279 (H) 07/22/2019   HDL 50 07/22/2019   LDLCALC 205 (H) 07/22/2019   TRIG 132 07/22/2019   CHOLHDL 5.3 (H) 07/09/2018   No results found for:  TSH No results found for: HGBA1C No results found for: WBC, HGB, HCT, MCV, PLT No results found for: ALT, AST, GGT, ALKPHOS, BILITOT   Review of Systems  Constitutional:  Positive for chills and fever. Negative for weight loss.  HENT:  Positive for congestion, ear pain, postnasal drip, rhinorrhea and sore throat. Negative for drooling and ear discharge.   Eyes:  Positive for discharge, redness and itching. Negative for double vision.  Respiratory:  Positive for cough. Negative for hemoptysis, shortness of breath and wheezing.   Cardiovascular:  Negative for chest pain, palpitations and leg swelling.  Gastrointestinal:  Negative for abdominal pain, blood in stool, constipation, diarrhea, heartburn and nausea.  Endocrine: Negative for polydipsia.  Genitourinary:  Negative for dysuria, frequency, hematuria and urgency.  Musculoskeletal:  Negative for back pain, myalgias and neck pain.  Skin:  Negative for rash.  Allergic/Immunologic: Negative for environmental allergies.  Neurological:  Negative for dizziness and headaches.  Hematological:  Does not bruise/bleed easily.  Psychiatric/Behavioral:  Negative for suicidal ideas. The patient is not nervous/anxious.    Patient Active Problem List   Diagnosis Date Noted   Varicose veins of right lower extremity with pain 07/22/2019   Familial hypercholesterolemia 07/22/2019   Uterine prolapse 08/16/2018   SUI (stress urinary incontinence, female) 08/16/2018   POP-Q stage 2 cystocele 08/16/2018   Essential hypertension 11/03/2016   Class 3 severe obesity due to excess calories with serious comorbidity and body mass index (BMI) of 40.0 to 44.9 in adult (HCC) 11/03/2016    No Known Allergies  No past  surgical history on file.  Social History   Tobacco Use   Smoking status: Never   Smokeless tobacco: Never  Substance Use Topics   Alcohol use: No    Alcohol/week: 0.0 standard drinks   Drug use: No     Medication list has been  reviewed and updated.  Current Meds  Medication Sig   albuterol (VENTOLIN HFA) 108 (90 Base) MCG/ACT inhaler Inhale 2 puffs into the lungs every 6 (six) hours as needed for wheezing or shortness of breath.   amLODipine (NORVASC) 5 MG tablet Take 1 tablet by mouth once daily   budesonide-formoterol (SYMBICORT) 80-4.5 MCG/ACT inhaler Inhale 2 puffs into the lungs 2 (two) times daily.   hydrochlorothiazide (HYDRODIURIL) 12.5 MG tablet Take 1 tablet by mouth once daily   montelukast (SINGULAIR) 10 MG tablet Take 1 tablet (10 mg total) by mouth at bedtime.    PHQ 2/9 Scores 10/13/2020 05/08/2020 07/22/2019 06/19/2019  PHQ - 2 Score 0 0 0 0  PHQ- 9 Score 0 0 0 0    GAD 7 : Generalized Anxiety Score 10/13/2020 05/08/2020 07/22/2019 06/19/2019  Nervous, Anxious, on Edge 0 0 0 0  Control/stop worrying 0 0 0 0  Worry too much - different things 0 0 0 0  Trouble relaxing 0 0 0 0  Restless 0 0 0 0  Easily annoyed or irritable 0 0 0 0  Afraid - awful might happen 0 0 0 0  Total GAD 7 Score 0 0 0 0    BP Readings from Last 3 Encounters:  01/08/21 130/80  10/27/20 (!) 150/82  10/13/20 138/70    Physical Exam Vitals and nursing note reviewed.  Constitutional:      Appearance: She is well-developed.  HENT:     Head: Normocephalic.     Right Ear: Tympanic membrane, ear canal and external ear normal. There is no impacted cerumen.     Left Ear: Tympanic membrane, ear canal and external ear normal. There is no impacted cerumen.     Nose: No congestion or rhinorrhea.  Eyes:     General: Lids are everted, no foreign bodies appreciated. No scleral icterus.       Right eye: Discharge present.        Left eye: No foreign body or hordeolum.     Conjunctiva/sclera:     Right eye: Right conjunctiva is injected.     Left eye: Left conjunctiva is not injected.     Pupils: Pupils are equal, round, and reactive to light.  Neck:     Thyroid: No thyromegaly.     Vascular: No JVD.     Trachea: No tracheal  deviation.  Cardiovascular:     Rate and Rhythm: Normal rate and regular rhythm.     Heart sounds: Normal heart sounds. No murmur heard.   No friction rub. No gallop.  Pulmonary:     Effort: Pulmonary effort is normal. No respiratory distress.     Breath sounds: Normal breath sounds. No wheezing, rhonchi or rales.  Abdominal:     General: Bowel sounds are normal.     Palpations: Abdomen is soft. There is no mass.     Tenderness: There is no abdominal tenderness. There is no guarding or rebound.  Musculoskeletal:        General: No tenderness. Normal range of motion.     Cervical back: Normal range of motion and neck supple. No tenderness.  Lymphadenopathy:     Cervical: No cervical adenopathy.  Skin:    General: Skin is warm.     Findings: No bruising, erythema, lesion or rash.  Neurological:     Mental Status: She is alert and oriented to person, place, and time.     Cranial Nerves: No cranial nerve deficit.     Deep Tendon Reflexes: Reflexes normal.  Psychiatric:        Mood and Affect: Mood is not anxious or depressed.    Wt Readings from Last 3 Encounters:  01/08/21 279 lb (126.6 kg)  10/27/20 273 lb (123.8 kg)  10/13/20 279 lb (126.6 kg)    BP 130/80   Pulse 72   Temp 99.1 F (37.3 C) (Oral)   Ht 5\' 7"  (1.702 m)   Wt 279 lb (126.6 kg)   SpO2 97%   BMI 43.70 kg/m   Assessment and Plan: 1. Acute maxillary sinusitis, recurrence not specified Acute.  Persistent.  Stable.  Patient has history and examination consistent with a sinus infection and we will began with treatment with Augmentin 875 mg twice a day. - amoxicillin-clavulanate (AUGMENTIN) 875-125 MG tablet; Take 1 tablet by mouth 2 (two) times daily.  Dispense: 20 tablet; Refill: 0  2. Acute bacterial conjunctivitis of right eye Patient also noticed that she had a drainage from her right eye and on exam conjunctiva was noted to be erythematous.  We will treat with sodium Supramid 10% ophthalmic drops 1 drop in  eye every 3 hours while awake. - sulfacetamide (BLEPH-10) 10 % ophthalmic solution; Place 1 drop into the right eye every 3 (three) hours.  Dispense: 15 mL; Refill: 0  3. Cough Patient's had persistent cough and we will treat with Robitussin-AC 1 teaspoon every 6 hours. - guaiFENesin-codeine (ROBITUSSIN AC) 100-10 MG/5ML syrup; Take 5 mLs by mouth 4 (four) times daily as needed for cough.  Dispense: 118 mL; Refill: 0

## 2021-01-08 NOTE — Telephone Encounter (Unsigned)
Copied from CRM [id number]. Topic: General - Other >> Jan 08, 2021  1:19 PM Jaquita Rector A wrote: Reason for CRM: Patient called in asking for a call back from Delice Bison say that its in reference to her visit this morning and can be reached at Ph# [phone]

## 2021-01-08 NOTE — Telephone Encounter (Signed)
Pt wanted me to tell Delice Bison the she is negative for Covid. I informed Delice Bison she verbalized understanding.  KP

## 2021-01-11 ENCOUNTER — Telehealth: Payer: Self-pay

## 2021-01-11 ENCOUNTER — Other Ambulatory Visit: Payer: Self-pay

## 2021-01-11 DIAGNOSIS — U071 COVID-19: Secondary | ICD-10-CM

## 2021-01-11 MED ORDER — MOLNUPIRAVIR EUA 200MG CAPSULE: 4.0000 | ORAL_CAPSULE | Freq: Two times a day (BID) | ORAL | 0 refills | Status: AC

## 2021-01-11 NOTE — Progress Notes (Signed)
Sent in [employer]

## 2021-01-11 NOTE — Telephone Encounter (Unsigned)
Copied from CRM [id number]. Topic: General - Other >> Jan 11, 2021  7:54 AM Leafy Ro wrote: Reason for CRM: Pt is calling and requesting tara to return her call. Pt does not want to elaborate the reason for the call

## 2021-01-29 ENCOUNTER — Ambulatory Visit: Payer: BC Managed Care – PPO | Admitting: Family Medicine

## 2021-01-29 ENCOUNTER — Other Ambulatory Visit: Payer: Self-pay

## 2021-01-29 ENCOUNTER — Encounter: Payer: Self-pay | Admitting: Family Medicine

## 2021-01-29 VITALS — BP 130/80 | HR 72 | Ht 67.0 in | Wt 277.0 lb

## 2021-01-29 DIAGNOSIS — E7801 Familial hypercholesterolemia: Secondary | ICD-10-CM

## 2021-01-29 DIAGNOSIS — R053 Chronic cough: Secondary | ICD-10-CM

## 2021-01-29 DIAGNOSIS — U099 Post covid-19 condition, unspecified: Secondary | ICD-10-CM | POA: Diagnosis not present

## 2021-01-29 DIAGNOSIS — I10 Essential (primary) hypertension: Secondary | ICD-10-CM | POA: Diagnosis not present

## 2021-01-29 MED ORDER — HYDROCHLOROTHIAZIDE 12.5 MG PO TABS: 12.5000 mg | ORAL_TABLET | Freq: Every day | ORAL | 1 refills | Status: DC

## 2021-01-29 MED ORDER — AMLODIPINE BESYLATE 5 MG PO TABS: 5.0000 mg | ORAL_TABLET | Freq: Every day | ORAL | 1 refills | Status: DC

## 2021-01-29 MED ORDER — GUAIFENESIN-CODEINE 100-10 MG/5ML PO SYRP: 5.0000 mL | ORAL_SOLUTION | Freq: Four times a day (QID) | ORAL | 0 refills | Status: DC | PRN

## 2021-01-29 MED ORDER — BENZONATATE 200 MG PO CAPS: 200.0000 mg | ORAL_CAPSULE | Freq: Two times a day (BID) | ORAL | 0 refills | Status: DC | PRN

## 2021-01-29 NOTE — Progress Notes (Signed)
Date:  01/29/2021   Name:  Theresa Best   DOB:  1960-08-12   MRN:  [id number]   Chief Complaint: Hypertension  HPI Hypertension  Lab Results  Component Value Date   CREATININE 0.67 05/08/2020   BUN 16 05/08/2020   NA 139 05/08/2020   K 4.2 05/08/2020   CL 102 05/08/2020   CO2 25 05/08/2020   Lab Results  Component Value Date   CHOL 279 (H) 07/22/2019   HDL 50 07/22/2019   LDLCALC 205 (H) 07/22/2019   TRIG 132 07/22/2019   CHOLHDL 5.3 (H) 07/09/2018   No results found for: TSH No results found for: HGBA1C No results found for: WBC, HGB, HCT, MCV, PLT No results found for: ALT, AST, GGT, ALKPHOS, BILITOT   Review of Systems  Patient Active Problem List   Diagnosis Date Noted   Varicose veins of right lower extremity with pain 07/22/2019   Familial hypercholesterolemia 07/22/2019   Uterine prolapse 08/16/2018   SUI (stress urinary incontinence, female) 08/16/2018   POP-Q stage 2 cystocele 08/16/2018   Essential hypertension 11/03/2016   Class 3 severe obesity due to excess calories with serious comorbidity and body mass index (BMI) of 40.0 to 44.9 in adult (HCC) 11/03/2016    No Known Allergies  History reviewed. No pertinent surgical history.  Social History   Tobacco Use   Smoking status: Never   Smokeless tobacco: Never  Substance Use Topics   Alcohol use: No    Alcohol/week: 0.0 standard drinks   Drug use: No     Medication list has been reviewed and updated.  Current Meds  Medication Sig   albuterol (PROVENTIL) (2.5 MG/3ML) 0.083% nebulizer solution Take 3 mLs (2.5 mg total) by nebulization every 6 (six) hours as needed for wheezing or shortness of breath.   albuterol (VENTOLIN HFA) 108 (90 Base) MCG/ACT inhaler Inhale 2 puffs into the lungs every 6 (six) hours as needed for wheezing or shortness of breath.   benzonatate (TESSALON) 200 MG capsule Take 1 capsule (200 mg total) by mouth 2 (two) times daily as needed for cough.    budesonide-formoterol (SYMBICORT) 80-4.5 MCG/ACT inhaler Inhale 2 puffs into the lungs 2 (two) times daily.   montelukast (SINGULAIR) 10 MG tablet Take 1 tablet (10 mg total) by mouth at bedtime.   sulfacetamide (BLEPH-10) 10 % ophthalmic solution Place 1 drop into the right eye every 3 (three) hours.   [DISCONTINUED] amLODipine (NORVASC) 5 MG tablet Take 1 tablet by mouth once daily   [DISCONTINUED] guaiFENesin-codeine (ROBITUSSIN AC) 100-10 MG/5ML syrup Take 5 mLs by mouth 4 (four) times daily as needed for cough.   [DISCONTINUED] hydrochlorothiazide (HYDRODIURIL) 12.5 MG tablet Take 1 tablet by mouth once daily    PHQ 2/9 Scores 01/29/2021 10/13/2020 05/08/2020 07/22/2019  PHQ - 2 Score 0 0 0 0  PHQ- 9 Score 0 0 0 0    GAD 7 : Generalized Anxiety Score 01/29/2021 10/13/2020 05/08/2020 07/22/2019  Nervous, Anxious, on Edge 0 0 0 0  Control/stop worrying 0 0 0 0  Worry too much - different things 0 0 0 0  Trouble relaxing 0 0 0 0  Restless 0 0 0 0  Easily annoyed or irritable 0 0 0 0  Afraid - awful might happen 0 0 0 0  Total GAD 7 Score 0 0 0 0    BP Readings from Last 3 Encounters:  01/29/21 130/80  01/08/21 130/80  10/27/20 (!) 150/82    Physical  Exam  Wt Readings from Last 3 Encounters:  01/29/21 277 lb (125.6 kg)  01/08/21 279 lb (126.6 kg)  10/27/20 273 lb (123.8 kg)    BP 130/80   Pulse 72   Ht 5\' 7"  (1.702 m)   Wt 277 lb (125.6 kg)   SpO2 97%   BMI 43.38 kg/m   Assessment and Plan:  1. Essential hypertension Chronic.  Controlled.  Stable.  Blood pressure reading is 130/80.  Continue with hydrochlorothiazide 12.5 mg once a day and amlodipine 5 mg once a day.  Will check renal function panel for GFR and electrolyte status. - Renal function panel - hydrochlorothiazide (HYDRODIURIL) 12.5 MG tablet; Take 1 tablet (12.5 mg total) by mouth daily.  Dispense: 90 tablet; Refill: 1 - amLODipine (NORVASC) 5 MG tablet; Take 1 tablet (5 mg total) by mouth daily.  Dispense: 90  tablet; Refill: 1  2. Post-COVID chronic cough Relatively new onset but persistent from the most recent infection of COVID.  Patient has been given Tessalon Perles 200 mg to take on a daily basis.  Patient is also been instructed to resume her albuterol inhaler and to take it on an daily basis as well. - Renal function panel - benzonatate (TESSALON) 200 MG capsule; Take 1 capsule (200 mg total) by mouth 2 (two) times daily as needed for cough.  Dispense: 20 capsule; Refill: 0  3. Familial hypercholesterolemia Chronic.  Controlled.  Stable.  We will check her lipid panel to see level of control. - Lipid panel

## 2021-01-29 NOTE — Progress Notes (Deleted)
Established Patient Office Visit  Subjective:  Patient ID: Theresa Best, female    DOB: [birth year]  Age: 60 y.o. MRN: [id number]  CC: No chief complaint on file.   HPI Theresa Best presents for Hypertension.  Past Medical History:  Diagnosis Date   Hypertension     No past surgical history on file.  Family History  Problem Relation Age of Onset   Breast cancer Neg Hx     Social History   Socioeconomic History   Marital status: Unknown    Spouse name: Not on file   Number of children: Not on file   Years of education: Not on file   Highest education level: Not on file  Occupational History   Not on file  Tobacco Use   Smoking status: Never   Smokeless tobacco: Never  Substance and Sexual Activity   Alcohol use: No    Alcohol/week: 0.0 standard drinks   Drug use: No   Sexual activity: Not on file  Other Topics Concern   Not on file  Social History Narrative   Not on file   Social Determinants of Health   Financial Resource Strain: Not on file  Food Insecurity: Not on file  Transportation Needs: Not on file  Physical Activity: Not on file  Stress: Not on file  Social Connections: Not on file  Intimate Partner Violence: Not on file    Outpatient Medications Prior to Visit  Medication Sig Dispense Refill   albuterol (PROVENTIL) (2.5 MG/3ML) 0.083% nebulizer solution Take 3 mLs (2.5 mg total) by nebulization every 6 (six) hours as needed for wheezing or shortness of breath. 150 mL 1   albuterol (VENTOLIN HFA) 108 (90 Base) MCG/ACT inhaler Inhale 2 puffs into the lungs every 6 (six) hours as needed for wheezing or shortness of breath. 6.7 g 11   amLODipine (NORVASC) 5 MG tablet Take 1 tablet by mouth once daily 90 tablet 0   amoxicillin-clavulanate (AUGMENTIN) 875-125 MG tablet Take 1 tablet by mouth 2 (two) times daily. 20 tablet 0   budesonide-formoterol (SYMBICORT) 80-4.5 MCG/ACT inhaler Inhale 2 puffs into the lungs 2 (two) times daily. 1 each 1    guaiFENesin-codeine (ROBITUSSIN AC) 100-10 MG/5ML syrup Take 5 mLs by mouth 4 (four) times daily as needed for cough. 118 mL 0   hydrochlorothiazide (HYDRODIURIL) 12.5 MG tablet Take 1 tablet by mouth once daily 90 tablet 0   montelukast (SINGULAIR) 10 MG tablet Take 1 tablet (10 mg total) by mouth at bedtime. 30 tablet 3   sulfacetamide (BLEPH-10) 10 % ophthalmic solution Place 1 drop into the right eye every 3 (three) hours. 15 mL 0   No facility-administered medications prior to visit.    No Known Allergies  ROS Review of Systems  Constitutional:  Negative for chills and fever.  HENT:  Negative for drooling, ear discharge, ear pain and sore throat.   Respiratory:  Negative for cough, shortness of breath and wheezing.   Cardiovascular:  Negative for chest pain, palpitations and leg swelling.  Gastrointestinal:  Negative for abdominal pain, blood in stool, constipation, diarrhea and nausea.  Endocrine: Negative for polydipsia.  Genitourinary:  Negative for dysuria, frequency, hematuria and urgency.  Musculoskeletal:  Negative for back pain, myalgias and neck pain.  Skin:  Negative for rash.  Allergic/Immunologic: Negative for environmental allergies.  Neurological:  Negative for dizziness and headaches.  Hematological:  Does not bruise/bleed easily.  Psychiatric/Behavioral:  Negative for suicidal ideas. The patient is not nervous/anxious.  Objective:    Physical Exam Vitals and nursing note reviewed.  Constitutional:      Appearance: She is well-developed.  HENT:     Head: Normocephalic.     Right Ear: Tympanic membrane, ear canal and external ear normal. There is no impacted cerumen.     Left Ear: Tympanic membrane, ear canal and external ear normal. There is no impacted cerumen.     Nose: Nose normal.  Eyes:     Conjunctiva/sclera: Conjunctivae normal.     Pupils: Pupils are equal, round, and reactive to light.  Cardiovascular:     Rate and Rhythm: Normal rate and  regular rhythm.     Heart sounds: Normal heart sounds.  Pulmonary:     Effort: Pulmonary effort is normal.     Breath sounds: Normal breath sounds.  Abdominal:     General: Bowel sounds are normal.     Palpations: Abdomen is soft.  Genitourinary:    Vagina: Normal.  Musculoskeletal:        General: Normal range of motion.     Cervical back: Normal range of motion and neck supple.  Skin:    General: Skin is warm and dry.  Neurological:     Mental Status: She is alert and oriented to person, place, and time.     Deep Tendon Reflexes: Reflexes are normal and symmetric.  Psychiatric:        Behavior: Behavior normal.        Thought Content: Thought content normal.        Judgment: Judgment normal.    There were no vitals taken for this visit. Wt Readings from Last 3 Encounters:  01/08/21 279 lb (126.6 kg)  10/27/20 273 lb (123.8 kg)  10/13/20 279 lb (126.6 kg)     Health Maintenance Due  Topic Date Due   Zoster Vaccines- Shingrix (2 of 2) 05/18/2018   COVID-19 Vaccine (3 - Booster for Moderna series) 01/19/2020   INFLUENZA VACCINE  01/18/2021    There are no preventive care reminders to display for this patient.  No results found for: TSH No results found for: WBC, HGB, HCT, MCV, PLT Lab Results  Component Value Date   NA 139 05/08/2020   K 4.2 05/08/2020   CO2 25 05/08/2020   GLUCOSE 101 (H) 05/08/2020   BUN 16 05/08/2020   CREATININE 0.67 05/08/2020   ALBUMIN 4.3 05/08/2020   CALCIUM 9.4 05/08/2020   Lab Results  Component Value Date   CHOL 279 (H) 07/22/2019   Lab Results  Component Value Date   HDL 50 07/22/2019   Lab Results  Component Value Date   LDLCALC 205 (H) 07/22/2019   Lab Results  Component Value Date   TRIG 132 07/22/2019   Lab Results  Component Value Date   CHOLHDL 5.3 (H) 07/09/2018   No results found for: HGBA1C    Assessment & Plan:   Problem List Items Addressed This Visit   None   No orders of the defined types were  placed in this encounter.   Follow-up: No follow-ups on file.    Eulis Canner Person, CMA

## 2021-01-30 LAB — RENAL FUNCTION PANEL
Albumin: 4.3 g/dL (ref 3.8–4.9)
BUN/Creatinine Ratio: 25 — ABNORMAL HIGH (ref 9–23)
BUN: 16 mg/dL (ref 6–24)
CO2: 20 mmol/L (ref 20–29)
Calcium: 9.4 mg/dL (ref 8.7–10.2)
Chloride: 104 mmol/L (ref 96–106)
Creatinine, Ser: 0.64 mg/dL (ref 0.57–1.00)
Glucose: 101 mg/dL — ABNORMAL HIGH (ref 65–99)
Phosphorus: 3.6 mg/dL (ref 3.0–4.3)
Potassium: 4.5 mmol/L (ref 3.5–5.2)
Sodium: 141 mmol/L (ref 134–144)
eGFR: 102 mL/min/{1.73_m2} (ref >59)

## 2021-01-30 LAB — LIPID PANEL
Chol/HDL Ratio: 5.8 ratio — ABNORMAL HIGH (ref 0.0–4.4)
Cholesterol, Total: 286 mg/dL — ABNORMAL HIGH (ref 100–199)
HDL: 49 mg/dL (ref >39)
LDL Chol Calc (NIH): 215 mg/dL — ABNORMAL HIGH (ref 0–99)
Triglycerides: 124 mg/dL (ref 0–149)
VLDL Cholesterol Cal: 22 mg/dL (ref 5–40)

## 2021-02-01 ENCOUNTER — Other Ambulatory Visit: Payer: Self-pay

## 2021-02-01 DIAGNOSIS — E782 Mixed hyperlipidemia: Secondary | ICD-10-CM

## 2021-02-01 MED ORDER — ATORVASTATIN CALCIUM 10 MG PO TABS: 10.0000 mg | ORAL_TABLET | Freq: Every day | ORAL | 1 refills | Status: DC

## 2021-02-01 NOTE — Progress Notes (Signed)
Sent in At[facility] to [facility]

## 2021-03-01 ENCOUNTER — Telehealth: Payer: Self-pay

## 2021-03-01 NOTE — Telephone Encounter (Signed)
Copied from CRM [id number]. Topic: General - Inquiry >> Mar 01, 2021  4:10 PM Aretta Nip wrote: Reason for CRM: pt wants a fu call back from [place]. Will not leave any" in regards to".

## 2021-04-21 ENCOUNTER — Telehealth: Payer: Self-pay

## 2021-04-21 NOTE — Telephone Encounter (Signed)
Copied from CRM [id number]. Topic: General - Other >> Apr 21, 2021  1:31 PM Gaetana Michaelis A wrote: Reason for CRM: The patient would like to speak with Delice Bison about their right leg   The patient declined to elaborate or speak with nurse triage  Please contact when available

## 2021-04-22 ENCOUNTER — Telehealth: Payer: Self-pay

## 2021-04-22 NOTE — Telephone Encounter (Signed)
Called pt in response to her question about knee. She feels her knee "popped out" yesterday and was swollen. It does not hurt today and is not swollen anymore. I explained to her if this happens again to call office and get an appt with Jones and then follow up with Ashley Royalty.

## 2021-05-31 ENCOUNTER — Ambulatory Visit: Payer: Self-pay | Admitting: *Deleted

## 2021-05-31 NOTE — Telephone Encounter (Signed)
The patient has experienced a cough and congestion for roughly three days   The patient would like to be prescribed something to help with their discomfort   Please contact further when available    Attempted to reach pt, VM not set up. Unable to leave message to call back.

## 2021-06-01 ENCOUNTER — Telehealth: Payer: Self-pay | Admitting: Family Medicine

## 2021-06-01 ENCOUNTER — Encounter: Payer: Self-pay | Admitting: Family Medicine

## 2021-06-01 ENCOUNTER — Ambulatory Visit: Payer: BC Managed Care – PPO | Admitting: Family Medicine

## 2021-06-01 ENCOUNTER — Other Ambulatory Visit: Payer: Self-pay

## 2021-06-01 VITALS — BP 122/88 | HR 100 | Ht 67.0 in | Wt 281.0 lb

## 2021-06-01 DIAGNOSIS — J0100 Acute maxillary sinusitis, unspecified: Secondary | ICD-10-CM | POA: Diagnosis not present

## 2021-06-01 DIAGNOSIS — R051 Acute cough: Secondary | ICD-10-CM | POA: Diagnosis not present

## 2021-06-01 LAB — POC COVID19 BINAXNOW: SARS Coronavirus 2 Ag: NEGATIVE

## 2021-06-01 LAB — POCT INFLUENZA A/B
Influenza A, POC: NEGATIVE
Influenza B, POC: NEGATIVE

## 2021-06-01 MED ORDER — GUAIFENESIN-CODEINE 100-10 MG/5ML PO SYRP
5.0000 mL | ORAL_SOLUTION | Freq: Three times a day (TID) | ORAL | 0 refills | Status: DC | PRN
Start: 2021-06-01 — End: 2021-07-22

## 2021-06-01 MED ORDER — AMOXICILLIN-POT CLAVULANATE 875-125 MG PO TABS: 1.0000 | ORAL_TABLET | Freq: Two times a day (BID) | ORAL | 0 refills | Status: DC

## 2021-06-01 MED ORDER — PREDNISONE 10 MG PO TABS: 10.0000 mg | ORAL_TABLET | Freq: Every day | ORAL | 0 refills | Status: DC

## 2021-06-01 NOTE — Telephone Encounter (Signed)
Noted   Pt has an appt today 06/01/21.  KP

## 2021-06-01 NOTE — Progress Notes (Signed)
° ° °  Date:  06/01/2021   Name:  Theresa Best   DOB:  28-Apr-1961   MRN:  [id number]   Chief Complaint: No chief complaint on file.  HPI  Lab Results  Component Value Date   NA 141 01/29/2021   K 4.5 01/29/2021   CO2 20 01/29/2021   GLUCOSE 101 (H) 01/29/2021   BUN 16 01/29/2021   CREATININE 0.64 01/29/2021   CALCIUM 9.4 01/29/2021   EGFR 102 01/29/2021   GFRNONAA 97 05/08/2020   Lab Results  Component Value Date   CHOL 286 (H) 01/29/2021   HDL 49 01/29/2021   LDLCALC 215 (H) 01/29/2021   TRIG 124 01/29/2021   CHOLHDL 5.8 (H) 01/29/2021   No results found for: TSH No results found for: HGBA1C No results found for: WBC, HGB, HCT, MCV, PLT No results found for: ALT, AST, GGT, ALKPHOS, BILITOT No results found for: 25OHVITD2, 25OHVITD3, VD25OH   Review of Systems  Patient Active Problem List   Diagnosis Date Noted   Varicose veins of right lower extremity with pain 07/22/2019   Familial hypercholesterolemia 07/22/2019   Uterine prolapse 08/16/2018   SUI (stress urinary incontinence, female) 08/16/2018   POP-Q stage 2 cystocele 08/16/2018   Essential hypertension 11/03/2016   Class 3 severe obesity due to excess calories with serious comorbidity and body mass index (BMI) of 40.0 to 44.9 in adult ([place]) 11/03/2016    No Known Allergies  No past surgical history on file.  Social History   Tobacco Use   Smoking status: Never   Smokeless tobacco: Never  Substance Use Topics   Alcohol use: No    Alcohol/week: 0.0 standard drinks   Drug use: No     Medication list has been reviewed and updated.  No outpatient medications have been marked as taking for the 06/01/21 encounter (Appointment) with Juline Patch, MD.    [facility] 2/9 Scores 01/29/2021 10/13/2020 05/08/2020 07/22/2019  PHQ - 2 Score 0 0 0 0  PHQ- 9 Score 0 0 0 0    GAD 7 : Generalized Anxiety Score 01/29/2021 10/13/2020 05/08/2020 07/22/2019  Nervous, Anxious, on Edge 0 0 0 0  Control/stop worrying 0 0 0  0  Worry too much - different things 0 0 0 0  Trouble relaxing 0 0 0 0  Restless 0 0 0 0  Easily annoyed or irritable 0 0 0 0  Afraid - awful might happen 0 0 0 0  Total GAD 7 Score 0 0 0 0    BP Readings from Last 3 Encounters:  01/29/21 130/80  01/08/21 130/80  10/27/20 (!) 150/82    Physical Exam  Wt Readings from Last 3 Encounters:  01/29/21 277 lb (125.6 kg)  01/08/21 279 lb (126.6 kg)  10/27/20 273 lb (123.8 kg)    There were no vitals taken for this visit.  Assessment and Plan:

## 2021-06-01 NOTE — Telephone Encounter (Signed)
Patiaent insisting on message be sent about medication just requesed today, told her of 48-72 hr turn arond

## 2021-06-01 NOTE — Progress Notes (Signed)
Date:  06/01/2021   Name:  Theresa Best   DOB:  09/26/1960   MRN:  [id number]   Chief Complaint: Cough  Cough This is a new problem. The current episode started in the past 7 days (3rd day). The problem has been gradually worsening. The problem occurs every few minutes. The cough is Productive of purulent sputum (green). Associated symptoms include ear congestion, ear pain, eye redness, headaches, myalgias, postnasal drip, a sore throat and shortness of breath. Pertinent negatives include no chest pain, chills, fever, heartburn, hemoptysis, nasal congestion, rash, rhinorrhea, sweats, weight loss or wheezing. The symptoms are aggravated by exercise (walk around). She has tried OTC cough suppressant (mucinex dm) for the symptoms. The treatment provided no relief.   Lab Results  Component Value Date   NA 141 01/29/2021   K 4.5 01/29/2021   CO2 20 01/29/2021   GLUCOSE 101 (H) 01/29/2021   BUN 16 01/29/2021   CREATININE 0.64 01/29/2021   CALCIUM 9.4 01/29/2021   EGFR 102 01/29/2021   GFRNONAA 97 05/08/2020   Lab Results  Component Value Date   CHOL 286 (H) 01/29/2021   HDL 49 01/29/2021   LDLCALC 215 (H) 01/29/2021   TRIG 124 01/29/2021   CHOLHDL 5.8 (H) 01/29/2021   No results found for: TSH No results found for: HGBA1C No results found for: WBC, HGB, HCT, MCV, PLT No results found for: ALT, AST, GGT, ALKPHOS, BILITOT No results found for: 25OHVITD2, 25OHVITD3, VD25OH   Review of Systems  Constitutional:  Negative for chills, fever and weight loss.  HENT:  Positive for ear pain, postnasal drip and sore throat. Negative for mouth sores and rhinorrhea.   Eyes:  Positive for redness and itching. Negative for pain.  Respiratory:  Positive for cough and shortness of breath. Negative for hemoptysis and wheezing.   Cardiovascular:  Negative for chest pain.  Gastrointestinal:  Negative for heartburn.  Musculoskeletal:  Positive for myalgias.  Skin:  Negative for rash.   Neurological:  Positive for headaches.   Patient Active Problem List   Diagnosis Date Noted   Varicose veins of right lower extremity with pain 07/22/2019   Familial hypercholesterolemia 07/22/2019   Uterine prolapse 08/16/2018   SUI (stress urinary incontinence, female) 08/16/2018   POP-Q stage 2 cystocele 08/16/2018   Essential hypertension 11/03/2016   Class 3 severe obesity due to excess calories with serious comorbidity and body mass index (BMI) of 40.0 to 44.9 in adult ([place]) 11/03/2016    No Known Allergies  History reviewed. No pertinent surgical history.  Social History   Tobacco Use   Smoking status: Never   Smokeless tobacco: Never  Substance Use Topics   Alcohol use: No    Alcohol/week: 0.0 standard drinks   Drug use: No     Medication list has been reviewed and updated.  Current Meds  Medication Sig   albuterol (PROVENTIL) (2.5 MG/3ML) 0.083% nebulizer solution Take 3 mLs (2.5 mg total) by nebulization every 6 (six) hours as needed for wheezing or shortness of breath.   albuterol (VENTOLIN HFA) 108 (90 Base) MCG/ACT inhaler Inhale 2 puffs into the lungs every 6 (six) hours as needed for wheezing or shortness of breath.   amLODipine (NORVASC) 5 MG tablet Take 1 tablet (5 mg total) by mouth daily.   atorvastatin (LIPITOR) 10 MG tablet Take 1 tablet (10 mg total) by mouth daily.   budesonide-formoterol (SYMBICORT) 80-4.5 MCG/ACT inhaler Inhale 2 puffs into the lungs 2 (two) times  daily.   hydrochlorothiazide (HYDRODIURIL) 12.5 MG tablet Take 1 tablet (12.5 mg total) by mouth daily.   montelukast (SINGULAIR) 10 MG tablet Take 1 tablet (10 mg total) by mouth at bedtime.    PHQ 2/9 Scores 01/29/2021 10/13/2020 05/08/2020 07/22/2019  PHQ - 2 Score 0 0 0 0  PHQ- 9 Score 0 0 0 0    GAD 7 : Generalized Anxiety Score 01/29/2021 10/13/2020 05/08/2020 07/22/2019  Nervous, Anxious, on Edge 0 0 0 0  Control/stop worrying 0 0 0 0  Worry too much - different things 0 0 0 0   Trouble relaxing 0 0 0 0  Restless 0 0 0 0  Easily annoyed or irritable 0 0 0 0  Afraid - awful might happen 0 0 0 0  Total GAD 7 Score 0 0 0 0    BP Readings from Last 3 Encounters:  06/01/21 122/88  01/29/21 130/80  01/08/21 130/80    Physical Exam Vitals and nursing note reviewed.  Constitutional:      General: She is not in acute distress.    Appearance: She is not diaphoretic.  HENT:     Head: Normocephalic and atraumatic.     Right Ear: External ear normal.     Left Ear: External ear normal.     Nose: Nose normal.  Eyes:     General:        Right eye: No discharge.        Left eye: No discharge.     Conjunctiva/sclera: Conjunctivae normal.     Pupils: Pupils are equal, round, and reactive to light.  Neck:     Thyroid: No thyromegaly.     Vascular: No JVD.  Cardiovascular:     Rate and Rhythm: Normal rate and regular rhythm.     Heart sounds: Normal heart sounds. No murmur heard.   No friction rub. No gallop.  Pulmonary:     Effort: Pulmonary effort is normal.     Breath sounds: Normal breath sounds.  Abdominal:     General: Bowel sounds are normal.     Palpations: Abdomen is soft. There is no mass.     Tenderness: There is no abdominal tenderness. There is no guarding.  Musculoskeletal:        General: Normal range of motion.     Cervical back: Normal range of motion and neck supple.  Lymphadenopathy:     Cervical: No cervical adenopathy.  Skin:    General: Skin is warm and dry.  Neurological:     Mental Status: She is alert.     Deep Tendon Reflexes: Reflexes are normal and symmetric.    Wt Readings from Last 3 Encounters:  06/01/21 281 lb (127.5 kg)  01/29/21 277 lb (125.6 kg)  01/08/21 279 lb (126.6 kg)    BP 122/88    Pulse 100    Ht 5' 7"  (1.702 m)    Wt 281 lb (127.5 kg)    SpO2 94%    BMI 44.01 kg/m   Assessment and Plan:

## 2021-06-28 ENCOUNTER — Other Ambulatory Visit: Payer: Self-pay | Admitting: Family Medicine

## 2021-06-28 DIAGNOSIS — R051 Acute cough: Secondary | ICD-10-CM

## 2021-06-28 DIAGNOSIS — J0100 Acute maxillary sinusitis, unspecified: Secondary | ICD-10-CM

## 2021-07-07 ENCOUNTER — Encounter: Payer: BC Managed Care – PPO | Admitting: Family Medicine

## 2021-07-22 ENCOUNTER — Encounter: Payer: Self-pay | Admitting: Family Medicine

## 2021-07-22 ENCOUNTER — Ambulatory Visit: Payer: BC Managed Care – PPO | Admitting: Family Medicine

## 2021-07-22 ENCOUNTER — Other Ambulatory Visit: Payer: Self-pay

## 2021-07-22 VITALS — BP 130/80 | HR 88 | Temp 98.9°F | Ht 67.0 in | Wt 282.0 lb

## 2021-07-22 DIAGNOSIS — R051 Acute cough: Secondary | ICD-10-CM | POA: Diagnosis not present

## 2021-07-22 DIAGNOSIS — U071 COVID-19: Secondary | ICD-10-CM

## 2021-07-22 LAB — POC COVID19 BINAXNOW: SARS Coronavirus 2 Ag: POSITIVE — AB

## 2021-07-22 MED ORDER — MOLNUPIRAVIR EUA 200MG CAPSULE: 4.0000 | ORAL_CAPSULE | Freq: Two times a day (BID) | ORAL | 0 refills | Status: AC

## 2021-07-22 MED ORDER — GUAIFENESIN-CODEINE 100-10 MG/5ML PO SYRP
5.0000 mL | ORAL_SOLUTION | Freq: Three times a day (TID) | ORAL | 0 refills | Status: DC | PRN
Start: 2021-07-22 — End: 2021-08-26

## 2021-07-22 NOTE — Patient Instructions (Signed)
COVID-19: What to Do if You Are Sick If you test positive and are an older adult or someone who is at high risk of getting very sick from COVID-19, treatment may be available. Contact a healthcare provider right away after a positive test to determine if you are eligible, even if your symptoms are mild right now. You can also visit a [facility] location and, if eligible, receive a prescription from a provider. Don't delay: Treatment must be started within the first few days to be effective. If you have a fever, cough, or other symptoms, you might have COVID-19. Most people have mild illness and are able to recover at home. If you are sick: Keep track of your symptoms. If you have an emergency warning sign (including trouble breathing), call 911. Steps to help prevent the spread of COVID-19 if you are sick If you are sick with COVID-19 or think you might have COVID-19, follow the steps below to care for yourself and to help protect other people in your home and community. Stay home except to get medical care Stay home. Most people with COVID-19 have mild illness and can recover at home without medical care. Do not leave your home, except to get medical care. Do not visit public areas and do not go to places where you are unable to wear a mask. Take care of yourself. Get rest and stay hydrated. Take over-the-counter medicines, such as acetaminophen, to help you feel better. Stay in touch with your doctor. Call before you get medical care. Be sure to get care if you have trouble breathing, or have any other emergency warning signs, or if you think it is an emergency. Avoid public transportation, ride-sharing, or taxis if possible. Get tested If you have symptoms of COVID-19, get tested. While waiting for test results, stay away from others, including staying apart from those living in your household. Get tested as soon as possible after your symptoms start. Treatments may be available for people with  COVID-19 who are at risk for becoming very sick. Don't delay: Treatment must be started early to be effective--some treatments must begin within 5 days of your first symptoms. Contact your healthcare provider right away if your test result is positive to determine if you are eligible. Self-tests are one of several options for testing for the virus that causes COVID-19 and may be more convenient than laboratory-based tests and point-of-care tests. Ask your healthcare provider or your local health department if you need help interpreting your test results. You can visit your state, tribal, local, and territorial health department's website to look for the latest local information on testing sites. Separate yourself from other people As much as possible, stay in a specific room and away from other people and pets in your home. If possible, you should use a separate bathroom. If you need to be around other people or animals in or outside of the home, wear a well-fitting mask. Tell your close contacts that they may have been exposed to COVID-19. An infected person can spread COVID-19 starting 48 hours (or 2 days) before the person has any symptoms or tests positive. By letting your close contacts know they may have been exposed to COVID-19, you are helping to protect everyone. See COVID-19 and Animals if you have questions about pets. If you are diagnosed with COVID-19, someone from the health department may call you. Answer the call to slow the spread. Monitor your symptoms Symptoms of COVID-19 include fever, cough, or other  symptoms. Follow care instructions from your healthcare provider and local health department. Your local health authorities may give instructions on checking your symptoms and reporting information. When to seek emergency medical attention Look for emergency warning signs* for COVID-19. If someone is showing any of these signs, seek emergency medical care immediately: Trouble  breathing Persistent pain or pressure in the chest New confusion Inability to wake or stay awake Pale, gray, or blue-colored skin, lips, or nail beds, depending on skin tone *This list is not all possible symptoms. Please call your medical provider for any other symptoms that are severe or concerning to you. Call 911 or call ahead to your local emergency facility: Notify the operator that you are seeking care for someone who has or may have COVID-19. Call ahead before visiting your doctor Call ahead. Many medical visits for routine care are being postponed or done by phone or telemedicine. If you have a medical appointment that cannot be postponed, call your doctor's office, and tell them you have or may have COVID-19. This will help the office protect themselves and other patients. If you are sick, wear a well-fitting mask You should wear a mask if you must be around other people or animals, including pets (even at home). Wear a mask with the best fit, protection, and comfort for you. You don't need to wear the mask if you are alone. If you can't put on a mask (because of trouble breathing, for example), cover your coughs and sneezes in some other way. Try to stay at least 6 feet away from other people. This will help protect the people around you. Masks should not be placed on young children under age 38 years, anyone who has trouble breathing, or anyone who is not able to remove the mask without help. Cover your coughs and sneezes Cover your mouth and nose with a tissue when you cough or sneeze. Throw away used tissues in a lined trash can. Immediately wash your hands with soap and water for at least 20 seconds. If soap and water are not available, clean your hands with an alcohol-based hand sanitizer that contains at least 60% alcohol. Clean your hands often Wash your hands often with soap and water for at least 20 seconds. This is especially important after blowing your nose, coughing, or  sneezing; going to the bathroom; and before eating or preparing food. Use hand sanitizer if soap and water are not available. Use an alcohol-based hand sanitizer with at least 60% alcohol, covering all surfaces of your hands and rubbing them together until they feel dry. Soap and water are the best option, especially if hands are visibly dirty. Avoid touching your eyes, nose, and mouth with unwashed hands. Handwashing Tips Avoid sharing personal household items Do not share dishes, drinking glasses, cups, eating utensils, towels, or bedding with other people in your home. Wash these items thoroughly after using them with soap and water or put in the dishwasher. Clean surfaces in your home regularly Clean and disinfect high-touch surfaces (for example, doorknobs, tables, handles, light switches, and countertops) in your "sick room" and bathroom. In shared spaces, you should clean and disinfect surfaces and items after each use by the person who is ill. If you are sick and cannot clean, a caregiver or other person should only clean and disinfect the area around you (such as your bedroom and bathroom) on an as needed basis. Your caregiver/other person should wait as long as possible (at least several hours) and wear a  mask before entering, cleaning, and disinfecting shared spaces that you use. Clean and disinfect areas that may have blood, stool, or body fluids on them. Use household cleaners and disinfectants. Clean visible dirty surfaces with household cleaners containing soap or detergent. Then, use a household disinfectant. Use a product from [employer]: Disinfectants for Coronavirus ([id number]). Be sure to follow the instructions on the label to ensure safe and effective use of the product. Many products recommend keeping the surface wet with a disinfectant for a certain period of time (look at "contact time" on the product label). You may also need to wear personal protective equipment, such as  gloves, depending on the directions on the product label. Immediately after disinfecting, wash your hands with soap and water for 20 seconds. For completed guidance on cleaning and disinfecting your home, visit Complete Disinfection Guidance. Take steps to improve ventilation at home Improve ventilation (air flow) at home to help prevent from spreading COVID-19 to other people in your household. Clear out COVID-19 virus particles in the air by opening windows, using air filters, and turning on fans in your home. Use this interactive tool to learn how to improve air flow in your home. When you can be around others after being sick with COVID-19 Deciding when you can be around others is different for different situations. Find out when you can safely end home isolation. For any additional questions about your care, contact your healthcare provider or state or local health department. 09/08/2020 Content source: [facility] ([facility]), Division of Viral Diseases This information is not intended to replace advice given to you by your health care provider. Make sure you discuss any questions you have with your health care provider. Document Revised: 02/26/2021 Document Reviewed: 02/26/2021 Elsevier Patient Education  2022 [place]: [employer] and Audiological scientist If you were exposed Quarantine and stay away from others when you have been in close contact with someone who has COVID-19. Isolate If you are sick or test positive Isolate when you are sick or when you have COVID-19, even if you don't have symptoms. When to stay home Calculating quarantine The date of your exposure is considered day 0. Day 1 is the first full day after your last contact with a person who has had COVID-19. Stay home and away from other people for at least 5 days. Learn why CDC updated guidance for the general public. IF YOU were exposed to COVID-19 and are NOT   up to dateIF YOU were exposed to COVID-19 and are NOT on COVID-19 vaccinations Quarantine for at least 5 days Stay home Stay home and quarantine for at least 5 full days. Wear a well-fitting mask if you must be around others in your home. Do not travel. Get tested Even if you don't develop symptoms, get tested at least 5 days after you last had close contact with someone with COVID-19. After quarantine Watch for symptoms Watch for symptoms until 10 days after you last had close contact with someone with COVID-19. Avoid travel It is best to avoid travel until a full 10 days after you last had close contact with someone with COVID-19. If you develop symptoms Isolate immediately and get tested. Continue to stay home until you know the results. Wear a well-fitting mask around others. Take precautions until day 10 Wear a well-fitting mask Wear a well-fitting mask for 10 full days any time you are around others inside your home or in public. Do not go  to places where you are unable to wear a well-fitting mask. If you must travel during days 6-10, take precautions. Avoid being around people who are more likely to get very sick from COVID-19. IF YOU were exposed to COVID-19 and are  up to dateIF YOU were exposed to COVID-19 and are on COVID-19 vaccinations No quarantine You do not need to stay home unless you develop symptoms. Get tested Even if you don't develop symptoms, get tested at least 5 days after you last had close contact with someone with COVID-19. Watch for symptoms Watch for symptoms until 10 days after you last had close contact with someone with COVID-19. If you develop symptoms Isolate immediately and get tested. Continue to stay home until you know the results. Wear a well-fitting mask around others. Take precautions until day 10 Wear a well-fitting mask Wear a well-fitting mask for 10 full days any time you are around others inside your home or in public. Do not go to  places where you are unable to wear a well-fitting mask. Take precautions if traveling Avoid being around people who are more likely to get very sick from COVID-19. IF YOU were exposed to COVID-19 and had confirmed COVID-19 within the past 90 days (you tested positive using a viral test) No quarantine You do not need to stay home unless you develop symptoms. Watch for symptoms Watch for symptoms until 10 days after you last had close contact with someone with COVID-19. If you develop symptoms Isolate immediately and get tested. Continue to stay home until you know the results. Wear a well-fitting mask around others. Take precautions until day 10 Wear a well-fitting mask Wear a well-fitting mask for 10 full days any time you are around others inside your home or in public. Do not go to places where you are unable to wear a well-fitting mask. Take precautions if traveling Avoid being around people who are more likely to get very sick from COVID-19. Calculating isolation Day 0 is your first day of symptoms or a positive viral test. Day 1 is the first full day after your symptoms developed or your test specimen was collected. If you have COVID-19 or have symptoms, isolate for at least 5 days. IF YOU tested positive for COVID-19 or have symptoms, regardless of vaccination status Stay home for at least 5 days Stay home for 5 days and isolate from others in your home. Wear a well-fitting mask if you must be around others in your home. Do not travel. Ending isolation if you had symptoms End isolation after 5 full days if you are fever-free for 24 hours (without the use of fever-reducing medication) and your symptoms are improving. Ending isolation if you did NOT have symptoms End isolation after at least 5 full days after your positive test. If you got very sick from COVID-19 or have a weakened immune system You should isolate for at least 10 days. Consult your doctor before ending  isolation. Take precautions until day 10 Wear a well-fitting mask Wear a well-fitting mask for 10 full days any time you are around others inside your home or in public. Do not go to places where you are unable to wear a well-fitting mask. Do not travel Do not travel until a full 10 days after your symptoms started or the date your positive test was taken if you had no symptoms. Avoid being around people who are more likely to get very sick from COVID-19. Definitions Exposure Contact with someone infected with  SARS-CoV-2, the virus that causes COVID-19, in a way that increases the likelihood of getting infected with the virus. Close contact A close contact is someone who was less than 6 feet away from an infected person (laboratory-confirmed or a clinical diagnosis) for a cumulative total of 15 minutes or more over a 24-hour period. For example, three individual 5-minute exposures for a total of 15 minutes. People who are exposed to someone with COVID-19 after they completed at least 5 days of isolation are not considered close contacts. Julio Sicks is a strategy used to prevent transmission of COVID-19 by keeping people who have been in close contact with someone with COVID-19 apart from others. Who does not need to quarantine? If you had close contact with someone with COVID-19 and you are in one of the following groups, you do not need to quarantine. You are up to date with your COVID-19 vaccines. You had confirmed COVID-19 within the last 90 days (meaning you tested positive using a viral test). If you are up to date with COVID-19 vaccines, you should wear a well-fitting mask around others for 10 days from the date of your last close contact with someone with COVID-19 (the date of last close contact is considered day 0). Get tested at least 5 days after you last had close contact with someone with COVID-19. If you test positive or develop COVID-19 symptoms, isolate from other people  and follow recommendations in the Isolation section below. If you tested positive for COVID-19 with a viral test within the previous 90 days and subsequently recovered and remain without COVID-19 symptoms, you do not need to quarantine or get tested after close contact. You should wear a well-fitting mask around others for 10 days from the date of your last close contact with someone with COVID-19 (the date of last close contact is considered day 0). If you have COVID-19 symptoms, get tested and isolate from other people and follow recommendations in the Isolation section below. Who should quarantine? If you come into close contact with someone with COVID-19, you should quarantine if you are not up to date on COVID-19 vaccines. This includes people who are not vaccinated. What to do for quarantine Stay home and away from other people for at least 5 days (day 0 through day 5) after your last contact with a person who has COVID-19. The date of your exposure is considered day 0. Wear a well-fitting mask when around others at home, if possible. For 10 days after your last close contact with someone with COVID-19, watch for fever (100.60F or greater), cough, shortness of breath, or other COVID-19 symptoms. If you develop symptoms, get tested immediately and isolate until you receive your test results. If you test positive, follow isolation recommendations. If you do not develop symptoms, get tested at least 5 days after you last had close contact with someone with COVID-19. If you test negative, you can leave your home, but continue to wear a well-fitting mask when around others at home and in public until 10 days after your last close contact with someone with COVID-19. If you test positive, you should isolate for at least 5 days from the date of your positive test (if you do not have symptoms). If you do develop COVID-19 symptoms, isolate for at least 5 days from the date your symptoms began (the date the  symptoms started is day 0). Follow recommendations in the isolation section below. If you are unable to get a test 5 days after  last close contact with someone with COVID-19, you can leave your home after day 5 if you have been without COVID-19 symptoms throughout the 5-day period. Wear a well-fitting mask for 10 days after your date of last close contact when around others at home and in public. Avoid people who are have weakened immune systems or are more likely to get very sick from COVID-19, and nursing homes and other high-risk settings, until after at least 10 days. If possible, stay away from people you live with, especially people who are at higher risk for getting very sick from COVID-19, as well as others outside your home throughout the full 10 days after your last close contact with someone with COVID-19. If you are unable to quarantine, you should wear a well-fitting mask for 10 days when around others at home and in public. If you are unable to wear a mask when around others, you should continue to quarantine for 10 days. Avoid people who have weakened immune systems or are more likely to get very sick from COVID-19, and nursing homes and other high-risk settings, until after at least 10 days. See additional information about travel. Do not go to places where you are unable to wear a mask, such as restaurants and some gyms, and avoid eating around others at home and at work until after 10 days after your last close contact with someone with COVID-19. After quarantine Watch for symptoms until 10 days after your last close contact with someone with COVID-19. If you have symptoms, isolate immediately and get tested. Quarantine in high-risk congregate settings In certain congregate settings that have high risk of secondary transmission (such as Systems analyst and detention facilities, homeless shelters, or cruise ships), CDC recommends a 10-day quarantine for residents, regardless of vaccination  and booster status. During periods of critical staffing shortages, facilities may consider shortening the quarantine period for staff to ensure continuity of operations. Decisions to shorten quarantine in these settings should be made in consultation with state, local, tribal, or territorial health departments and should take into consideration the context and characteristics of the facility. CDC's setting-specific guidance provides additional recommendations for these settings. Isolation Isolation is used to separate people with confirmed or suspected COVID-19 from those without COVID-19. People who are in isolation should stay home until it's safe for them to be around others. At home, anyone sick or infected should separate from others, or wear a well-fitting mask when they need to be around others. People in isolation should stay in a specific "sick room" or area and use a separate bathroom if available. Everyone who has presumed or confirmed COVID-19 should stay home and isolate from other people for at least 5 full days (day 0 is the first day of symptoms or the date of the day of the positive viral test for asymptomatic persons). They should wear a mask when around others at home and in public for an additional 5 days. People who are confirmed to have COVID-19 or are showing symptoms of COVID-19 need to isolate regardless of their vaccination status. This includes: People who have a positive viral test for COVID-19, regardless of whether or not they have symptoms. People with symptoms of COVID-19, including people who are awaiting test results or have not been tested. People with symptoms should isolate even if they do not know if they have been in close contact with someone with COVID-19. What to do for isolation Monitor your symptoms. If you have an emergency warning sign (including trouble  breathing), seek emergency medical care immediately. Stay in a separate room from other household members, if  possible. Use a separate bathroom, if possible. Take steps to improve ventilation at home, if possible. Avoid contact with other members of the household and pets. Don't share personal household items, like cups, towels, and utensils. Wear a well-fitting mask when you need to be around other people. Learn more about what to do if you are sick and how to notify your contacts. Ending isolation for people who had COVID-19 and had symptoms If you had COVID-19 and had symptoms, isolate for at least 5 days. To calculate your 5-day isolation period, day 0 is your first day of symptoms. Day 1 is the first full day after your symptoms developed. You can leave isolation after 5 full days. You can end isolation after 5 full days if you are fever-free for 24 hours without the use of fever-reducing medication and your other symptoms have improved (Loss of taste and smell may persist for weeks or months after recovery and need not delay the end of isolation). You should continue to wear a well-fitting mask around others at home and in public for 5 additional days (day 6 through day 10) after the end of your 5-day isolation period. If you are unable to wear a mask when around others, you should continue to isolate for a full 10 days. Avoid people who have weakened immune systems or are more likely to get very sick from COVID-19, and nursing homes and other high-risk settings, until after at least 10 days. If you continue to have fever or your other symptoms have not improved after 5 days of isolation, you should wait to end your isolation until you are fever-free for 24 hours without the use of fever-reducing medication and your other symptoms have improved. Continue to wear a well-fitting mask through day 10. Contact your healthcare provider if you have questions. See additional information about travel. Do not go to places where you are unable to wear a mask, such as restaurants and some gyms, and avoid eating  around others at home and at work until a full 10 days after your first day of symptoms. If an individual has access to a test and wants to test, the best approach is to use an antigen test1 towards the end of the 5-day isolation period. Collect the test sample only if you are fever-free for 24 hours without the use of fever-reducing medication and your other symptoms have improved (loss of taste and smell may persist for weeks or months after recovery and need not delay the end of isolation). If your test result is positive, you should continue to isolate until day 10. If your test result is negative, you can end isolation, but continue to wear a well-fitting mask around others at home and in public until day 10. Follow additional recommendations for masking and avoiding travel as described above. 1As noted in the labeling for authorized over-the counter antigen tests: Negative results should be treated as presumptive. Negative results do not rule out SARS-CoV-2 infection and should not be used as the sole basis for treatment or patient management decisions, including infection control decisions. To improve results, antigen tests should be used twice over a three-day period with at least 24 hours and no more than 48 hours between tests. Note that these recommendations on ending isolation do not apply to people who are moderately ill or very sick from COVID-19 or have weakened immune systems. See section  below for recommendations for when to end isolation for these groups. Ending isolation for people who tested positive for COVID-19 but had no symptoms If you test positive for COVID-19 and never develop symptoms, isolate for at least 5 days. Day 0 is the day of your positive viral test (based on the date you were tested) and day 1 is the first full day after the specimen was collected for your positive test. You can leave isolation after 5 full days. If you continue to have no symptoms, you can end isolation  after at least 5 days. You should continue to wear a well-fitting mask around others at home and in public until day 10 (day 6 through day 10). If you are unable to wear a mask when around others, you should continue to isolate for 10 days. Avoid people who have weakened immune systems or are more likely to get very sick from COVID-19, and nursing homes and other high-risk settings, until after at least 10 days. If you develop symptoms after testing positive, your 5-day isolation period should start over. Day 0 is your first day of symptoms. Follow the recommendations above for ending isolation for people who had COVID-19 and had symptoms. See additional information about travel. Do not go to places where you are unable to wear a mask, such as restaurants and some gyms, and avoid eating around others at home and at work until 10 days after the day of your positive test. If an individual has access to a test and wants to test, the best approach is to use an antigen test1 towards the end of the 5-day isolation period. If your test result is positive, you should continue to isolate until day 10. If your test result is positive, you can also choose to test daily and if your test result is negative, you can end isolation, but continue to wear a well-fitting mask around others at home and in public until day 10. Follow additional recommendations for masking and avoiding travel as described above. 1As noted in the labeling for authorized over-the counter antigen tests: Negative results should be treated as presumptive. Negative results do not rule out SARS-CoV-2 infection and should not be used as the sole basis for treatment or patient management decisions, including infection control decisions. To improve results, antigen tests should be used twice over a three-day period with at least 24 hours and no more than 48 hours between tests. Ending isolation for people who were moderately or very sick from COVID-19 or  have a weakened immune system People who are moderately ill from COVID-19 (experiencing symptoms that affect the lungs like shortness of breath or difficulty breathing) should isolate for 10 days and follow all other isolation precautions. To calculate your 10-day isolation period, day 0 is your first day of symptoms. Day 1 is the first full day after your symptoms developed. If you are unsure if your symptoms are moderate, talk to a healthcare provider for further guidance. People who are very sick from COVID-19 (this means people who were hospitalized or required intensive care or ventilation support) and people who have weakened immune systems might need to isolate at home longer. They may also require testing with a viral test to determine when they can be around others. CDC recommends an isolation period of at least 10 and up to 20 days for people who were very sick from COVID-19 and for people with weakened immune systems. Consult with your healthcare provider about when you can resume  being around other people. If you are unsure if your symptoms are severe or if you have a weakened immune system, talk to a healthcare provider for further guidance. People who have a weakened immune system should talk to their healthcare provider about the potential for reduced immune responses to COVID-19 vaccines and the need to continue to follow current prevention measures (including wearing a well-fitting mask and avoiding crowds and poorly ventilated indoor spaces) to protect themselves against COVID-19 until advised otherwise by their healthcare provider. Close contacts of immunocompromised people--including household members--should also be encouraged to receive all recommended COVID-19 vaccine doses to help protect these people. Isolation in high-risk congregate settings In certain high-risk congregate settings that have high risk of secondary transmission and where it is not feasible to cohort people (such as  Systems analyst and detention facilities, homeless shelters, and cruise ships), CDC recommends a 10-day isolation period for residents. During periods of critical staffing shortages, facilities may consider shortening the isolation period for staff to ensure continuity of operations. Decisions to shorten isolation in these settings should be made in consultation with state, local, tribal, or territorial health departments and should take into consideration the context and characteristics of the facility. CDC's setting-specific guidance provides additional recommendations for these settings. This CDC guidance is meant to supplement--not replace--any federal, state, local, territorial, or tribal health and safety laws, rules, and regulations. Recommendations for specific settings These recommendations do not apply to healthcare professionals. For guidance specific to these settings, see Healthcare professionals: Interim Guidance for Optician, dispensing with SARS-CoV-2 Infection or Exposure to SARS-CoV-2 Patients, residents, and visitors to healthcare settings: Interim Infection Prevention and Control Recommendations for Healthcare Personnel During the [place] 2019 (COVID-19) Pandemic Additional setting-specific guidance and recommendations are available. These recommendations on quarantine and isolation do apply to [place] settings. Additional guidance is available here: Overview of COVID-19 Quarantine for K-12 Schools Travelers: Travel information and recommendations Congregate facilities and other settings: [employer] for community, work, and school settings Ongoing COVID-19 exposure FAQs I live with someone with COVID-19, but I cannot be separated from them. How do we manage quarantine in this situation? It is very important for people with COVID-19 to remain apart from other people, if possible, even if they are living together. If separation of the person with COVID-19 from  others that they live with is not possible, the other people that they live with will have ongoing exposure, meaning they will be repeatedly exposed until that person is no longer able to spread the virus to other people. In this situation, there are precautions you can take to limit the spread of COVID-19: The person with COVID-19 and everyone they live with should wear a well-fitting mask inside the home. If possible, one person should care for the person with COVID-19 to limit the number of people who are in close contact with the infected person. Take steps to protect yourself and others to reduce transmission in the home: Quarantine if you are not up to date with your COVID-19 vaccines. Isolate if you are sick or tested positive for COVID-19, even if you don't have symptoms. Learn more about the public health recommendations for testing, mask use and quarantine of close contacts, like yourself, who have ongoing exposure. These recommendations differ depending on your vaccination status. What should I do if I have ongoing exposure to COVID-19 from someone I live with? Recommendations for this situation depend on your vaccination status: If you are not up to date on  COVID-19 vaccines and have ongoing exposure to COVID-19, you should: Begin quarantine immediately and continue to quarantine throughout the isolation period of the person with COVID-19. Continue to quarantine for an additional 5 days starting the day after the end of isolation for the person with COVID-19. Get tested at least 5 days after the end of isolation of the infected person that lives with them. If you test negative, you can leave the home but should continue to wear a well-fitting mask when around others at home and in public until 10 days after the end of isolation for the person with COVID-19. Isolate immediately if you develop symptoms of COVID-19 or test positive. If you are up to date with COVID-19 vaccines and have  ongoing exposure to COVID-19, you should: Get tested at least 5 days after your first exposure. A person with COVID-19 is considered infectious starting 2 days before they develop symptoms, or 2 days before the date of their positive test if they do not have symptoms. Get tested again at least 5 days after the end of isolation for the person with COVID-19. Wear a well-fitting mask when you are around the person with COVID-19, and do this throughout their isolation period. Wear a well-fitting mask around others for 10 days after the infected person's isolation period ends. Isolate immediately if you develop symptoms of COVID-19 or test positive. What should I do if multiple people I live with test positive for COVID-19 at different times? Recommendations for this situation depend on your vaccination status: If you are not up to date with your COVID-19 vaccines, you should: Quarantine throughout the isolation period of any infected person that you live with. Continue to quarantine until 5 days after the end of isolation date for the most recently infected person that lives with you. For example, if the last day of isolation of the person most recently infected with COVID-19 was June 30, the new 5-day quarantine period starts on July 1. Get tested at least 5 days after the end of isolation for the most recently infected person that lives with you. Wear a well-fitting mask when you are around any person with COVID-19 while that person is in isolation. Wear a well-fitting mask when you are around other people until 10 days after your last close contact. Isolate immediately if you develop symptoms of COVID-19 or test positive. If you are up to date with your COVID-19 vaccines, you should: Get tested at least 5 days after your first exposure. A person with COVID-19 is considered infectious starting 2 days before they developed symptoms, or 2 days before the date of their positive test if they do not have  symptoms. Get tested again at least 5 days after the end of isolation for the most recently infected person that lives with you. Wear a well-fitting mask when you are around any person with COVID-19 while that person is in isolation. Wear a well-fitting mask around others for 10 days after the end of isolation for the most recently infected person that lives with you. For example, if the last day of isolation for the person most recently infected with COVID-19 was June 30, the new 10-day period to wear a well-fitting mask indoors in public starts on July 1. Isolate immediately if you develop symptoms of COVID-19 or test positive. I had COVID-19 and completed isolation. Do I have to quarantine or get tested if someone I live with gets COVID-19 shortly after I completed isolation? No. If you recently completed  isolation and someone that lives with you tests positive for the virus that causes COVID-19 shortly after the end of your isolation period, you do not have to quarantine or get tested as long as you do not develop new symptoms. Once all of the people that live together have completed isolation or quarantine, refer to the guidance below for new exposures to COVID-19. If you had COVID-19 in the previous 90 days and then came into close contact with someone with COVID-19, you do not have to quarantine or get tested if you do not have symptoms. But you should: Wear a well-fitting mask indoors in public for 10 days after your last close contact. Monitor for COVID-19 symptoms for 10 days from the date of your last close contact. Isolate immediately and get tested if symptoms develop. If more than 90 days have passed since your recovery from infection, follow CDC's recommendations for close contacts. These recommendations will differ depending on your vaccination status. 09/16/2020 Content source: [facility] ([facility]), Division of Viral Diseases This  information is not intended to replace advice given to you by your health care provider. Make sure you discuss any questions you have with your health care provider. Document Revised: 01/20/2021 Document Reviewed: 01/20/2021 Elsevier Patient Education  [place].

## 2021-07-22 NOTE — Progress Notes (Signed)
Date:  07/22/2021   Name:  Theresa Best   DOB:  [birth year]   MRN:  [id number]   Chief Complaint: Sinusitis (Sore throat, no fever, cough, runny nose)  Sinusitis This is a new problem. The current episode started in the past 7 days. The problem has been waxing and waning since onset. There has been no fever. Associated symptoms include congestion, coughing, ear pain, shortness of breath, sinus pressure, sneezing and a sore throat. Pertinent negatives include no chills, diaphoresis, headaches, hoarse voice, neck pain or swollen glands. Treatments tried: mucinex dm. The treatment provided moderate relief.   Lab Results  Component Value Date   NA 141 01/29/2021   K 4.5 01/29/2021   CO2 20 01/29/2021   GLUCOSE 101 (H) 01/29/2021   BUN 16 01/29/2021   CREATININE 0.64 01/29/2021   CALCIUM 9.4 01/29/2021   EGFR 102 01/29/2021   GFRNONAA 97 05/08/2020   Lab Results  Component Value Date   CHOL 286 (H) 01/29/2021   HDL 49 01/29/2021   LDLCALC 215 (H) 01/29/2021   TRIG 124 01/29/2021   CHOLHDL 5.8 (H) 01/29/2021   No results found for: TSH No results found for: HGBA1C No results found for: WBC, HGB, HCT, MCV, PLT No results found for: ALT, AST, GGT, ALKPHOS, BILITOT No results found for: 25OHVITD2, 25OHVITD3, VD25OH   Review of Systems  Constitutional:  Negative for chills, diaphoresis and fever.  HENT:  Positive for congestion, ear pain, sinus pressure, sneezing and sore throat. Negative for drooling, ear discharge and hoarse voice.   Respiratory:  Positive for cough and shortness of breath. Negative for wheezing.   Cardiovascular:  Negative for chest pain, palpitations and leg swelling.  Gastrointestinal:  Negative for abdominal pain, blood in stool, constipation, diarrhea and nausea.  Endocrine: Negative for polydipsia.  Genitourinary:  Negative for dysuria, frequency, hematuria and urgency.  Musculoskeletal:  Negative for back pain, myalgias and neck pain.  Skin:  Negative  for rash.  Allergic/Immunologic: Negative for environmental allergies.  Neurological:  Negative for dizziness and headaches.  Hematological:  Does not bruise/bleed easily.  Psychiatric/Behavioral:  Negative for suicidal ideas. The patient is not nervous/anxious.    Patient Active Problem List   Diagnosis Date Noted   Varicose veins of right lower extremity with pain 07/22/2019   Familial hypercholesterolemia 07/22/2019   Uterine prolapse 08/16/2018   SUI (stress urinary incontinence, female) 08/16/2018   POP-Q stage 2 cystocele 08/16/2018   Essential hypertension 11/03/2016   Class 3 severe obesity due to excess calories with serious comorbidity and body mass index (BMI) of 40.0 to 44.9 in adult [facility]) 11/03/2016    No Known Allergies  No past surgical history on file.  Social History   Tobacco Use   Smoking status: Never   Smokeless tobacco: Never  Substance Use Topics   Alcohol use: No    Alcohol/week: 0.0 standard drinks   Drug use: No     Medication list has been reviewed and updated.  Current Meds  Medication Sig   albuterol (PROVENTIL) (2.5 MG/3ML) 0.083% nebulizer solution Take 3 mLs (2.5 mg total) by nebulization every 6 (six) hours as needed for wheezing or shortness of breath.   albuterol (VENTOLIN HFA) 108 (90 Base) MCG/ACT inhaler Inhale 2 puffs into the lungs every 6 (six) hours as needed for wheezing or shortness of breath.   amLODipine (NORVASC) 5 MG tablet Take 1 tablet (5 mg total) by mouth daily.   atorvastatin (LIPITOR) 10 MG  tablet Take 1 tablet (10 mg total) by mouth daily.   budesonide-formoterol (SYMBICORT) 80-4.5 MCG/ACT inhaler Inhale 2 puffs into the lungs 2 (two) times daily.   hydrochlorothiazide (HYDRODIURIL) 12.5 MG tablet Take 1 tablet (12.5 mg total) by mouth daily.   montelukast (SINGULAIR) 10 MG tablet Take 1 tablet (10 mg total) by mouth at bedtime.    PHQ 2/9 Scores 01/29/2021 10/13/2020 05/08/2020 07/22/2019  PHQ - 2 Score 0 0 0 0  PHQ-  9 Score 0 0 0 0    GAD 7 : Generalized Anxiety Score 01/29/2021 10/13/2020 05/08/2020 07/22/2019  Nervous, Anxious, on Edge 0 0 0 0  Control/stop worrying 0 0 0 0  Worry too much - different things 0 0 0 0  Trouble relaxing 0 0 0 0  Restless 0 0 0 0  Easily annoyed or irritable 0 0 0 0  Afraid - awful might happen 0 0 0 0  Total GAD 7 Score 0 0 0 0    BP Readings from Last 3 Encounters:  07/22/21 130/80  06/01/21 122/88  01/29/21 130/80    Physical Exam Vitals and nursing note reviewed.  Constitutional:      Appearance: She is well-developed.  HENT:     Head: Normocephalic.     Right Ear: Tympanic membrane, ear canal and external ear normal.     Left Ear: Tympanic membrane, ear canal and external ear normal.     Nose: Nose normal. No congestion or rhinorrhea.  Eyes:     General: Lids are everted, no foreign bodies appreciated. No scleral icterus.       Left eye: No foreign body or hordeolum.     Conjunctiva/sclera: Conjunctivae normal.     Right eye: Right conjunctiva is not injected.     Left eye: Left conjunctiva is not injected.     Pupils: Pupils are equal, round, and reactive to light.  Neck:     Thyroid: No thyromegaly.     Vascular: No JVD.     Trachea: No tracheal deviation.  Cardiovascular:     Rate and Rhythm: Normal rate and regular rhythm.     Heart sounds: Normal heart sounds. No murmur heard.   No friction rub. No gallop.  Pulmonary:     Effort: Pulmonary effort is normal. No respiratory distress.     Breath sounds: Normal breath sounds. No wheezing or rales.  Abdominal:     General: Bowel sounds are normal.     Palpations: Abdomen is soft. There is no mass.     Tenderness: There is no abdominal tenderness. There is no guarding or rebound.  Musculoskeletal:        General: No tenderness. Normal range of motion.     Cervical back: Normal range of motion and neck supple.  Lymphadenopathy:     Cervical: No cervical adenopathy.  Skin:    General: Skin is  warm.     Findings: No rash.  Neurological:     Mental Status: She is alert and oriented to person, place, and time.     Cranial Nerves: No cranial nerve deficit.     Deep Tendon Reflexes: Reflexes normal.  Psychiatric:        Mood and Affect: Mood is not anxious or depressed.    Wt Readings from Last 3 Encounters:  07/22/21 282 lb (127.9 kg)  06/01/21 281 lb (127.5 kg)  01/29/21 277 lb (125.6 kg)    BP 130/80    Pulse 88  Temp 98.9 F (37.2 C) (Oral)    Ht 5' 7" (1.702 m)    Wt 282 lb (127.9 kg)    SpO2 99%    BMI 44.17 kg/m   Assessment and Plan:  1. COVID New onset.  Persistent.  Stable without high fever and shortness of breath.  Physical exam normal with no rales rhonchi wheezes or rub.  COVID test is positive at the office.  We will initiatemolnupavir 800 mg twice a day for 5 days.  Patient is to isolate for 5 days and mask for an additional 5 days thereafter.  2. Acute cough Acute.  Persistent.  Nonproductive.  Pulmonary exam is unremarkable for percussion and auscultation.  We will prescribe Robitussin-AC 1 teaspoon every 6 hours. - POC COVID-19

## 2021-07-23 ENCOUNTER — Telehealth: Payer: Self-pay

## 2021-07-23 NOTE — Telephone Encounter (Signed)
Copied from CRM [id number]. Topic: General - Other >> Jul 23, 2021  8:14 AM Wyonia Hough E wrote: Reason for CRM: pt would like a call from Delice Bison about medication / please advise

## 2021-08-02 ENCOUNTER — Ambulatory Visit: Payer: BC Managed Care – PPO | Admitting: Family Medicine

## 2021-08-26 ENCOUNTER — Ambulatory Visit: Payer: BC Managed Care – PPO | Admitting: Family Medicine

## 2021-08-26 ENCOUNTER — Encounter: Payer: Self-pay | Admitting: Family Medicine

## 2021-08-26 ENCOUNTER — Other Ambulatory Visit: Payer: Self-pay

## 2021-08-26 VITALS — BP 138/70 | HR 64 | Ht 70.0 in | Wt 287.0 lb

## 2021-08-26 DIAGNOSIS — I10 Essential (primary) hypertension: Secondary | ICD-10-CM

## 2021-08-26 DIAGNOSIS — J0100 Acute maxillary sinusitis, unspecified: Secondary | ICD-10-CM

## 2021-08-26 DIAGNOSIS — R051 Acute cough: Secondary | ICD-10-CM | POA: Diagnosis not present

## 2021-08-26 DIAGNOSIS — J4521 Mild intermittent asthma with (acute) exacerbation: Secondary | ICD-10-CM | POA: Diagnosis not present

## 2021-08-26 DIAGNOSIS — E782 Mixed hyperlipidemia: Secondary | ICD-10-CM

## 2021-08-26 MED ORDER — MONTELUKAST SODIUM 10 MG PO TABS: 10.0000 mg | ORAL_TABLET | Freq: Every day | ORAL | 1 refills | Status: DC

## 2021-08-26 MED ORDER — AMLODIPINE BESYLATE 5 MG PO TABS: 5.0000 mg | ORAL_TABLET | Freq: Every day | ORAL | 1 refills | Status: DC

## 2021-08-26 MED ORDER — AMOXICILLIN-POT CLAVULANATE 875-125 MG PO TABS: 1.0000 | ORAL_TABLET | Freq: Two times a day (BID) | ORAL | 0 refills | Status: DC

## 2021-08-26 MED ORDER — ATORVASTATIN CALCIUM 10 MG PO TABS: 10.0000 mg | ORAL_TABLET | Freq: Every day | ORAL | 1 refills | Status: DC

## 2021-08-26 MED ORDER — ALBUTEROL SULFATE HFA 108 (90 BASE) MCG/ACT IN AERS: 2.0000 | INHALATION_SPRAY | Freq: Four times a day (QID) | RESPIRATORY_TRACT | 11 refills | Status: DC | PRN

## 2021-08-26 MED ORDER — HYDROCHLOROTHIAZIDE 12.5 MG PO TABS: 12.5000 mg | ORAL_TABLET | Freq: Every day | ORAL | 1 refills | Status: DC

## 2021-08-26 MED ORDER — GUAIFENESIN-CODEINE 100-10 MG/5ML PO SYRP: 5.0000 mL | ORAL_SOLUTION | Freq: Three times a day (TID) | ORAL | 0 refills | Status: DC | PRN

## 2021-08-26 NOTE — Progress Notes (Signed)
Date:  08/26/2021   Name:  Theresa Best   DOB:  March 31, 1961   MRN:  [id number]   Chief Complaint: Hypertension, Hyperlipidemia, Allergic Rhinitis , and Cough  Hypertension This is a chronic problem. The current episode started more than 1 year ago. The problem has been gradually improving since onset. The problem is controlled. Pertinent negatives include no anxiety, blurred vision, chest pain, headaches, malaise/fatigue, neck pain, orthopnea, palpitations, peripheral edema, PND, shortness of breath or sweats. There are no associated agents to hypertension. Risk factors for coronary artery disease include dyslipidemia. Past treatments include calcium channel blockers and diuretics. The current treatment provides moderate improvement. There are no compliance problems.  There is no history of chronic renal disease.  Hyperlipidemia This is a chronic problem. The current episode started more than 1 year ago. The problem is controlled. Recent lipid tests were reviewed and are normal. She has no history of chronic renal disease, diabetes, hypothyroidism, liver disease, obesity or nephrotic syndrome. Pertinent negatives include no chest pain, focal sensory loss, leg pain, myalgias or shortness of breath. Current antihyperlipidemic treatment includes statins.  Cough This is a new problem. The current episode started in the past 7 days. The problem has been waxing and waning. The problem occurs constantly. The cough is Productive of purulent sputum and productive of bloody sputum. Associated symptoms include nasal congestion and postnasal drip. Pertinent negatives include no chest pain, fever, headaches, myalgias, sore throat, shortness of breath or sweats. The treatment provided mild relief.  Sinusitis This is a new problem. The current episode started in the past 7 days. The problem has been gradually worsening since onset. There has been no fever. Associated symptoms include congestion, coughing and  sinus pressure. Pertinent negatives include no headaches, neck pain, shortness of breath or sore throat.   Lab Results  Component Value Date   NA 141 01/29/2021   K 4.5 01/29/2021   CO2 20 01/29/2021   GLUCOSE 101 (H) 01/29/2021   BUN 16 01/29/2021   CREATININE 0.64 01/29/2021   CALCIUM 9.4 01/29/2021   EGFR 102 01/29/2021   GFRNONAA 97 05/08/2020   Lab Results  Component Value Date   CHOL 286 (H) 01/29/2021   HDL 49 01/29/2021   LDLCALC 215 (H) 01/29/2021   TRIG 124 01/29/2021   CHOLHDL 5.8 (H) 01/29/2021   No results found for: TSH No results found for: HGBA1C No results found for: WBC, HGB, HCT, MCV, PLT No results found for: ALT, AST, GGT, ALKPHOS, BILITOT No results found for: 25OHVITD2, 25OHVITD3, VD25OH   Review of Systems  Constitutional:  Negative for fever and malaise/fatigue.  HENT:  Positive for congestion, postnasal drip and sinus pressure. Negative for sore throat.   Eyes:  Negative for blurred vision.       Retinal detachment  Respiratory:  Positive for cough. Negative for shortness of breath.   Cardiovascular:  Negative for chest pain, palpitations, orthopnea and PND.  Musculoskeletal:  Negative for myalgias and neck pain.  Neurological:  Negative for headaches.   Patient Active Problem List   Diagnosis Date Noted   Varicose veins of right lower extremity with pain 07/22/2019   Familial hypercholesterolemia 07/22/2019   Uterine prolapse 08/16/2018   SUI (stress urinary incontinence, female) 08/16/2018   POP-Q stage 2 cystocele 08/16/2018   Essential hypertension 11/03/2016   Class 3 severe obesity due to excess calories with serious comorbidity and body mass index (BMI) of 40.0 to 44.9 in adult [facility]) 11/03/2016  No Known Allergies  No past surgical history on file.  Social History   Tobacco Use   Smoking status: Never   Smokeless tobacco: Never  Substance Use Topics   Alcohol use: No    Alcohol/week: 0.0 standard drinks   Drug use: No      Medication list has been reviewed and updated.  Current Meds  Medication Sig   albuterol (PROVENTIL) (2.5 MG/3ML) 0.083% nebulizer solution Take 3 mLs (2.5 mg total) by nebulization every 6 (six) hours as needed for wheezing or shortness of breath.   albuterol (VENTOLIN HFA) 108 (90 Base) MCG/ACT inhaler Inhale 2 puffs into the lungs every 6 (six) hours as needed for wheezing or shortness of breath.   amLODipine (NORVASC) 5 MG tablet Take 1 tablet (5 mg total) by mouth daily.   atorvastatin (LIPITOR) 10 MG tablet Take 1 tablet (10 mg total) by mouth daily.   budesonide-formoterol (SYMBICORT) 80-4.5 MCG/ACT inhaler Inhale 2 puffs into the lungs 2 (two) times daily.   hydrochlorothiazide (HYDRODIURIL) 12.5 MG tablet Take 1 tablet (12.5 mg total) by mouth daily.   montelukast (SINGULAIR) 10 MG tablet Take 1 tablet (10 mg total) by mouth at bedtime.    PHQ 2/9 Scores 01/29/2021 10/13/2020 05/08/2020 07/22/2019  PHQ - 2 Score 0 0 0 0  PHQ- 9 Score 0 0 0 0    GAD 7 : Generalized Anxiety Score 01/29/2021 10/13/2020 05/08/2020 07/22/2019  Nervous, Anxious, on Edge 0 0 0 0  Control/stop worrying 0 0 0 0  Worry too much - different things 0 0 0 0  Trouble relaxing 0 0 0 0  Restless 0 0 0 0  Easily annoyed or irritable 0 0 0 0  Afraid - awful might happen 0 0 0 0  Total GAD 7 Score 0 0 0 0    BP Readings from Last 3 Encounters:  08/26/21 138/70  07/22/21 130/80  06/01/21 122/88    Physical Exam Vitals and nursing note reviewed.  Constitutional:      Appearance: She is well-developed.  HENT:     Head: Normocephalic.     Right Ear: Tympanic membrane and external ear normal.     Left Ear: Tympanic membrane and external ear normal.     Nose: Nose normal.     Mouth/Throat:     Mouth: Mucous membranes are moist.  Eyes:     General: Lids are everted, no foreign bodies appreciated. No scleral icterus.       Left eye: No foreign body or hordeolum.     Conjunctiva/sclera: Conjunctivae  normal.     Right eye: Right conjunctiva is not injected.     Left eye: Left conjunctiva is not injected.     Pupils: Pupils are equal, round, and reactive to light.  Neck:     Thyroid: No thyromegaly.     Vascular: No JVD.     Trachea: No tracheal deviation.  Cardiovascular:     Rate and Rhythm: Normal rate and regular rhythm.     Heart sounds: Normal heart sounds. No murmur heard.   No friction rub. No gallop.  Pulmonary:     Effort: Pulmonary effort is normal. No respiratory distress.     Breath sounds: Normal breath sounds. No wheezing, rhonchi or rales.  Abdominal:     General: Bowel sounds are normal.     Palpations: Abdomen is soft. There is no mass.     Tenderness: There is no abdominal tenderness. There is no guarding  or rebound.  Musculoskeletal:        General: No tenderness. Normal range of motion.     Cervical back: Normal range of motion and neck supple.  Lymphadenopathy:     Cervical: No cervical adenopathy.  Skin:    General: Skin is warm.     Findings: No rash.  Neurological:     Mental Status: She is alert and oriented to person, place, and time.     Cranial Nerves: No cranial nerve deficit.     Deep Tendon Reflexes: Reflexes normal.  Psychiatric:        Mood and Affect: Mood is not anxious or depressed.    Wt Readings from Last 3 Encounters:  08/26/21 287 lb (130.2 kg)  07/22/21 282 lb (127.9 kg)  06/01/21 281 lb (127.5 kg)    BP 138/70    Pulse 64    Ht _0  (1.778 m)    Wt 287 lb (130.2 kg)    BMI 41.18 kg/m   Assessment and Plan:  1. Acute maxillary sinusitis, recurrence not specified Chronic.  Controlled.  Stable.  We will initiate Augmentin 875 mg twice a day for 10 days. - amoxicillin-clavulanate (AUGMENTIN) 875-125 MG tablet; Take 1 tablet by mouth 2 (two) times daily.  Dispense: 20 tablet; Refill: 0  2. Acute cough New onset cough.  Persistent.  Uncontrolled.  We will treat with Robitussin-AC teaspoon every 8 hours as needed for  cough. - guaiFENesin-codeine (ROBITUSSIN AC) 100-10 MG/5ML syrup; Take 5 mLs by mouth 3 (three) times daily as needed for cough.  Dispense: 118 mL; Refill: 0  3. Essential hypertension Chronic.  Controlled.  Stable.  Blood pressure today is 138/70.  Continue amlodipine 5 mg once a day and hydrochlorothiazide 12.5 mg once a day. - amLODipine (NORVASC) 5 MG tablet; Take 1 tablet (5 mg total) by mouth daily.  Dispense: 90 tablet; Refill: 1 - hydrochlorothiazide (HYDRODIURIL) 12.5 MG tablet; Take 1 tablet (12.5 mg total) by mouth daily.  Dispense: 90 tablet; Refill: 1  4. Mild intermittent reactive airway disease with acute exacerbation Chronic.  Controlled.  Stable.  Continue albuterol 2 puffs every 6 hours as needed wheezing shortness of breath but probably on a daily basis during the pollen count season.  Also resumed Singulair 10 mg once a day. - albuterol (VENTOLIN HFA) 108 (90 Base) MCG/ACT inhaler; Inhale 2 puffs into the lungs every 6 (six) hours as needed for wheezing or shortness of breath.  Dispense: 6.7 g; Refill: 11 - montelukast (SINGULAIR) 10 MG tablet; Take 1 tablet (10 mg total) by mouth at bedtime.  Dispense: 90 tablet; Refill: 1  5. Mixed hyperlipidemia Chronic.  Controlled.  Stable.  Continue atorvastatin 10 mg once a day.  We will repeat with labs including lipid panel upon return for physical exam. - atorvastatin (LIPITOR) 10 MG tablet; Take 1 tablet (10 mg total) by mouth daily.  Dispense: 90 tablet; Refill: 1

## 2021-09-15 ENCOUNTER — Telehealth: Payer: Self-pay

## 2021-09-15 NOTE — Telephone Encounter (Signed)
Called pt let her know that she wouldn't not be able to get a mammogram on the same day as her CPE. Told pt that Delice Bison will call and schedule her mammogram on the same day as her appointment but it will not be done on the same day. Pt verbalized understanding. ? ?KP ?

## 2021-09-15 NOTE — Telephone Encounter (Signed)
Copied from CRM [id number]. Topic: General - Other ?>> Sep 15, 2021 12:08 PM McGill, Darlina Rumpf wrote: ?Reason for CRM: Pt is requesting a call back from [place]. Pt declined to provide any details about why she needs to speak with her.  ? ?Please advise. ? ? ?Delice Bison and Dr Yetta Barre will be out of the office this afternoon and will return tomorrow. ?

## 2021-09-17 ENCOUNTER — Ambulatory Visit (INDEPENDENT_AMBULATORY_CARE_PROVIDER_SITE_OTHER): Payer: BC Managed Care – PPO | Admitting: Family Medicine

## 2021-09-17 ENCOUNTER — Encounter: Payer: Self-pay | Admitting: Family Medicine

## 2021-09-17 VITALS — BP 130/78 | HR 76 | Ht 70.0 in | Wt 287.0 lb

## 2021-09-17 DIAGNOSIS — I10 Essential (primary) hypertension: Secondary | ICD-10-CM | POA: Diagnosis not present

## 2021-09-17 DIAGNOSIS — Z1211 Encounter for screening for malignant neoplasm of colon: Secondary | ICD-10-CM

## 2021-09-17 DIAGNOSIS — Z1231 Encounter for screening mammogram for malignant neoplasm of breast: Secondary | ICD-10-CM | POA: Diagnosis not present

## 2021-09-17 DIAGNOSIS — E782 Mixed hyperlipidemia: Secondary | ICD-10-CM

## 2021-09-17 DIAGNOSIS — Z0000 Encounter for general adult medical examination without abnormal findings: Secondary | ICD-10-CM

## 2021-09-17 LAB — HEMOCCULT GUIAC POC 1CARD (OFFICE): Fecal Occult Blood, POC: NEGATIVE

## 2021-09-17 NOTE — Progress Notes (Signed)
? ? ?Date:  09/17/2021  ? ?Name:  Theresa Best   DOB:  07-Aug-1960   MRN:  [id number] ? ? ?Chief Complaint: Annual Exam ? ?Patient is a 61 year old female who presents for a comprehensive physical exam. The patient reports the following problems: none. Health maintenance has been reviewed up to date.Theresa Best is a 61 y.o. female who presents today for her Complete Annual Exam. She feels well. She reports exercising walking/12,000steps/day. She reports she is sleeping well.   ?  ? ? ?Lab Results  ?Component Value Date  ? NA 141 01/29/2021  ? K 4.5 01/29/2021  ? CO2 20 01/29/2021  ? GLUCOSE 101 (H) 01/29/2021  ? BUN 16 01/29/2021  ? CREATININE 0.64 01/29/2021  ? CALCIUM 9.4 01/29/2021  ? EGFR 102 01/29/2021  ? GFRNONAA 97 05/08/2020  ? ?Lab Results  ?Component Value Date  ? CHOL 286 (H) 01/29/2021  ? HDL 49 01/29/2021  ? LDLCALC 215 (H) 01/29/2021  ? TRIG 124 01/29/2021  ? CHOLHDL 5.8 (H) 01/29/2021  ? ?No results found for: TSH ?No results found for: HGBA1C ?No results found for: WBC, HGB, HCT, MCV, PLT ?No results found for: ALT, AST, GGT, ALKPHOS, BILITOT ?No results found for: 25OHVITD2, [place], VD25OH  ? ?Review of Systems  ?Constitutional:  Negative for chills and fever.  ?HENT:  Negative for drooling, ear discharge, ear pain and sore throat.   ?Respiratory:  Negative for cough, shortness of breath and wheezing.   ?Cardiovascular:  Negative for chest pain, palpitations and leg swelling.  ?Gastrointestinal:  Negative for abdominal pain, blood in stool, constipation, diarrhea and nausea.  ?Endocrine: Negative for polydipsia.  ?Genitourinary:  Negative for dysuria, frequency, hematuria and urgency.  ?Musculoskeletal:  Negative for back pain, myalgias and neck pain.  ?Skin:  Negative for rash.  ?Allergic/Immunologic: Negative for environmental allergies.  ?Neurological:  Negative for dizziness and headaches.  ?Hematological:  Does not bruise/bleed easily.  ?Psychiatric/Behavioral:  Negative for suicidal  ideas. The patient is not nervous/anxious.   ? ?Patient Active Problem List  ? Diagnosis Date Noted  ? Varicose veins of right lower extremity with pain 07/22/2019  ? Familial hypercholesterolemia 07/22/2019  ? Uterine prolapse 08/16/2018  ? SUI (stress urinary incontinence, female) 08/16/2018  ? POP-Q stage 2 cystocele 08/16/2018  ? Essential hypertension 11/03/2016  ? Class 3 severe obesity due to excess calories with serious comorbidity and body mass index (BMI) of 40.0 to 44.9 in adult [facility]) 11/03/2016  ? ? ?No Known Allergies ? ?No past surgical history on file. ? ?Social History  ? ?Tobacco Use  ? Smoking status: Never  ? Smokeless tobacco: Never  ?Substance Use Topics  ? Alcohol use: No  ?  Alcohol/week: 0.0 standard drinks  ? Drug use: No  ? ? ? ?Medication list has been reviewed and updated. ? ?Current Meds  ?Medication Sig  ? albuterol (PROVENTIL) (2.5 MG/3ML) 0.083% nebulizer solution Take 3 mLs (2.5 mg total) by nebulization every 6 (six) hours as needed for wheezing or shortness of breath.  ? albuterol (VENTOLIN HFA) 108 (90 Base) MCG/ACT inhaler Inhale 2 puffs into the lungs every 6 (six) hours as needed for wheezing or shortness of breath.  ? amLODipine (NORVASC) 5 MG tablet Take 1 tablet (5 mg total) by mouth daily.  ? atorvastatin (LIPITOR) 10 MG tablet Take 1 tablet (10 mg total) by mouth daily.  ? budesonide-formoterol (SYMBICORT) 80-4.5 MCG/ACT inhaler Inhale 2 puffs into the lungs 2 (two) times daily.  ?  hydrochlorothiazide (HYDRODIURIL) 12.5 MG tablet Take 1 tablet (12.5 mg total) by mouth daily.  ? montelukast (SINGULAIR) 10 MG tablet Take 1 tablet (10 mg total) by mouth at bedtime.  ? [DISCONTINUED] guaiFENesin-codeine (ROBITUSSIN AC) 100-10 MG/5ML syrup Take 5 mLs by mouth 3 (three) times daily as needed for cough.  ? ? ? ?  09/17/2021  ?  8:51 AM 01/29/2021  ?  8:10 AM 10/13/2020  ?  3:07 PM 05/08/2020  ?  4:11 PM  ?GAD 7 : Generalized Anxiety Score  ?Nervous, Anxious, on Edge 0 0 0 0   ?Control/stop worrying 0 0 0 0  ?Worry too much - different things 0 0 0 0  ?Trouble relaxing 0 0 0 0  ?Restless 0 0 0 0  ?Easily annoyed or irritable 0 0 0 0  ?Afraid - awful might happen 0 0 0 0  ?Total GAD 7 Score 0 0 0 0  ?Anxiety Difficulty Not difficult at all     ? ? ? ?  09/17/2021  ?  8:51 AM  ?Depression screen PHQ 2/9  ?Decreased Interest 0  ?Down, Depressed, Hopeless 0  ?PHQ - 2 Score 0  ?Altered sleeping 0  ?Tired, decreased energy 0  ?Change in appetite 0  ?Feeling bad or failure about yourself  0  ?Trouble concentrating 0  ?Moving slowly or fidgety/restless 0  ?Suicidal thoughts 0  ?PHQ-9 Score 0  ?Difficult doing work/chores Not difficult at all  ? ? ?BP Readings from Last 3 Encounters:  ?09/17/21 130/78  ?08/26/21 138/70  ?07/22/21 130/80  ? ? ?Physical Exam ?Vitals and nursing note reviewed.  ?Constitutional:   ?   Appearance: She is well-developed and well-groomed.  ?HENT:  ?   Head: Normocephalic.  ?   Jaw: There is normal jaw occlusion.  ?   Right Ear: Hearing, tympanic membrane, ear canal and external ear normal.  ?   Left Ear: Hearing, tympanic membrane, ear canal and external ear normal.  ?   Nose: Nose normal.  ?   Mouth/Throat:  ?   Lips: Pink.  ?   Mouth: Mucous membranes are moist.  ?   Dentition: Normal dentition.  ?   Tongue: No lesions.  ?   Palate: No lesions.  ?   Pharynx: Oropharynx is clear.  ?Eyes:  ?   General: Lids are normal. Lids are everted, no foreign bodies appreciated. Gaze aligned appropriately. No visual field deficit or scleral icterus.    ?   Left eye: No foreign body or hordeolum.  ?   Extraocular Movements: Extraocular movements intact.  ?   Conjunctiva/sclera: Conjunctivae normal.  ?   Right eye: Right conjunctiva is not injected.  ?   Left eye: Left conjunctiva is not injected.  ?   Pupils: Pupils are equal, round, and reactive to light.  ?   Funduscopic exam: ?   Right eye: No AV nicking. Red reflex present.     ?   Left eye: No AV nicking. Red reflex  present. ?Neck:  ?   Thyroid: No thyroid mass, thyromegaly or thyroid tenderness.  ?   Vascular: Normal carotid pulses. No carotid bruit, hepatojugular reflux or JVD.  ?   Trachea: Trachea and phonation normal. No tracheal deviation.  ?Cardiovascular:  ?   Rate and Rhythm: Normal rate and regular rhythm.  ?   Chest Wall: PMI is not displaced.  ?   Pulses: Normal pulses.     ?     Carotid  pulses are 2+ on the right side and 2+ on the left side. ?     Radial pulses are 2+ on the right side and 2+ on the left side.  ?     Femoral pulses are 2+ on the right side and 2+ on the left side. ?     Popliteal pulses are 2+ on the right side and 2+ on the left side.  ?     Dorsalis pedis pulses are 2+ on the right side and 2+ on the left side.  ?     Posterior tibial pulses are 2+ on the right side and 2+ on the left side.  ?   Heart sounds: Normal heart sounds, S1 normal and S2 normal. No murmur heard. ?No systolic murmur is present.  ?No diastolic murmur is present.  ?  No friction rub. No gallop. No S3 or S4 sounds.  ?Pulmonary:  ?   Effort: Pulmonary effort is normal. No respiratory distress.  ?   Breath sounds: Normal breath sounds. No decreased breath sounds, wheezing, rhonchi or rales.  ?Chest:  ?   Chest wall: No mass.  ?Breasts: ?   Right: Normal. No swelling, bleeding, inverted nipple, mass, nipple discharge, skin change or tenderness.  ?   Left: Normal. No swelling, bleeding, inverted nipple, mass, nipple discharge, skin change or tenderness.  ?Abdominal:  ?   General: Bowel sounds are normal.  ?   Palpations: Abdomen is soft. There is no hepatomegaly, splenomegaly or mass.  ?   Tenderness: There is no abdominal tenderness. There is no guarding or rebound.  ?   Hernia: There is no hernia in the umbilical area, ventral area, left inguinal area or right inguinal area.  ?Genitourinary: ?   Rectum: Normal. Guaiac result negative. No mass.  ?Musculoskeletal:     ?   General: No tenderness. Normal range of motion.  ?    Cervical back: Full passive range of motion without pain, normal range of motion and neck supple.  ?Lymphadenopathy:  ?   Head:  ?   Right side of head: No submental or submandibular adenopathy.  ?   Left side of head: No sub

## 2021-09-17 NOTE — Patient Instructions (Signed)
GUIDELINES FOR  ?LOW-CHOLESTEROL, LOW-TRIGLYCERIDE DIETS  ?  ?FOODS TO USE  ? ?MEATS, FISH Choose lean meats (chicken, turkey, veal, and non-fatty cuts of beef with excess fat trimmed; one serving = 3 oz of cooked meat). Also, fresh or frozen fish, canned fish packed in water, and shellfish (lobster, crabs, shrimp, and oysters). Limit use to no more than one serving of one of these per week. Shellfish are high in cholesterol but low in saturated fat and should be used sparingly. Meats and fish should be broiled (pan or oven) or baked on a rack.  ?EGGS Egg substitutes and egg whites (use freely). Egg yolks (limit two per week).  ?FRUITS Eat three servings of fresh fruit per day (1 serving = ? cup). Be sure to have at least one citrus fruit daily. Frozen and canned fruit with no sugar or syrup added may be used.  ?VEGETABLES Most vegetables are not limited (see next page). One dark-green (string beans, escarole) or one deep yellow (squash) vegetable is recommended daily. Cauliflower, broccoli, and celery, as well as potato skins, are recommended for their fiber content. (Fiber is associated with cholesterol reduction) It is preferable to steam vegetables, but they may be boiled, strained, or braised with polyunsaturated vegetable oil (see below).  ?BEANS Dried peas or beans (1 serving = ? cup) may be used as a bread substitute.  ?NUTS Almonds, walnuts, and peanuts may be used sparingly  ?(1 serving = 1 Tablespoonful). Use pumpkin, sesame, or sunflower seeds.  ?BREADS, GRAINS One roll or one slice of whole grain or enriched bread may be used, or three soda crackers or four pieces of melba toast as a substitute. Spaghetti, rice or noodles (? cup) or ? large ear of corn may be used as a bread substitute. In preparing these foods do not use butter or shortening, use soft margarine. Also use egg and sugar substitutes.  Choose high fiber grains, such as oats and whole wheat.  ?CEREALS Use ? cup of hot cereal or ? cup of  cold cereal per day. Add a sugar substitute if desired, with 99% fat free or skim milk.  ?MILK PRODUCTS Always use 99% fat free or skim milk, dairy products such as low fat cheeses (farmer's uncreamed diet cottage), low-fat yogurt, and powdered skim milk.  ?FATS, OILS Use soft (not stick) margarine; vegetable oils that are high in polyunsaturated fats (such as safflower, sunflower, soybean, corn, and cottonseed). Always refrigerate meat drippings to harden the fat and remove it before preparing gravies  ?DESSERTS, SNACKS Limit to two servings per day; substitute each serving for a bread/cereal serving: ice milk, water sherbet (1/4 cup); unflavored gelatin or gelatin flavored with sugar substitute (1/3 cup); pudding prepared with skim milk (1/2 cup); egg white souffl?s; unbuttered popcorn (1 ? cups). Substitute carob for chocolate.  ?BEVERAGES Fresh fruit juices (limit 4 oz per day); black coffee, plain or herbal teas; soft drinks with sugar substitutes; club soda, preferably salt-free; cocoa made with skim milk or nonfat dried milk and water (sugar substitute added if desired); clear broth. Alcohol: limit two servings per day (see second page).  ?MISCELLANEOUS ? You may use the following freely: vinegar, spices, herbs, nonfat bouillon, mustard, Worcestershire sauce, soy sauce, flavoring essence.  ? ? ? ? ? ? ? ? ? ? ? ? ? ? ? ? ?GUIDELINES FOR  ?LOW-CHOLESTEROL, LOW TRIGLYCERIDE DIETS  ?  ?FOODS TO AVOID  ? ?MEATS, FISH Marbled beef, pork, bacon, sausage, and other pork products; fatty   fowl (duck, goose); skin and fat of turkey and chicken; processed meats; luncheon meats (salami, bologna); frankfurters and fast-food hamburgers (theyre loaded with fat); organ meats (kidneys, liver); canned fish packed in oil.  ?EGGS Limit egg yolks to two per week.   ?FRUITS Coconuts (rich in saturated fats).  ?VEGETABLES Avoid avocados. Starchy vegetables (potatoes, corn, lima beans, dried peas, beans) may be used only if  substitutes for a serving of bread or cereal. (Baked potato skin, however, is desirable for its fiber content.  ?BEANS Commercial baked beans with sugar and/or pork added.  ?NUTS Avoid nuts.  Limit peanuts and walnuts to one tablespoonful per day.  ?BREADS, GRAINS Any baked goods with shortening and/or sugar. Commercial mixes with dried eggs and whole milk. Avoid sweet rolls, doughnuts, breakfast pastries (Danish), and sweetened packaged cereals (the added sugar converts readily to triglycerides).  ?MILK PRODUCTS Whole milk and whole-milk packaged goods; cream; ice cream; whole-milk puddings, yogurt, or cheeses; nondairy cream substitutes.  ?FATS, OILS Butter, lard, animal fats, bacon drippings, gravies, cream sauces as well as palm and coconut oils. All these are high in saturated fats. Examine labels on cholesterol free products for hydrogenated fats. (These are oils that have been hardened into solids and in the process have become saturated.)  ?DESSERTS, SNACKS Fried snack foods like potato chips; chocolate; candies in general; jams, jellies, syrups; whole- milk puddings; ice cream and milk sherbets; hydrogenated peanut butter.  ?BEVERAGES Sugared fruit juices and soft drinks; cocoa made with whole milk and/or sugar. When using alcohol (1 oz liquor, 5 oz beer, or 2 ? oz dry table wine per serving), one serving must be substituted for one bread or cereal serving (limit, two servings of alcohol per day).  ? SPECIAL NOTES  ?  Remember that even non-limited foods should be used in moderation. ?While on a cholesterol-lowering diet, be sure to avoid animal fats and marbled meats. ?3. While on a triglyceride-lowering diet, be sure to avoid sweets and to control the amount of carbohydrates you eat (starchy foods such as flour, bread, potatoes).While on a tri-glyceride-lowering diet, be sure to avoid sweets ?Buy a good low-fat cookbook, such as the one published by the American Heart Association. ?Consult your physician  if you have any questions.  ? ? ? ? ? ? ? ? ? ? ? ? ? ?[facility] Low Glycemic Diet Plan ? ? ?Low Glycemic Foods (20-49) Moderate Glycemic Foods (50-69) High Glycemic Foods (70-100)  ?    ?Breakfast Creals Breakfast Cereals Breakfast Cereals  ?All Bran All-Bran Fruit'n Oats  ? Bran Buds Bran Chex  ? Cheerios Corn chex  ?  ?Fiber One Oatmeal (not instant)  ? Just Right Mini-Wheats  ? Corn Flakes Cream of Wheat  ?  ?Oat Bran Special K Swiss Muesli  ? Grape Nuts Grape Nut Flakes  ?  ?  Grits Nutri-Grain  ?  ?Fruits and fruit juice: Fruits Puffed Rice Puffed Wheat  ?  ?(Limit to 1-2 Servings per day) Banana (under-ride) Dates  ? Rice Chex Rice Krispies  ?  ?Apples Apricots (fresh/dried)  ? Figs Grapes  ? Shredded Wheat Team  ?  ?Blackberries Blueberries  ? Kiwi Mango  ? Total   ?  ?Cherries Cranberries  ? Oranges Raisins  ?   ?Peaches Pears  ?  Fruits  ?Plums Prunes  ? Fruit Juices Pineapple Watermelon  ?  ?Grapefruit Raspberries  ? Cranberry Juice Orange Juice  ? Banana (over-ripe)   ?  ?Strawberries Tangerines  ?    ?  Apple Juice Grapefruit Juice  ? Beans and Legumes Beverages  ?Tomato Juice   ? [place]-type baked beans Sodas, sweet tea, pineapple juice  ? Canned pinto, kidney, or navy beans   ?Beans and Legumes (fresh-cooked) Green peas Vegetables  ?Black-eyed peas Butter Beans  ?  Potato, baked, boiled, fried, mashed  ?Chick peas Lentils  ? Vegetables French fries  ?Green beans Lima beans  ? Beets Carrots  ? Canned or frozen corn  ?Kidney beans Navy beans  ? Sweet potato Yam  ? Parsnips  ?Pinto beans Snow peas  ? Corn on the cob Winter squash  ?    ?Non-starchy vegetables Grains Breads  ?Asparagus, avocado, broccoli, cabbage Cornmeal Rice, brown  ? Most breads (white and whole grain)  ?cauliflower, celery, cucumber, greens Rice, white Couscous  ? Bagels Bread sticks  ?  ?lettuce, mushrooms, peppers, tomatoes  Bread stuffing Kaiser roll  ?  ?okra, onions, spinach, summer squash Pasta Dinner rolls  ? Macaroni  Pizza, cheese  ?   ?Grains Ravioli, meat filled Spaghetti, white  ? Grains  ?Barley Bulgur  ?  Rice, instant Tapioca, with milk  ?  ?Rye Wild rice  ? Nuts   ? Cashews Macadamia  ? Candy and most cookies  ?Nuts and o

## 2021-09-18 LAB — CBC WITH DIFFERENTIAL/PLATELET
Basophils Absolute: 0.1 10*3/uL (ref 0.0–0.2)
Basos: 1 %
EOS (ABSOLUTE): 0.1 10*3/uL (ref 0.0–0.4)
Eos: 1 %
Hematocrit: 43.3 % (ref 34.0–46.6)
Hemoglobin: 14.1 g/dL (ref 11.1–15.9)
Immature Grans (Abs): 0 10*3/uL (ref 0.0–0.1)
Immature Granulocytes: 0 %
Lymphocytes Absolute: 1.7 10*3/uL (ref 0.7–3.1)
Lymphs: 29 %
MCH: 29.4 pg (ref 26.6–33.0)
MCHC: 32.6 g/dL (ref 31.5–35.7)
MCV: 90 fL (ref 79–97)
Monocytes Absolute: 0.4 10*3/uL (ref 0.1–0.9)
Monocytes: 6 %
Neutrophils Absolute: 3.7 10*3/uL (ref 1.4–7.0)
Neutrophils: 63 %
Platelets: 299 10*3/uL (ref 150–450)
RBC: 4.79 x10E6/uL (ref 3.77–5.28)
RDW: 13.4 % (ref 11.7–15.4)
WBC: 5.9 10*3/uL (ref 3.4–10.8)

## 2021-09-18 LAB — RENAL FUNCTION PANEL
Albumin: 4.5 g/dL (ref 3.8–4.9)
BUN/Creatinine Ratio: 26 — ABNORMAL HIGH (ref 9–23)
BUN: 14 mg/dL (ref 6–24)
CO2: 23 mmol/L (ref 20–29)
Calcium: 9.9 mg/dL (ref 8.7–10.2)
Chloride: 100 mmol/L (ref 96–106)
Creatinine, Ser: 0.53 mg/dL — ABNORMAL LOW (ref 0.57–1.00)
Glucose: 99 mg/dL (ref 70–99)
Phosphorus: 3.8 mg/dL (ref 3.0–4.3)
Potassium: 4.4 mmol/L (ref 3.5–5.2)
Sodium: 138 mmol/L (ref 134–144)
eGFR: 106 mL/min/{1.73_m2} (ref >59)

## 2021-09-18 LAB — LIPID PANEL WITH LDL/HDL RATIO
Cholesterol, Total: 263 mg/dL — ABNORMAL HIGH (ref 100–199)
HDL: 53 mg/dL (ref >39)
LDL Chol Calc (NIH): 189 mg/dL — ABNORMAL HIGH (ref 0–99)
LDL/HDL Ratio: 3.6 ratio — ABNORMAL HIGH (ref 0.0–3.2)
Triglycerides: 119 mg/dL (ref 0–149)
VLDL Cholesterol Cal: 21 mg/dL (ref 5–40)

## 2021-10-22 ENCOUNTER — Ambulatory Visit
Admission: RE | Admit: 2021-10-22 | Discharge: 2021-10-22 | Disposition: A | Discharge status: Home / Self Care | Payer: BC Managed Care – PPO | Source: Ambulatory Visit | Attending: Family Medicine | Admitting: Family Medicine

## 2021-10-22 DIAGNOSIS — Z1231 Encounter for screening mammogram for malignant neoplasm of breast: Secondary | ICD-10-CM | POA: Diagnosis not present

## 2021-12-17 ENCOUNTER — Ambulatory Visit: Payer: Self-pay

## 2021-12-17 NOTE — Telephone Encounter (Signed)
Pt stated she noticed that she has a ringworm and would like a call back to advise what she can put on it. Cb# [phone]   Voice mailbox has not been set up, unable to leave message.

## 2021-12-17 NOTE — Telephone Encounter (Signed)
Chief Complaint: Ringworm Symptoms: Tingling, not itching, 2 spots on the right arm above elbow Frequency: Onset today Pertinent Negatives: Patient denies other symptoms Disposition: [] ED /[] Urgent Care (no appt availability in office) / [] Appointment(In office/virtual)/ []  [place] [facility]/ [x] Home Care/ [] Refused Recommended Disposition /[] [place] [facility]/ []  Follow-up with PCP Additional Notes: Advised to call the office on Monday if OTC treatments not working.  Summary: medication question   Pt stated she noticed that she has a ringworm and would like a call back to advise what she can put on it. Cb# [phone]       Reason for Disposition  Ringworm  Answer Assessment - Initial Assessment Questions 1. APPEARANCE of RASH: "What does the rash look like?"      2 raised circles 2. LOCATION: "Where is the rash located?"      Right arm half way down to the elbow 3. SIZE: "How large are the spots?"      2 penny size spots 4. NUMBER: "How many spots are there?"      2 5. ONSET: "When did the ringworm start?"     Noticed a couple of hours ago 6. OTHER SYMPTOMS: "Do you have any other symptoms?" (e.g., fever, headache, etc.)     Tingling, no itching 7. PREGNANCY: "Is there any chance you are pregnant?" "When was your last menstrual period?"     N/A  Protocols used: Ringworm-A-AH

## 2021-12-31 ENCOUNTER — Ambulatory Visit: Payer: Self-pay

## 2021-12-31 NOTE — Telephone Encounter (Signed)
Chief Complaint: Urinary symptoms Symptoms: Burning, odor, frequency, not emptying bladder completley Frequency: Onset 4 hours ago Pertinent Negatives: Patient denies pain and other symptoms Disposition: [] ED /[x] Urgent Care (no appt availability in office) / [] Appointment(In office/virtual)/ []  [place] [facility]/ [] Home Care/ [x] Refused Recommended Disposition /[] [place] [facility]/ []  Follow-up with PCP Additional Notes: Patient refused UC asked for an appointment on Monday and says she could've come in today. She asks to speak to [place], . I called the office and spoke to [place], [facility] who says she's the only one in the office that and Dr. Sunday have left for the day, she says the patient will need to go to the UC as advised or she can put her on the schedule for Monday. I advised the appointment is made and asked if she would speak to the patient to let her know, since she wanted to speak to someone, she agrees and the call connected without difficulty.      Summary: possible UTI   Pt called in to speak with her PCP's CMA. Pt says that she just started experiencing some some sx that she believes is a possible bladder infection. Pt says that she has a little odor in her vaginal area, pt says that it's hard for her to urinate. When urinate she only experience a little bit at a time.   Pt says that she would like to know if PCP would send something in to the pharmacy for her due to it being the weekend. Pt says that she will follow up with PCP after if she has to?   Pt would like to be advised further.      Reason for Disposition  Urinating more frequently than usual (i.e., frequency)  Answer Assessment - Initial Assessment Questions 1. SYMPTOM: "What's the main symptom you're concerned about?" (e.g., frequency, incontinence)     Frequency, burning 2. ONSET: "When did the urine problems start?"     4 hours ago 3. PAIN: "Is there any pain?" If Yes, ask: "How bad is it?"  (Scale: 1-10; mild, moderate, severe)     No 4. CAUSE: "What do you think is causing the symptoms?"     UTI 5. OTHER SYMPTOMS: "Do you have any other symptoms?" (e.g., fever, flank pain, blood in urine, pain with urination)     Pain with urination, frequency, not emptying bladder all the way 6. PREGNANCY: "Is there any chance you are pregnant?" "When was your last menstrual period?"     N/A  Protocols used: Urinary Symptoms-A-AH

## 2022-01-03 ENCOUNTER — Ambulatory Visit: Payer: BC Managed Care – PPO | Admitting: Family Medicine

## 2022-01-04 ENCOUNTER — Other Ambulatory Visit: Payer: Self-pay

## 2022-01-04 ENCOUNTER — Ambulatory Visit: Payer: Self-pay | Admitting: *Deleted

## 2022-01-04 DIAGNOSIS — J4521 Mild intermittent asthma with (acute) exacerbation: Secondary | ICD-10-CM

## 2022-01-04 MED ORDER — ALBUTEROL SULFATE HFA 108 (90 BASE) MCG/ACT IN AERS: 2.0000 | INHALATION_SPRAY | Freq: Four times a day (QID) | RESPIRATORY_TRACT | 1 refills | Status: DC | PRN

## 2022-01-04 NOTE — Telephone Encounter (Addendum)
Summary: discuss medication   Patient states her current inhaler is on recall and inquiring which alternative inhaler can be prescribe.   Please assist patient further      Patient is requesting an alternative inhaler- Albuterol. Pharmacy-[facility]/ [facility]

## 2022-01-04 NOTE — Telephone Encounter (Signed)
Reason for Disposition  [1] Caller has NON-URGENT medicine question about med that PCP prescribed AND [2] triager unable to answer question  Answer Assessment - Initial Assessment Questions 1. NAME of MEDICINE: "What medicine(s) are you calling about?"     Got letter from [facility]- inhaler is on recall 2. QUESTION: "What is your question?" (e.g., double dose of medicine, side effect)     Needs alternative 3. PRESCRIBER: "Who prescribed the medicine?" Reason: if prescribed by specialist, call should be referred to that group.     PCP  Protocols used: Medication Question Call-A-AH

## 2022-02-22 ENCOUNTER — Ambulatory Visit: Payer: Self-pay | Admitting: *Deleted

## 2022-02-22 NOTE — Telephone Encounter (Signed)
Reason for Disposition . [1] MODERATE rectal bleeding (small blood clots, passing blood without stool, or toilet water turns red) AND [2] more than once a day  Answer Assessment - Initial Assessment Questions 1. APPEARANCE of BLOOD: "What color is it?" "Is it passed separately, on the surface of the stool, or mixed in with the stool?"      red 2. AMOUNT: "How much blood was passed?"      Gush- red 3. FREQUENCY: "How many times has blood been passed with the stools?"      Every time has bowel 4. ONSET: "When was the blood first seen in the stools?" (Days or weeks)      Started 2 couple weeks ago 5. DIARRHEA: "Is there also some diarrhea?" If Yes, ask: "How many diarrhea stools in the past 24 hours?"      no 6. CONSTIPATION: "Do you have constipation?" If Yes, ask: "How bad is it?"     no 7. RECURRENT SYMPTOMS: "Have you had blood in your stools before?" If Yes, ask: "When was the last time?" and "What happened that time?"      Possible hemorrhoid 8. BLOOD THINNERS: "Do you take any blood thinners?" (e.g., Coumadin/warfarin, Pradaxa/dabigatran, aspirin)     no 9. OTHER SYMPTOMS: "Do you have any other symptoms?"  (e.g., abdomen pain, vomiting, dizziness, fever)     no 10. PREGNANCY: "Is there any chance you are pregnant?" "When was your last menstrual period?"  Protocols used: Rectal Bleeding-A-AH

## 2022-02-22 NOTE — Telephone Encounter (Signed)
  Chief Complaint: rectal bleeding Symptoms: heavy rectal bleeding Frequency: 2 weeks Pertinent Negatives: Patient denies pain Disposition: [] ED /[] Urgent Care (no appt availability in office) / [] Appointment(In office/virtual)/ []  [place] [facility]/ [] Home Care/ [] Refused Recommended Disposition /[] [place] [facility]/ []  Follow-up with PCP Additional Notes: Patient refused to see anyone but PCP- call transferred to office- they took call to handle

## 2022-02-23 ENCOUNTER — Other Ambulatory Visit
Admission: RE | Admit: 2022-02-23 | Discharge: 2022-02-23 | Disposition: A | Discharge status: Home / Self Care | Payer: BC Managed Care – PPO | Attending: Family Medicine | Admitting: Family Medicine

## 2022-02-23 ENCOUNTER — Ambulatory Visit: Payer: BC Managed Care – PPO | Admitting: Family Medicine

## 2022-02-23 ENCOUNTER — Encounter: Payer: Self-pay | Admitting: Family Medicine

## 2022-02-23 ENCOUNTER — Telehealth: Payer: Self-pay | Admitting: Family Medicine

## 2022-02-23 VITALS — BP 138/70 | HR 80 | Ht 70.0 in | Wt 292.0 lb

## 2022-02-23 DIAGNOSIS — K922 Gastrointestinal hemorrhage, unspecified: Secondary | ICD-10-CM

## 2022-02-23 DIAGNOSIS — J4521 Mild intermittent asthma with (acute) exacerbation: Secondary | ICD-10-CM

## 2022-02-23 DIAGNOSIS — Z23 Encounter for immunization: Secondary | ICD-10-CM

## 2022-02-23 DIAGNOSIS — K5900 Constipation, unspecified: Secondary | ICD-10-CM | POA: Diagnosis not present

## 2022-02-23 DIAGNOSIS — K625 Hemorrhage of anus and rectum: Secondary | ICD-10-CM | POA: Insufficient documentation

## 2022-02-23 DIAGNOSIS — I878 Other specified disorders of veins: Secondary | ICD-10-CM | POA: Diagnosis not present

## 2022-02-23 LAB — CBC WITH DIFFERENTIAL/PLATELET
Abs Immature Granulocytes: 0.03 10*3/uL (ref 0.00–0.07)
Basophils Absolute: 0.1 10*3/uL (ref 0.0–0.1)
Basophils Relative: 1 %
Eosinophils Absolute: 0.1 10*3/uL (ref 0.0–0.5)
Eosinophils Relative: 1 %
HCT: 42.3 % (ref 36.0–46.0)
Hemoglobin: 13.8 g/dL (ref 12.0–15.0)
Immature Granulocytes: 0 %
Lymphocytes Relative: 25 %
Lymphs Abs: 2.1 10*3/uL (ref 0.7–4.0)
MCH: 29.6 pg (ref 26.0–34.0)
MCHC: 32.6 g/dL (ref 30.0–36.0)
MCV: 90.6 fL (ref 80.0–100.0)
Monocytes Absolute: 0.6 10*3/uL (ref 0.1–1.0)
Monocytes Relative: 8 %
Neutro Abs: 5.3 10*3/uL (ref 1.7–7.7)
Neutrophils Relative %: 65 %
Platelets: 328 10*3/uL (ref 150–400)
RBC: 4.67 MIL/uL (ref 3.87–5.11)
RDW: 13.9 % (ref 11.5–15.5)
WBC: 8.2 10*3/uL (ref 4.0–10.5)
nRBC: 0 % (ref 0.0–0.2)

## 2022-02-23 MED ORDER — MONTELUKAST SODIUM 10 MG PO TABS: 10.0000 mg | ORAL_TABLET | Freq: Every day | ORAL | 1 refills | Status: DC

## 2022-02-23 NOTE — Telephone Encounter (Signed)
Noted  KP 

## 2022-02-23 NOTE — Telephone Encounter (Signed)
Copied from CRM [id number]. Topic: General - Inquiry >> Feb 23, 2022  3:43 PM Payton Doughty wrote: Reason for CRM: pt is going to send documents over mtchart (or attempt) that she needs Dr Yetta Barre to see.  I have hepled her to do, and she feels confident she can do.

## 2022-02-23 NOTE — Progress Notes (Signed)
Date:  02/23/2022   Name:  Theresa Best   DOB:  31-Jan-1961   MRN:  [id number]   Chief Complaint: Rectal Bleeding, Allergic Rhinitis  (Needs refill on montelukast), Flu Vaccine, and leg discoloration (R) lower leg discolored)  Rectal Bleeding  The current episode started more than 2 weeks ago (2 weeks). The onset was sudden. The problem occurs occasionally. The problem has been gradually improving. The pain is moderate. The stool is described as bloody. Prior successful therapies include stool softeners. Pertinent negatives include no fever, no abdominal pain, no diarrhea, no hematemesis, no hemorrhoids, no nausea, no hematuria, no chest pain, no headaches, no coughing and no rash.    Lab Results  Component Value Date   NA 138 09/17/2021   K 4.4 09/17/2021   CO2 23 09/17/2021   GLUCOSE 99 09/17/2021   BUN 14 09/17/2021   CREATININE 0.53 (L) 09/17/2021   CALCIUM 9.9 09/17/2021   EGFR 106 09/17/2021   GFRNONAA 97 05/08/2020   Lab Results  Component Value Date   CHOL 263 (H) 09/17/2021   HDL 53 09/17/2021   LDLCALC 189 (H) 09/17/2021   TRIG 119 09/17/2021   CHOLHDL 5.8 (H) 01/29/2021   No results found for: "TSH" No results found for: "HGBA1C" Lab Results  Component Value Date   WBC 5.9 09/17/2021   HGB 14.1 09/17/2021   HCT 43.3 09/17/2021   MCV 90 09/17/2021   PLT 299 09/17/2021   No results found for: "ALT", "AST", "GGT", "ALKPHOS", "BILITOT" No results found for: "25OHVITD2", "25OHVITD3", "VD25OH"   Review of Systems  Constitutional:  Negative for chills and fever.  HENT:  Negative for drooling, ear discharge, ear pain and sore throat.   Respiratory:  Negative for cough, shortness of breath and wheezing.   Cardiovascular:  Negative for chest pain, palpitations and leg swelling.  Gastrointestinal:  Positive for hematochezia. Negative for abdominal pain, blood in stool, constipation, diarrhea, hematemesis, hemorrhoids and nausea.  Endocrine: Negative for  polydipsia.  Genitourinary:  Negative for dysuria, frequency, hematuria and urgency.  Musculoskeletal:  Negative for back pain, myalgias and neck pain.  Skin:  Negative for rash.  Allergic/Immunologic: Negative for environmental allergies.  Neurological:  Negative for dizziness and headaches.  Hematological:  Does not bruise/bleed easily.  Psychiatric/Behavioral:  Negative for suicidal ideas. The patient is not nervous/anxious.     Patient Active Problem List   Diagnosis Date Noted   Varicose veins of right lower extremity with pain 07/22/2019   Familial hypercholesterolemia 07/22/2019   Uterine prolapse 08/16/2018   SUI (stress urinary incontinence, female) 08/16/2018   POP-Q stage 2 cystocele 08/16/2018   Essential hypertension 11/03/2016   Class 3 severe obesity due to excess calories with serious comorbidity and body mass index (BMI) of 40.0 to 44.9 in adult [facility]) 11/03/2016    No Known Allergies  No past surgical history on file.  Social History   Tobacco Use   Smoking status: Never   Smokeless tobacco: Never  Substance Use Topics   Alcohol use: No    Alcohol/week: 0.0 standard drinks of alcohol   Drug use: No     Medication list has been reviewed and updated.  Current Meds  Medication Sig   albuterol (PROVENTIL) (2.5 MG/3ML) 0.083% nebulizer solution Take 3 mLs (2.5 mg total) by nebulization every 6 (six) hours as needed for wheezing or shortness of breath.   albuterol (VENTOLIN HFA) 108 (90 Base) MCG/ACT inhaler Inhale 2 puffs into the lungs every  6 (six) hours as needed for wheezing or shortness of breath.   amLODipine (NORVASC) 5 MG tablet Take 1 tablet (5 mg total) by mouth daily.   atorvastatin (LIPITOR) 10 MG tablet Take 1 tablet (10 mg total) by mouth daily.   budesonide-formoterol (SYMBICORT) 80-4.5 MCG/ACT inhaler Inhale 2 puffs into the lungs 2 (two) times daily.   hydrochlorothiazide (HYDRODIURIL) 12.5 MG tablet Take 1 tablet (12.5 mg total) by mouth  daily.   montelukast (SINGULAIR) 10 MG tablet Take 1 tablet (10 mg total) by mouth at bedtime.       02/23/2022   11:07 AM 09/17/2021    8:51 AM 01/29/2021    8:10 AM 10/13/2020    3:07 PM  GAD 7 : Generalized Anxiety Score  Nervous, Anxious, on Edge 0 0 0 0  Control/stop worrying 1 0 0 0  Worry too much - different things 1 0 0 0  Trouble relaxing 0 0 0 0  Restless 0 0 0 0  Easily annoyed or irritable 0 0 0 0  Afraid - awful might happen 0 0 0 0  Total GAD 7 Score 2 0 0 0  Anxiety Difficulty Not difficult at all Not difficult at all         02/23/2022   11:06 AM 09/17/2021    8:51 AM 01/29/2021    8:09 AM  Depression screen PHQ 2/9  Decreased Interest 1 0 0  Down, Depressed, Hopeless 0 0 0  PHQ - 2 Score 1 0 0  Altered sleeping 0 0 0  Tired, decreased energy 1 0 0  Change in appetite 1 0 0  Feeling bad or failure about yourself  0 0 0  Trouble concentrating 0 0 0  Moving slowly or fidgety/restless 0 0 0  Suicidal thoughts 0 0 0  PHQ-9 Score 3 0 0  Difficult doing work/chores Not difficult at all Not difficult at all Not difficult at all    BP Readings from Last 3 Encounters:  02/23/22 138/70  09/17/21 130/78  08/26/21 138/70    Physical Exam Vitals and nursing note reviewed.  HENT:     Right Ear: Tympanic membrane normal.     Left Ear: Tympanic membrane normal.     Nose: Nose normal.     Mouth/Throat:     Lips: Pink.     Mouth: Mucous membranes are moist.     Tongue: No lesions.  Eyes:     Conjunctiva/sclera: Conjunctivae normal.  Cardiovascular:     Heart sounds: S1 normal and S2 normal. No murmur heard.    No systolic murmur is present.     No diastolic murmur is present.     No S3 or S4 sounds.  Abdominal:     General: Bowel sounds are normal.     Palpations: There is no hepatomegaly or splenomegaly.     Tenderness: There is no abdominal tenderness.  Genitourinary:    Rectum: Guaiac result positive. No mass, tenderness, anal fissure or external  hemorrhoid.     Wt Readings from Last 3 Encounters:  02/23/22 292 lb (132.5 kg)  09/17/21 287 lb (130.2 kg)  08/26/21 287 lb (130.2 kg)    BP 138/70   Pulse 80   Ht 5' 10"  (1.778 m)   Wt 292 lb (132.5 kg)   BMI 41.90 kg/m   Assessment and Plan: 1. Lower GI bleed New onset.  Resolving.  Currently stable.  Following an episode of constipation which was painful at that time  patient developed bright red blood per rectum.  This was noted on the tissue and in the water.  Patient has been having decreased bleeding over the course of the last 48 hours and we obtained a CBC an immediate basis.  Upon evaluation hemoglobin was noted to be 13.8 which is similar to 5 months ago with a hemoglobin of 14.1.  Patient does not demonstrate any postural dizziness nor has had clots pass.  At this point patient seems to be resolving from an episode constipation and may be had a small fissure which is inside.  There is some discomfort on DRE and it was guaiac positive with flecks of blood that was seen but no significant amount of blood on return.  Review of most recent colonoscopy is unremarkable except for 1 polyp and she is to return in 5 to 10 months for recheck colonoscopy in the future.  2. Constipation, unspecified constipation type Chronic.  Controlled.  Stable.  Patient is currently on a stool softener and is doing well.  We have also encouraged her to take more fiber in her diet at this time.  If bleeding continues then we may need to [facility] her gastroenterologist at [facility].  3. Mild intermittent reactive airway disease with acute exacerbation Chronic.  Controlled.  Stable.  Continue Singulair 10 mg once a day. - montelukast (SINGULAIR) 10 MG tablet; Take 1 tablet (10 mg total) by mouth at bedtime.  Dispense: 90 tablet; Refill: 1  4. Venous stasis Patient has noted more venous stasis coloration of 1 leg and then the other and would like to be evaluated by  vein and vascular for venous  insufficiency. - Ambulatory referral to Vascular Surgery  5. Need for immunization against influenza Discussed and administered. - Flu Vaccine QUAD 51moIM (Fluarix, Fluzone & Alfiuria Quad PF)     DOtilio Miu MD

## 2022-03-19 DIAGNOSIS — I872 Venous insufficiency (chronic) (peripheral): Secondary | ICD-10-CM | POA: Insufficient documentation

## 2022-03-19 NOTE — Progress Notes (Signed)
MRN : [id number]  Theresa Best is a 61 y.o. (1960/07/13) female who presents with chief complaint of legs hurt and swell.  History of Present Illness:   Patient is seen for evaluation of leg swelling. The patient first noticed the swelling remotely but is now concerned because of a significant increase in the overall edema. The swelling isn't associated with significant pain.  There has been an increasing amount of  discoloration noted by the patient. The patient notes that in the morning the legs are improved but they steadily worsened throughout the course of the day. Elevation seems to make the swelling of the legs better, dependency makes them much worse.   There is no history of ulcerations associated with the swelling.   The patient denies any recent changes in their medications.  The patient has not been wearing graduated compression.  The patient has no had any past angiography, interventions or vascular surgery.  The patient denies a history of DVT or PE. There is no prior history of phlebitis. There is no history of primary lymphedema.  There is no history of radiation treatment to the groin or pelvis No history of malignancies. No history of trauma or groin or pelvic surgery. No history of foreign travel or parasitic infections area    No outpatient medications have been marked as taking for the 03/21/22 encounter (Appointment) with Delana Meyer, Dolores Lory, MD.    Past Medical History:  Diagnosis Date   Hypertension     No past surgical history on file.  Social History Social History   Tobacco Use   Smoking status: Never   Smokeless tobacco: Never  Substance Use Topics   Alcohol use: No    Alcohol/week: 0.0 standard drinks of alcohol   Drug use: No    Family History Family History  Problem Relation Age of Onset   Breast cancer Neg Hx     No Known Allergies   REVIEW OF SYSTEMS (Negative unless checked)  Constitutional: [] Weight loss   [] Fever  [] Chills Cardiac: [] Chest pain   [] Chest pressure   [] Palpitations   [] Shortness of breath when laying flat   [] Shortness of breath with exertion. Vascular:  [] Pain in legs with walking   [x] Pain in legs at rest  [] History of DVT   [] Phlebitis   [x] Swelling in legs   [] Varicose veins   [] Non-healing ulcers Pulmonary:   [] Uses home oxygen   [] Productive cough   [] Hemoptysis   [] Wheeze  [] COPD   [] Asthma Neurologic:  [] Dizziness   [] Seizures   [] History of stroke   [] History of TIA  [] Aphasia   [] Vissual changes   [] Weakness or numbness in arm   [] Weakness or numbness in leg Musculoskeletal:   [] Joint swelling   [] Joint pain   [] Low back pain Hematologic:  [] Easy bruising  [] Easy bleeding   [] Hypercoagulable state   [] Anemic Gastrointestinal:  [] Diarrhea   [] Vomiting  [] Gastroesophageal reflux/heartburn   [] Difficulty swallowing. Genitourinary:  [] Chronic kidney disease   [] Difficult urination  [] Frequent urination   [] Blood in urine Skin:  [] Rashes   [] Ulcers  Psychological:  [] History of anxiety   []  History of major depression.  Physical Examination  There were no vitals filed for this visit. There is no height or weight on file to calculate BMI. Gen: WD/WN, NAD Head: [place]/AT, No temporalis wasting.  Ear/Nose/Throat: Hearing grossly intact, nares w/o erythema or drainage, pinna without lesions Eyes: PER, EOMI, sclera nonicteric.  Neck: Supple, no gross masses.  No JVD.  Pulmonary:  Good air movement, no audible wheezing, no use of accessory muscles.  Cardiac: RRR, precordium not hyperdynamic. Vascular:   Varicosities present bilaterally tender to palpation.  Moderate venous stasis changes to the legs bilaterally.  2+ soft pitting edema  Vessel Right Left  Radial Palpable Palpable  Gastrointestinal: soft, non-distended. No guarding/no peritoneal signs.  Musculoskeletal: M/S 5/5 throughout.  No deformity.  Neurologic: CN 2-12 intact. Pain and light touch intact in extremities.   Symmetrical.  Speech is fluent. Motor exam as listed above. Psychiatric: Judgment intact, Mood & affect appropriate for pt's clinical situation. Dermatologic: Venous rashes no ulcers noted.  No changes consistent with cellulitis. Lymph : No lichenification or skin changes of chronic lymphedema.  CBC Lab Results  Component Value Date   WBC 8.2 02/23/2022   HGB 13.8 02/23/2022   HCT 42.3 02/23/2022   MCV 90.6 02/23/2022   PLT 328 02/23/2022    BMET    Component Value Date/Time   NA 138 09/17/2021 0957   K 4.4 09/17/2021 0957   CL 100 09/17/2021 0957   CO2 23 09/17/2021 0957   GLUCOSE 99 09/17/2021 0957   BUN 14 09/17/2021 0957   CREATININE 0.53 (L) 09/17/2021 0957   CALCIUM 9.9 09/17/2021 0957   GFRNONAA 97 05/08/2020 1648   GFRAA 112 05/08/2020 1648   CrCl cannot be calculated (Patient's most recent lab result is older than the maximum 21 days allowed.).  COAG No results found for: "INR", "PROTIME"  Radiology No results found.   Assessment/Plan 1. Chronic venous insufficiency  Recommend:  The patient has large symptomatic varicose veins that are painful and associated with swelling  and are associated with moderate to severe venous insufficiency.  I have had a long discussion with the patient regarding  varicose veins and why they cause symptoms.  Patient will begin wearing graduated compression stockings class 1 on a daily basis, beginning first thing in the morning and removing them in the evening. The patient is instructed specifically not to sleep in the stockings.    The patient  will also begin using over-the-counter analgesics such as Motrin 600 mg po TID to help control the symptoms.    In addition, behavioral modification including elevation during the day will be initiated.    Pending the results of these changes the  patient will be reevaluated in three months.   An ultrasound of the venous system will be obtained.   Further plans will be based on the  ultrasound results and whether conservative therapies are successful at eliminating the pain and swelling.   - VAS [place] LOWER EXTREMITY VENOUS REFLUX; Future  2. Essential hypertension Continue antihypertensive medications as already ordered, these medications have been reviewed and there are no changes at this time.   3. Familial hypercholesterolemia Continue statin as ordered and reviewed, no changes at this time     Levora Dredge, MD  03/19/2022 4:17 PM

## 2022-03-21 ENCOUNTER — Ambulatory Visit (INDEPENDENT_AMBULATORY_CARE_PROVIDER_SITE_OTHER): Payer: BC Managed Care – PPO | Admitting: Vascular Surgery

## 2022-03-21 ENCOUNTER — Encounter (INDEPENDENT_AMBULATORY_CARE_PROVIDER_SITE_OTHER): Payer: Self-pay | Admitting: Vascular Surgery

## 2022-03-21 VITALS — BP 168/83 | HR 105 | Resp 18 | Ht 67.0 in | Wt 296.6 lb

## 2022-03-21 DIAGNOSIS — I872 Venous insufficiency (chronic) (peripheral): Secondary | ICD-10-CM | POA: Diagnosis not present

## 2022-03-21 DIAGNOSIS — I10 Essential (primary) hypertension: Secondary | ICD-10-CM

## 2022-03-21 DIAGNOSIS — E7801 Familial hypercholesterolemia: Secondary | ICD-10-CM | POA: Diagnosis not present

## 2022-03-27 ENCOUNTER — Encounter (INDEPENDENT_AMBULATORY_CARE_PROVIDER_SITE_OTHER): Payer: Self-pay | Admitting: Vascular Surgery

## 2022-04-05 ENCOUNTER — Telehealth: Payer: Self-pay | Admitting: Family Medicine

## 2022-04-05 ENCOUNTER — Encounter: Payer: Self-pay | Admitting: Family Medicine

## 2022-04-05 ENCOUNTER — Ambulatory Visit
Admission: RE | Admit: 2022-04-05 | Discharge: 2022-04-05 | Disposition: A | Discharge status: Home / Self Care | Payer: BC Managed Care – PPO | Attending: Family Medicine | Admitting: Family Medicine

## 2022-04-05 ENCOUNTER — Ambulatory Visit (INDEPENDENT_AMBULATORY_CARE_PROVIDER_SITE_OTHER): Payer: BC Managed Care – PPO | Admitting: Family Medicine

## 2022-04-05 ENCOUNTER — Ambulatory Visit
Admission: RE | Admit: 2022-04-05 | Discharge: 2022-04-05 | Disposition: A | Discharge status: Home / Self Care | Payer: BC Managed Care – PPO | Source: Ambulatory Visit | Attending: Family Medicine | Admitting: Family Medicine

## 2022-04-05 VITALS — BP 138/98 | HR 100 | Ht 67.0 in | Wt 300.0 lb

## 2022-04-05 DIAGNOSIS — J4521 Mild intermittent asthma with (acute) exacerbation: Secondary | ICD-10-CM

## 2022-04-05 DIAGNOSIS — J209 Acute bronchitis, unspecified: Secondary | ICD-10-CM | POA: Insufficient documentation

## 2022-04-05 DIAGNOSIS — J45901 Unspecified asthma with (acute) exacerbation: Secondary | ICD-10-CM | POA: Insufficient documentation

## 2022-04-05 DIAGNOSIS — R051 Acute cough: Secondary | ICD-10-CM | POA: Insufficient documentation

## 2022-04-05 MED ORDER — PREDNISONE 10 MG PO TABS: 10.0000 mg | ORAL_TABLET | Freq: Every day | ORAL | 0 refills | Status: DC

## 2022-04-05 MED ORDER — AZITHROMYCIN 250 MG PO TABS: ORAL_TABLET | ORAL | 0 refills | Status: AC

## 2022-04-05 MED ORDER — GUAIFENESIN-CODEINE 100-10 MG/5ML PO SYRP: 5.0000 mL | ORAL_SOLUTION | Freq: Three times a day (TID) | ORAL | 0 refills | Status: DC | PRN

## 2022-04-05 NOTE — Telephone Encounter (Signed)
Pt on schedule today at 4pm.

## 2022-04-05 NOTE — Telephone Encounter (Signed)
Copied from [phone]. Topic: General - Inquiry >> Apr 05, 2022  3:48 PM Devoria Glassing wrote: Reason for CRM: pt wants Baxter Flattery to call her asap.  She is next door at ENT and wants to see Dr Ronnald Ramp

## 2022-04-05 NOTE — Progress Notes (Signed)
Date:  04/05/2022   Name:  Theresa Best   DOB:  January 19, 1961   MRN:  [id number]   Chief Complaint: Cough (Green [place])  Cough This is a recurrent problem. The current episode started more than 1 year ago (worse 4 weeeks). The problem occurs constantly. The cough is Non-productive. Associated symptoms include shortness of breath and sweats. Pertinent negatives include no chest pain, chills, ear pain, fever, headaches, hemoptysis, myalgias, nasal congestion, postnasal drip, rash, sore throat or wheezing. She has tried a beta-agonist inhaler for the symptoms. The treatment provided mild relief. Her past medical history is significant for asthma. There is no history of environmental allergies.    Lab Results  Component Value Date   NA 138 09/17/2021   K 4.4 09/17/2021   CO2 23 09/17/2021   GLUCOSE 99 09/17/2021   BUN 14 09/17/2021   CREATININE 0.53 (L) 09/17/2021   CALCIUM 9.9 09/17/2021   EGFR 106 09/17/2021   GFRNONAA 97 05/08/2020   Lab Results  Component Value Date   CHOL 263 (H) 09/17/2021   HDL 53 09/17/2021   LDLCALC 189 (H) 09/17/2021   TRIG 119 09/17/2021   CHOLHDL 5.8 (H) 01/29/2021   No results found for: "TSH" No results found for: "HGBA1C" Lab Results  Component Value Date   WBC 8.2 02/23/2022   HGB 13.8 02/23/2022   HCT 42.3 02/23/2022   MCV 90.6 02/23/2022   PLT 328 02/23/2022   No results found for: "ALT", "AST", "GGT", "ALKPHOS", "BILITOT" No results found for: "25OHVITD2", "25OHVITD3", "VD25OH"   Review of Systems  Constitutional:  Negative for chills and fever.  HENT:  Negative for drooling, ear discharge, ear pain, postnasal drip and sore throat.   Respiratory:  Positive for cough and shortness of breath. Negative for hemoptysis and wheezing.   Cardiovascular:  Negative for chest pain, palpitations and leg swelling.  Gastrointestinal:  Negative for abdominal pain, blood in stool, constipation, diarrhea and nausea.  Endocrine: Negative for  polydipsia.  Genitourinary:  Negative for dysuria, frequency, hematuria and urgency.  Musculoskeletal:  Negative for back pain, myalgias and neck pain.  Skin:  Negative for rash.  Allergic/Immunologic: Negative for environmental allergies.  Neurological:  Negative for dizziness and headaches.  Hematological:  Does not bruise/bleed easily.  Psychiatric/Behavioral:  Negative for suicidal ideas. The patient is not nervous/anxious.     Patient Active Problem List   Diagnosis Date Noted   Chronic venous insufficiency 03/19/2022   Varicose veins of right lower extremity with pain 07/22/2019   Familial hypercholesterolemia 07/22/2019   Uterine prolapse 08/16/2018   SUI (stress urinary incontinence, female) 08/16/2018   POP-Q stage 2 cystocele 08/16/2018   Essential hypertension 11/03/2016   Class 3 severe obesity due to excess calories with serious comorbidity and body mass index (BMI) of 40.0 to 44.9 in adult [facility]) 11/03/2016    No Known Allergies  No past surgical history on file.  Social History   Tobacco Use   Smoking status: Never   Smokeless tobacco: Never  Substance Use Topics   Alcohol use: No    Alcohol/week: 0.0 standard drinks of alcohol   Drug use: No     Medication list has been reviewed and updated.  Current Meds  Medication Sig   albuterol (PROVENTIL) (2.5 MG/3ML) 0.083% nebulizer solution Take 3 mLs (2.5 mg total) by nebulization every 6 (six) hours as needed for wheezing or shortness of breath.   albuterol (VENTOLIN HFA) 108 (90 Base) MCG/ACT inhaler Inhale 2  puffs into the lungs every 6 (six) hours as needed for wheezing or shortness of breath.   amLODipine (NORVASC) 5 MG tablet Take 1 tablet (5 mg total) by mouth daily.   atorvastatin (LIPITOR) 10 MG tablet Take 1 tablet (10 mg total) by mouth daily.   Azelaic Acid 15 % gel Apply topically every morning.   budesonide-formoterol (SYMBICORT) 80-4.5 MCG/ACT inhaler Inhale 2 puffs into the lungs 2 (two) times  daily.   hydrochlorothiazide (HYDRODIURIL) 12.5 MG tablet Take 1 tablet (12.5 mg total) by mouth daily.   hydrocortisone 2.5 % ointment Apply topically 2 (two) times daily.   montelukast (SINGULAIR) 10 MG tablet Take 1 tablet (10 mg total) by mouth at bedtime.   tretinoin (RETIN-A) 0.05 % cream Apply topically at bedtime.       04/05/2022    4:28 PM 02/23/2022   11:07 AM 09/17/2021    8:51 AM 01/29/2021    8:10 AM  GAD 7 : Generalized Anxiety Score  Nervous, Anxious, on Edge 0 0 0 0  Control/stop worrying 0 1 0 0  Worry too much - different things 0 1 0 0  Trouble relaxing 0 0 0 0  Restless 0 0 0 0  Easily annoyed or irritable 0 0 0 0  Afraid - awful might happen 0 0 0 0  Total GAD 7 Score 0 2 0 0  Anxiety Difficulty Not difficult at all Not difficult at all Not difficult at all        04/05/2022    4:28 PM 02/23/2022   11:06 AM 09/17/2021    8:51 AM  Depression screen PHQ 2/9  Decreased Interest 0 1 0  Down, Depressed, Hopeless 0 0 0  PHQ - 2 Score 0 1 0  Altered sleeping 0 0 0  Tired, decreased energy 0 1 0  Change in appetite 0 1 0  Feeling bad or failure about yourself  0 0 0  Trouble concentrating 0 0 0  Moving slowly or fidgety/restless 0 0 0  Suicidal thoughts 0 0 0  PHQ-9 Score 0 3 0  Difficult doing work/chores Not difficult at all Not difficult at all Not difficult at all    BP Readings from Last 3 Encounters:  04/05/22 (!) 138/98  03/21/22 (!) 168/83  02/23/22 138/70    Physical Exam Vitals and nursing note reviewed.  Constitutional:      Appearance: She is well-developed.  HENT:     Head: Normocephalic.     Right Ear: External ear normal.     Left Ear: External ear normal.  Eyes:     General: Lids are everted, no foreign bodies appreciated. No scleral icterus.       Left eye: No foreign body or hordeolum.     Conjunctiva/sclera: Conjunctivae normal.     Right eye: Right conjunctiva is not injected.     Left eye: Left conjunctiva is not injected.      Pupils: Pupils are equal, round, and reactive to light.  Neck:     Thyroid: No thyromegaly.     Vascular: No JVD.     Trachea: No tracheal deviation.  Cardiovascular:     Rate and Rhythm: Normal rate and regular rhythm.     Heart sounds: Normal heart sounds, S1 normal and S2 normal. No murmur heard.    No systolic murmur is present.     No diastolic murmur is present.     No friction rub. No gallop. No S3 or S4  sounds.  Pulmonary:     Effort: Pulmonary effort is normal. No respiratory distress.     Breath sounds: No stridor. Wheezing present. No rhonchi or rales.  Abdominal:     General: Bowel sounds are normal.     Palpations: Abdomen is soft. There is no mass.     Tenderness: There is no abdominal tenderness. There is no guarding or rebound.  Musculoskeletal:        General: No tenderness. Normal range of motion.     Cervical back: Normal range of motion and neck supple.  Lymphadenopathy:     Cervical: No cervical adenopathy.  Skin:    General: Skin is warm.     Findings: No rash.  Neurological:     Mental Status: She is alert and oriented to person, place, and time.     Cranial Nerves: No cranial nerve deficit.     Deep Tendon Reflexes: Reflexes normal.  Psychiatric:        Mood and Affect: Mood is not anxious or depressed.     Wt Readings from Last 3 Encounters:  04/05/22 300 lb (136.1 kg)  03/21/22 296 lb 9.6 oz (134.5 kg)  02/23/22 292 lb (132.5 kg)    BP (!) 138/98   Pulse 100   Ht 5' 7"  (1.702 m)   Wt 300 lb (136.1 kg)   BMI 46.99 kg/m   Assessment and Plan:  1. Mild intermittent reactive airway disease with acute exacerbation Chronic.  Controlled.  Stable.  New onset of persistent cough with wheezing and mild shortness of breath.  Probably combination of exposure to kids with viruses, exacerbation of the pollen/ragweed leaf mold.  We will treat with azithromycin prednisone 10 mg once a day and will obtain chest x-ray.  Patient has been given a sample of  Breztri and is to take this 1 puff twice a day. - azithromycin (ZITHROMAX) 250 MG tablet; Take 2 tablets on day 1, then 1 tablet daily on days 2 through 5  Dispense: 6 tablet; Refill: 0 - predniSONE (DELTASONE) 10 MG tablet; Take 1 tablet (10 mg total) by mouth daily with breakfast.  Dispense: 30 tablet; Refill: 0 - DG Chest 2 View  2. Acute cough Persistent cough despite using albuterol inhaler this will be continued in addition to above patient has been given Robitussin with Codeine. - azithromycin (ZITHROMAX) 250 MG tablet; Take 2 tablets on day 1, then 1 tablet daily on days 2 through 5  Dispense: 6 tablet; Refill: 0 - predniSONE (DELTASONE) 10 MG tablet; Take 1 tablet (10 mg total) by mouth daily with breakfast.  Dispense: 30 tablet; Refill: 0 - DG Chest 2 View  3. Acute bronchitis with asthma with acute exacerbation Acute bronchitis which is nonproductive been going on for 4 weeks and we will initiate azithromycin with prednisone.   Otilio Miu, MD

## 2022-04-18 ENCOUNTER — Encounter (INDEPENDENT_AMBULATORY_CARE_PROVIDER_SITE_OTHER): Payer: Self-pay

## 2022-05-20 ENCOUNTER — Other Ambulatory Visit: Payer: Self-pay | Admitting: Family Medicine

## 2022-05-20 DIAGNOSIS — E782 Mixed hyperlipidemia: Secondary | ICD-10-CM

## 2022-05-20 DIAGNOSIS — I10 Essential (primary) hypertension: Secondary | ICD-10-CM

## 2022-05-20 MED ORDER — ATORVASTATIN CALCIUM 10 MG PO TABS: 10.0000 mg | ORAL_TABLET | Freq: Every day | ORAL | 0 refills | Status: DC

## 2022-05-20 NOTE — Telephone Encounter (Signed)
Medication Refill - Medication:  amLODipine (NORVASC) 5 MG tablet  hydrochlorothiazide (HYDRODIURIL) 12.5 MG tablet  atorvastatin (LIPITOR) 10 MG tablet   Has the patient contacted their pharmacy? Yes.     Preferred Pharmacy (with phone number or street name):  [facility] [address] [place] - [address] [place] [address] Phone: [phone]  Fax: [phone]   Has the patient been seen for an appointment in the last year OR does the patient have an upcoming appointment? Yes.    Agent: Please be advised that RX refills may take up to 3 business days. We ask that you follow-up with your pharmacy.

## 2022-05-20 NOTE — Telephone Encounter (Signed)
Requested Prescriptions  Pending Prescriptions Disp Refills   amLODipine (NORVASC) 5 MG tablet [Pharmacy Med Name: amLODIPine Besylate 5 MG Oral Tablet] 90 tablet 0    Sig: Take 1 tablet by mouth once daily     Cardiovascular: Calcium Channel Blockers 2 Failed - 05/20/2022 11:48 AM      Failed - Last BP in normal range    BP Readings from Last 1 Encounters:  04/05/22 (!) 138/98         Passed - Last Heart Rate in normal range    Pulse Readings from Last 1 Encounters:  04/05/22 100         Passed - Valid encounter within last 6 months    Recent Outpatient Visits           1 month ago Mild intermittent reactive airway disease with acute exacerbation   [facility] [place], Deanna C, MD   2 months ago Lower GI bleed   [place] [place], Deanna C, MD   8 months ago Annual physical exam   [facility] [place], Deanna C, MD   8 months ago Acute maxillary sinusitis, recurrence not specified   [facility] [facility] [place], Deanna C, MD   10 months ago [facility], MD               hydrochlorothiazide (HYDRODIURIL) 12.5 MG tablet [Pharmacy Med Name: hydroCHLOROthiazide 12.5 MG Oral Tablet] 90 tablet 0    Sig: Take 1 tablet by mouth once daily     Cardiovascular: Diuretics - Thiazide Failed - 05/20/2022 11:48 AM      Failed - Cr in normal range and within 180 days    Creatinine, Ser  Date Value Ref Range Status  09/17/2021 0.53 (L) 0.57 - 1.00 mg/dL Final         Failed - K in normal range and within 180 days    Potassium  Date Value Ref Range Status  09/17/2021 4.4 3.5 - 5.2 mmol/L Final         Failed - Na in normal range and within 180 days    Sodium  Date Value Ref Range Status  09/17/2021 138 134 -  144 mmol/L Final         Failed - Last BP in normal range    BP Readings from Last 1 Encounters:  04/05/22 (!) 138/98         Passed - Valid encounter within last 6 months    Recent Outpatient Visits           1 month ago Mild intermittent reactive airway disease with acute exacerbation   [facility] [place], Deanna C, MD   2 months ago Lower GI bleed   [place] [place], Deanna C, MD   8 months ago Annual physical exam   [facility] [place], Deanna C, MD   8 months ago Acute maxillary sinusitis, recurrence not specified   [facility] [place], Deanna C, MD   10 months ago [place] [facility], [facility]               atorvastatin (LIPITOR) 10 MG tablet 90 tablet 0    Sig: Take 1 tablet (10 mg total) by mouth daily.     Cardiovascular:  Antilipid - Statins Failed - 05/20/2022 11:48 AM      Failed - Lipid Panel in normal range within the last 12 months    Cholesterol, Total  Date Value Ref Range Status  09/17/2021 263 (H) 100 - 199 mg/dL Final   LDL Chol Calc (NIH)  Date Value Ref Range Status  09/17/2021 189 (H) 0 - 99 mg/dL Final   HDL  Date Value Ref Range Status  09/17/2021 53 >39 mg/dL Final   Triglycerides  Date Value Ref Range Status  09/17/2021 119 0 - 149 mg/dL Final         Passed - Patient is not pregnant      Passed - Valid encounter within last 12 months    Recent Outpatient Visits           1 month ago Mild intermittent reactive airway disease with acute exacerbation   [facility], MD   2 months ago Lower GI bleed   [place] [facility], MD   8 months ago  Annual physical exam   [facility], MD   8 months ago Acute maxillary sinusitis, recurrence not specified   [facility] [facility], MD   10 months ago COVID   [facility], MD

## 2022-06-16 ENCOUNTER — Telehealth (INDEPENDENT_AMBULATORY_CARE_PROVIDER_SITE_OTHER): Payer: Self-pay | Admitting: Vascular Surgery

## 2022-06-16 NOTE — Telephone Encounter (Signed)
Pt LVMOM asking if her next appt would be covered by insurance. I attempted to call pt back but no answer and no VM set up. Pt can be advised that she would need to call her insurance but more it seems that it would be covered but would depend on how much. Nothing further needed at this time.

## 2022-06-29 ENCOUNTER — Other Ambulatory Visit (INDEPENDENT_AMBULATORY_CARE_PROVIDER_SITE_OTHER): Payer: Self-pay | Admitting: Vascular Surgery

## 2022-06-29 DIAGNOSIS — I872 Venous insufficiency (chronic) (peripheral): Secondary | ICD-10-CM

## 2022-06-30 DIAGNOSIS — I890 Lymphedema, not elsewhere classified: Secondary | ICD-10-CM | POA: Insufficient documentation

## 2022-06-30 NOTE — Progress Notes (Signed)
MRN : [id number]  Theresa Best is a 62 y.o. ([birth year]) female who presents with chief complaint of legs swell.  History of Present Illness:  The patient returns for followup evaluation 3 months (12 weeks) after the initial visit. The patient continues to have pain in the lower extremities with dependency. The pain is lessened with elevation. Graduated compression stockings, Class I (20-30 mmHg), have been worn but the stockings do not eliminate the leg pain. Over-the-counter analgesics do not improve the symptoms. The degree of discomfort continues to interfere with daily activities. The patient notes the pain in the legs is causing problems with daily exercise, at the workplace and even with household activities and maintenance such as standing in the kitchen preparing meals and doing dishes.  She also notes her swelling has persisted and the pain associated with swelling continues. There have not been any interval development of a ulcerations or wounds.  Since the previous visit the patient has been wearing graduated compression stockings and has noted little if any improvement in the lymphedema. Again the patient has been using compression routinely morning until night.  Venous ultrasound shows normal deep venous system, no evidence of acute or chronic DVT.  Superficial reflux is present in the right great saphenous vein, Baker's Cyst noted on the left.  No outpatient medications have been marked as taking for the 07/04/22 encounter (Appointment) with Gilda Crease, Latina Craver, MD.    Past Medical History:  Diagnosis Date   Hypertension     No past surgical history on file.  Social History Social History   Tobacco Use   Smoking status: Never   Smokeless tobacco: Never  Substance Use Topics   Alcohol use: No    Alcohol/week: 0.0 standard drinks of alcohol   Drug use: No    Family History Family History  Problem Relation Age of Onset   Breast cancer Neg Hx     No Known  Allergies   REVIEW OF SYSTEMS (Negative unless checked)  Constitutional: [] Weight loss  [] Fever  [] Chills Cardiac: [] Chest pain   [] Chest pressure   [] Palpitations   [] Shortness of breath when laying flat   [] Shortness of breath with exertion. Vascular:  [] Pain in legs with walking   [x] Pain in legs with standing  [] History of DVT   [] Phlebitis   [x] Swelling in legs   [] Varicose veins   [] Non-healing ulcers Pulmonary:   [] Uses home oxygen   [] Productive cough   [] Hemoptysis   [] Wheeze  [] COPD   [] Asthma Neurologic:  [] Dizziness   [] Seizures   [] History of stroke   [] History of TIA  [] Aphasia   [] Vissual changes   [] Weakness or numbness in arm   [] Weakness or numbness in leg Musculoskeletal:   [] Joint swelling   [] Joint pain   [] Low back pain Hematologic:  [] Easy bruising  [] Easy bleeding   [] Hypercoagulable state   [] Anemic Gastrointestinal:  [] Diarrhea   [] Vomiting  [] Gastroesophageal reflux/heartburn   [] Difficulty swallowing. Genitourinary:  [] Chronic kidney disease   [] Difficult urination  [] Frequent urination   [] Blood in urine Skin:  [] Rashes   [] Ulcers  Psychological:  [] History of anxiety   []  History of major depression.  Physical Examination  There were no vitals filed for this visit. There is no height or weight on file to calculate BMI. Gen: WD/WN, NAD Head: [place]/AT, No temporalis wasting.  Ear/Nose/Throat: Hearing grossly intact, nares w/o erythema or drainage, pinna without lesions Eyes: PER, EOMI, sclera nonicteric.  Neck: Supple, no gross  masses.  No JVD.  Pulmonary:  Good air movement, no audible wheezing, no use of accessory muscles.  Cardiac: RRR, precordium not hyperdynamic. Vascular:  scattered varicosities present bilaterally.  Mild venous stasis changes to the legs bilaterally.  3-4+ soft pitting edema, CEAP C4sEpAsPr  Vessel Right Left  Radial Palpable Palpable  Gastrointestinal: soft, non-distended. No guarding/no peritoneal signs.  Musculoskeletal: M/S 5/5  throughout.  No deformity.  Neurologic: CN 2-12 intact. Pain and light touch intact in extremities.  Symmetrical.  Speech is fluent. Motor exam as listed above. Psychiatric: Judgment intact, Mood & affect appropriate for pt's clinical situation. Dermatologic: Venous rashes no ulcers noted.  No changes consistent with cellulitis. Lymph : No lichenification or skin changes of chronic lymphedema.  CBC Lab Results  Component Value Date   WBC 8.2 02/23/2022   HGB 13.8 02/23/2022   HCT 42.3 02/23/2022   MCV 90.6 02/23/2022   PLT 328 02/23/2022    BMET    Component Value Date/Time   NA 138 09/17/2021 0957   K 4.4 09/17/2021 0957   CL 100 09/17/2021 0957   CO2 23 09/17/2021 0957   GLUCOSE 99 09/17/2021 0957   BUN 14 09/17/2021 0957   CREATININE 0.53 (L) 09/17/2021 0957   CALCIUM 9.9 09/17/2021 0957   GFRNONAA 97 05/08/2020 1648   GFRAA 112 05/08/2020 1648   CrCl cannot be calculated (Patient's most recent lab result is older than the maximum 21 days allowed.).  COAG No results found for: "INR", "PROTIME"  Radiology No results found.   Assessment/Plan 1. Varicose veins of right lower extremity with pain Recommend  I have reviewed my previous  discussion with the patient regarding  varicose veins and why they cause symptoms. Patient will continue  wearing graduated compression stockings class 1 on a daily basis, beginning first thing in the morning and removing them in the evening.  The patient is CEAP C3sEpAsPr.  The patient has been wearing compression for more than 12 weeks with no or little benefit.  The patient has been exercising daily for more than 12 weeks. The patient has been elevating and taking OTC pain medications for more than 12 weeks.  None of these have have eliminated the pain related to the varicose veins and venous reflux or the discomfort regarding venous congestion.    In addition, behavioral modification including elevation during the day was again  discussed and this will continue.  The patient has utilized over the counter pain medications and has been exercising.  However, at this time conservative therapy has not alleviated the patient's symptoms of leg pain and swelling  Recommend: laser ablation of the right great saphenous veins to eliminate the symptoms of pain and swelling of the lower extremities caused by the severe superficial venous reflux disease.   2. Lymphedema Recommend:  No surgery or intervention at this point in time.   The Patient is CEAP C4sEpAsPr.  The patient has been wearing compression for more than 12 weeks with no or little benefit.  The patient has been exercising daily for more than 12 weeks. The patient has been elevating and taking OTC pain medications for more than 12 weeks.  None of these have have eliminated the pain related to the lymphedema or the discomfort regarding excessive swelling and venous congestion.    I have reviewed my discussion with the patient regarding lymphedema and why it  causes symptoms.  Patient will continue wearing graduated compression on a daily basis. The patient should put the  compression on first thing in the morning and removing them in the evening. The patient should not sleep in the compression.   In addition, behavioral modification throughout the day will be continued.  This will include frequent elevation (such as in a recliner), use of over the counter pain medications as needed and exercise such as walking.  The systemic causes for chronic edema such as liver, kidney and cardiac etiologies do not appear to have significant changed over the past year.    The patient has chronic , severe lymphedema bilaterally with hyperpigmentation of the skin and has done MLD, skin care, medication, diet, exercise, elevation and compression for 4 weeks with no improvement,  I believe a lymphedema pump will help but wish to treat her painful varicose veins of the right leg first.  The  patient still has stage 3 lymphedema and therefore, I believe that a lymph pump will be helpful to improve the control of the patient's bilateral lymphedema and improve the quality of life.    3. Chronic venous insufficiency See # 1& 2  4. Baker's cyst of knee, left We discussed this and she will follow up with orthopedics as needed  5. Essential hypertension Continue antihypertensive medications as already ordered, these medications have been reviewed and there are no changes at this time.  6. Familial hypercholesterolemia Continue statin as ordered and reviewed, no changes at this time    Hortencia Pilar, MD  06/30/2022 10:19 AM

## 2022-07-04 ENCOUNTER — Ambulatory Visit (INDEPENDENT_AMBULATORY_CARE_PROVIDER_SITE_OTHER): Payer: BC Managed Care – PPO | Admitting: Vascular Surgery

## 2022-07-04 ENCOUNTER — Encounter (INDEPENDENT_AMBULATORY_CARE_PROVIDER_SITE_OTHER): Payer: Self-pay | Admitting: Vascular Surgery

## 2022-07-04 ENCOUNTER — Ambulatory Visit (INDEPENDENT_AMBULATORY_CARE_PROVIDER_SITE_OTHER): Payer: BC Managed Care – PPO

## 2022-07-04 VITALS — BP 174/97 | HR 73 | Resp 16 | Wt 299.0 lb

## 2022-07-04 DIAGNOSIS — M7122 Synovial cyst of popliteal space [Baker], left knee: Secondary | ICD-10-CM

## 2022-07-04 DIAGNOSIS — I890 Lymphedema, not elsewhere classified: Secondary | ICD-10-CM

## 2022-07-04 DIAGNOSIS — I83811 Varicose veins of right lower extremities with pain: Secondary | ICD-10-CM | POA: Diagnosis not present

## 2022-07-04 DIAGNOSIS — I10 Essential (primary) hypertension: Secondary | ICD-10-CM

## 2022-07-04 DIAGNOSIS — I872 Venous insufficiency (chronic) (peripheral): Secondary | ICD-10-CM

## 2022-07-04 DIAGNOSIS — E7801 Familial hypercholesterolemia: Secondary | ICD-10-CM

## 2022-07-06 ENCOUNTER — Encounter: Payer: Self-pay | Admitting: Family Medicine

## 2022-07-06 ENCOUNTER — Ambulatory Visit (INDEPENDENT_AMBULATORY_CARE_PROVIDER_SITE_OTHER): Payer: BC Managed Care – PPO | Admitting: Family Medicine

## 2022-07-06 ENCOUNTER — Telehealth: Payer: Self-pay | Admitting: Family Medicine

## 2022-07-06 VITALS — BP 129/76 | HR 105 | Temp 98.6°F | Ht 67.0 in | Wt 298.0 lb

## 2022-07-06 DIAGNOSIS — R051 Acute cough: Secondary | ICD-10-CM

## 2022-07-06 DIAGNOSIS — J4521 Mild intermittent asthma with (acute) exacerbation: Secondary | ICD-10-CM

## 2022-07-06 DIAGNOSIS — U071 COVID-19: Secondary | ICD-10-CM | POA: Diagnosis not present

## 2022-07-06 LAB — POC COVID19 BINAXNOW: SARS Coronavirus 2 Ag: POSITIVE — AB

## 2022-07-06 MED ORDER — PROMETHAZINE-DM 6.25-15 MG/5ML PO SYRP: 5.0000 mL | ORAL_SOLUTION | Freq: Four times a day (QID) | ORAL | 0 refills | Status: DC | PRN

## 2022-07-06 MED ORDER — MOLNUPIRAVIR EUA 200MG CAPSULE: 4.0000 | ORAL_CAPSULE | Freq: Two times a day (BID) | ORAL | 0 refills | Status: AC

## 2022-07-06 MED ORDER — ALBUTEROL SULFATE HFA 108 (90 BASE) MCG/ACT IN AERS: 2.0000 | INHALATION_SPRAY | Freq: Four times a day (QID) | RESPIRATORY_TRACT | 1 refills | Status: DC | PRN

## 2022-07-06 NOTE — Telephone Encounter (Signed)
Copied from [place] [id number]. Topic: General - Other >> Jul 06, 2022  1:16 PM Theresa Best wrote: Reason for CRM: Patient is calling requesting to speak with Baxter Flattery. Patient will not provide any information, stating that she wants to speak with Baxter Flattery about her health.  Please advise.

## 2022-07-06 NOTE — Progress Notes (Signed)
Date:  07/06/2022   Name:  Theresa Best   DOB:  08/25/1960   MRN:  [id number]   Chief Complaint: Cough (Started yesterday. Headache, throat and ear pain. Sinus pain, green/yellow congestion.)  Fever  This is a new problem. The current episode started yesterday. The problem occurs constantly. The problem has been waxing and waning. Associated symptoms include congestion, coughing, headaches, muscle aches and a sore throat. Pertinent negatives include no abdominal pain, chest pain, diarrhea, ear pain, nausea, rash, sleepiness, urinary pain, vomiting or wheezing. She has tried acetaminophen for the symptoms. The treatment provided no relief.    Lab Results  Component Value Date   NA 138 09/17/2021   K 4.4 09/17/2021   CO2 23 09/17/2021   GLUCOSE 99 09/17/2021   BUN 14 09/17/2021   CREATININE 0.53 (L) 09/17/2021   CALCIUM 9.9 09/17/2021   EGFR 106 09/17/2021   GFRNONAA 97 05/08/2020   Lab Results  Component Value Date   CHOL 263 (H) 09/17/2021   HDL 53 09/17/2021   LDLCALC 189 (H) 09/17/2021   TRIG 119 09/17/2021   CHOLHDL 5.8 (H) 01/29/2021   No results found for: "TSH" No results found for: "HGBA1C" Lab Results  Component Value Date   WBC 8.2 02/23/2022   HGB 13.8 02/23/2022   HCT 42.3 02/23/2022   MCV 90.6 02/23/2022   PLT 328 02/23/2022   No results found for: "ALT", "AST", "GGT", "ALKPHOS", "BILITOT" No results found for: "25OHVITD2", "25OHVITD3", "VD25OH"   Review of Systems  Constitutional:  Positive for chills, diaphoresis, fatigue and fever.  HENT:  Positive for congestion, postnasal drip, rhinorrhea and sore throat. Negative for ear pain.   Respiratory:  Positive for cough. Negative for wheezing.   Cardiovascular:  Negative for chest pain.  Gastrointestinal:  Negative for abdominal pain, diarrhea, nausea and vomiting.  Genitourinary:  Negative for dysuria.  Skin:  Negative for rash.  Neurological:  Positive for headaches.    Patient Active Problem  List   Diagnosis Date Noted   Baker's cyst of knee, left 07/04/2022   Lymphedema 06/30/2022   Chronic venous insufficiency 03/19/2022   Varicose veins of right lower extremity with pain 07/22/2019   Familial hypercholesterolemia 07/22/2019   Uterine prolapse 08/16/2018   SUI (stress urinary incontinence, female) 08/16/2018   POP-Q stage 2 cystocele 08/16/2018   Essential hypertension 11/03/2016   Class 3 severe obesity due to excess calories with serious comorbidity and body mass index (BMI) of 40.0 to 44.9 in adult ([place]) 11/03/2016    No Known Allergies  History reviewed. No pertinent surgical history.  Social History   Tobacco Use   Smoking status: Never   Smokeless tobacco: Never  Substance Use Topics   Alcohol use: No    Alcohol/week: 0.0 standard drinks of alcohol   Drug use: No     Medication list has been reviewed and updated.  Current Meds  Medication Sig   albuterol (PROVENTIL) (2.5 MG/3ML) 0.083% nebulizer solution Take 3 mLs (2.5 mg total) by nebulization every 6 (six) hours as needed for wheezing or shortness of breath.   albuterol (VENTOLIN HFA) 108 (90 Base) MCG/ACT inhaler Inhale 2 puffs into the lungs every 6 (six) hours as needed for wheezing or shortness of breath.   amLODipine (NORVASC) 5 MG tablet Take 1 tablet by mouth once daily   atorvastatin (LIPITOR) 10 MG tablet Take 1 tablet (10 mg total) by mouth daily.   Azelaic Acid 15 % gel Apply topically every  morning.   budesonide-formoterol (SYMBICORT) 80-4.5 MCG/ACT inhaler Inhale 2 puffs into the lungs 2 (two) times daily.   guaiFENesin-codeine (ROBITUSSIN AC) 100-10 MG/5ML syrup Take 5 mLs by mouth 3 (three) times daily as needed for cough.   hydrochlorothiazide (HYDRODIURIL) 12.5 MG tablet Take 1 tablet by mouth once daily   hydrocortisone 2.5 % ointment Apply topically 2 (two) times daily.   montelukast (SINGULAIR) 10 MG tablet Take 1 tablet (10 mg total) by mouth at bedtime.   predniSONE (DELTASONE)  10 MG tablet Take 1 tablet (10 mg total) by mouth daily with breakfast.   tretinoin (RETIN-A) 0.05 % cream Apply topically at bedtime.       04/05/2022    4:28 PM 02/23/2022   11:07 AM 09/17/2021    8:51 AM 01/29/2021    8:10 AM  GAD 7 : Generalized Anxiety Score  Nervous, Anxious, on Edge 0 0 0 0  Control/stop worrying 0 1 0 0  Worry too much - different things 0 1 0 0  Trouble relaxing 0 0 0 0  Restless 0 0 0 0  Easily annoyed or irritable 0 0 0 0  Afraid - awful might happen 0 0 0 0  Total GAD 7 Score 0 2 0 0  Anxiety Difficulty Not difficult at all Not difficult at all Not difficult at all        04/05/2022    4:28 PM 02/23/2022   11:06 AM 09/17/2021    8:51 AM  Depression screen PHQ 2/9  Decreased Interest 0 1 0  Down, Depressed, Hopeless 0 0 0  PHQ - 2 Score 0 1 0  Altered sleeping 0 0 0  Tired, decreased energy 0 1 0  Change in appetite 0 1 0  Feeling bad or failure about yourself  0 0 0  Trouble concentrating 0 0 0  Moving slowly or fidgety/restless 0 0 0  Suicidal thoughts 0 0 0  PHQ-9 Score 0 3 0  Difficult doing work/chores Not difficult at all Not difficult at all Not difficult at all    BP Readings from Last 3 Encounters:  07/06/22 129/76  07/04/22 (!) 174/97  04/05/22 (!) 138/98    Physical Exam Vitals and nursing note reviewed.  HENT:     Head: Normocephalic.     Right Ear: Tympanic membrane, ear canal and external ear normal.     Left Ear: Tympanic membrane, ear canal and external ear normal.     Mouth/Throat:     Mouth: Mucous membranes are moist.     Pharynx: No oropharyngeal exudate.  Cardiovascular:     Pulses: Normal pulses.     Heart sounds: No murmur heard.    No friction rub. No gallop.  Pulmonary:     Breath sounds: No wheezing, rhonchi or rales.  Chest:     Chest wall: No tenderness.     Wt Readings from Last 3 Encounters:  07/06/22 298 lb (135.2 kg)  07/04/22 299 lb (135.6 kg)  04/05/22 300 lb (136.1 kg)    BP 129/76    Pulse (!) 105   Temp 98.6 F (37 C) (Oral)   Ht 5\' 7"  (1.702 m)   Wt 298 lb (135.2 kg)   SpO2 94%   BMI 46.67 kg/m   Assessment and Plan:

## 2022-07-06 NOTE — Progress Notes (Deleted)
Date:  07/06/2022   Name:  Theresa Best   DOB:  1961/02/27   MRN:  [id number]   Chief Complaint: Cough (Started yesterday. Headache, throat and ear pain. Sinus pain, green/yellow congestion.)  Cough    Lab Results  Component Value Date   NA 138 09/17/2021   K 4.4 09/17/2021   CO2 23 09/17/2021   GLUCOSE 99 09/17/2021   BUN 14 09/17/2021   CREATININE 0.53 (L) 09/17/2021   CALCIUM 9.9 09/17/2021   EGFR 106 09/17/2021   GFRNONAA 97 05/08/2020   Lab Results  Component Value Date   CHOL 263 (H) 09/17/2021   HDL 53 09/17/2021   LDLCALC 189 (H) 09/17/2021   TRIG 119 09/17/2021   CHOLHDL 5.8 (H) 01/29/2021   No results found for: "TSH" No results found for: "HGBA1C" Lab Results  Component Value Date   WBC 8.2 02/23/2022   HGB 13.8 02/23/2022   HCT 42.3 02/23/2022   MCV 90.6 02/23/2022   PLT 328 02/23/2022   No results found for: "ALT", "AST", "GGT", "ALKPHOS", "BILITOT" No results found for: "25OHVITD2", "25OHVITD3", "VD25OH"   Review of Systems  Respiratory:  Positive for cough.     Patient Active Problem List   Diagnosis Date Noted   Baker's cyst of knee, left 07/04/2022   Lymphedema 06/30/2022   Chronic venous insufficiency 03/19/2022   Varicose veins of right lower extremity with pain 07/22/2019   Familial hypercholesterolemia 07/22/2019   Uterine prolapse 08/16/2018   SUI (stress urinary incontinence, female) 08/16/2018   POP-Q stage 2 cystocele 08/16/2018   Essential hypertension 11/03/2016   Class 3 severe obesity due to excess calories with serious comorbidity and body mass index (BMI) of 40.0 to 44.9 in adult ([place]) 11/03/2016    No Known Allergies  History reviewed. No pertinent surgical history.  Social History   Tobacco Use   Smoking status: Never   Smokeless tobacco: Never  Substance Use Topics   Alcohol use: No    Alcohol/week: 0.0 standard drinks of alcohol   Drug use: No     Medication list has been reviewed and  updated.  Current Meds  Medication Sig   albuterol (PROVENTIL) (2.5 MG/3ML) 0.083% nebulizer solution Take 3 mLs (2.5 mg total) by nebulization every 6 (six) hours as needed for wheezing or shortness of breath.   albuterol (VENTOLIN HFA) 108 (90 Base) MCG/ACT inhaler Inhale 2 puffs into the lungs every 6 (six) hours as needed for wheezing or shortness of breath.   amLODipine (NORVASC) 5 MG tablet Take 1 tablet by mouth once daily   atorvastatin (LIPITOR) 10 MG tablet Take 1 tablet (10 mg total) by mouth daily.   Azelaic Acid 15 % gel Apply topically every morning.   budesonide-formoterol (SYMBICORT) 80-4.5 MCG/ACT inhaler Inhale 2 puffs into the lungs 2 (two) times daily.   guaiFENesin-codeine (ROBITUSSIN AC) 100-10 MG/5ML syrup Take 5 mLs by mouth 3 (three) times daily as needed for cough.   hydrochlorothiazide (HYDRODIURIL) 12.5 MG tablet Take 1 tablet by mouth once daily   hydrocortisone 2.5 % ointment Apply topically 2 (two) times daily.   montelukast (SINGULAIR) 10 MG tablet Take 1 tablet (10 mg total) by mouth at bedtime.   predniSONE (DELTASONE) 10 MG tablet Take 1 tablet (10 mg total) by mouth daily with breakfast.   tretinoin (RETIN-A) 0.05 % cream Apply topically at bedtime.       04/05/2022    4:28 PM 02/23/2022   11:07 AM 09/17/2021  8:51 AM 01/29/2021    8:10 AM  GAD 7 : Generalized Anxiety Score  Nervous, Anxious, on Edge 0 0 0 0  Control/stop worrying 0 1 0 0  Worry too much - different things 0 1 0 0  Trouble relaxing 0 0 0 0  Restless 0 0 0 0  Easily annoyed or irritable 0 0 0 0  Afraid - awful might happen 0 0 0 0  Total GAD 7 Score 0 2 0 0  Anxiety Difficulty Not difficult at all Not difficult at all Not difficult at all        04/05/2022    4:28 PM 02/23/2022   11:06 AM 09/17/2021    8:51 AM  Depression screen PHQ 2/9  Decreased Interest 0 1 0  Down, Depressed, Hopeless 0 0 0  PHQ - 2 Score 0 1 0  Altered sleeping 0 0 0  Tired, decreased energy 0 1 0   Change in appetite 0 1 0  Feeling bad or failure about yourself  0 0 0  Trouble concentrating 0 0 0  Moving slowly or fidgety/restless 0 0 0  Suicidal thoughts 0 0 0  PHQ-9 Score 0 3 0  Difficult doing work/chores Not difficult at all Not difficult at all Not difficult at all    BP Readings from Last 3 Encounters:  07/06/22 129/76  07/04/22 (!) 174/97  04/05/22 (!) 138/98    Physical Exam  Wt Readings from Last 3 Encounters:  07/06/22 298 lb (135.2 kg)  07/04/22 299 lb (135.6 kg)  04/05/22 300 lb (136.1 kg)    BP 129/76   Pulse (!) 105   Temp 98.6 F (37 C) (Oral)   Ht 5\' 7"  (1.702 m)   Wt 298 lb (135.2 kg)   SpO2 94%   BMI 46.67 kg/m   Assessment and Plan:     Otilio Miu, MD

## 2022-08-03 ENCOUNTER — Telehealth (INDEPENDENT_AMBULATORY_CARE_PROVIDER_SITE_OTHER): Payer: Self-pay | Admitting: Vascular Surgery

## 2022-08-03 NOTE — Telephone Encounter (Signed)
Spoke to pt regarding her laser ablation appt that she wants to schedule. I explained that I just got word yesterday that I can begin doing PA and scheduling appts. I did explain that I will do them in order of when they were in the office and pt acknowledged. I advised that I will get her in as soon as I can.

## 2022-08-09 ENCOUNTER — Other Ambulatory Visit: Payer: Self-pay | Admitting: Family Medicine

## 2022-08-09 DIAGNOSIS — E782 Mixed hyperlipidemia: Secondary | ICD-10-CM

## 2022-08-09 DIAGNOSIS — I10 Essential (primary) hypertension: Secondary | ICD-10-CM

## 2022-08-09 DIAGNOSIS — J4521 Mild intermittent asthma with (acute) exacerbation: Secondary | ICD-10-CM

## 2022-08-16 ENCOUNTER — Ambulatory Visit: Payer: BC Managed Care – PPO | Admitting: Family Medicine

## 2022-08-16 ENCOUNTER — Encounter: Payer: Self-pay | Admitting: Family Medicine

## 2022-08-16 VITALS — BP 110/68 | HR 74 | Ht 67.0 in | Wt 295.0 lb

## 2022-08-16 DIAGNOSIS — I10 Essential (primary) hypertension: Secondary | ICD-10-CM | POA: Diagnosis not present

## 2022-08-16 DIAGNOSIS — E782 Mixed hyperlipidemia: Secondary | ICD-10-CM | POA: Diagnosis not present

## 2022-08-16 DIAGNOSIS — J4521 Mild intermittent asthma with (acute) exacerbation: Secondary | ICD-10-CM

## 2022-08-16 DIAGNOSIS — R053 Chronic cough: Secondary | ICD-10-CM | POA: Diagnosis not present

## 2022-08-16 MED ORDER — ATORVASTATIN CALCIUM 10 MG PO TABS: 10.0000 mg | ORAL_TABLET | Freq: Every day | ORAL | 1 refills | Status: DC

## 2022-08-16 MED ORDER — MONTELUKAST SODIUM 10 MG PO TABS: 10.0000 mg | ORAL_TABLET | Freq: Every day | ORAL | 1 refills | Status: DC

## 2022-08-16 MED ORDER — ALBUTEROL SULFATE HFA 108 (90 BASE) MCG/ACT IN AERS: 2.0000 | INHALATION_SPRAY | Freq: Four times a day (QID) | RESPIRATORY_TRACT | 1 refills | Status: DC | PRN

## 2022-08-16 MED ORDER — AMLODIPINE BESYLATE 5 MG PO TABS: 5.0000 mg | ORAL_TABLET | Freq: Every day | ORAL | 1 refills | Status: DC

## 2022-08-16 MED ORDER — BUDESONIDE-FORMOTEROL FUMARATE 80-4.5 MCG/ACT IN AERO: 2.0000 | INHALATION_SPRAY | Freq: Two times a day (BID) | RESPIRATORY_TRACT | 1 refills | Status: DC

## 2022-08-16 MED ORDER — HYDROCHLOROTHIAZIDE 12.5 MG PO TABS: 12.5000 mg | ORAL_TABLET | Freq: Every day | ORAL | 1 refills | Status: DC

## 2022-08-16 MED ORDER — ALBUTEROL SULFATE (2.5 MG/3ML) 0.083% IN NEBU: 2.5000 mg | INHALATION_SOLUTION | Freq: Four times a day (QID) | RESPIRATORY_TRACT | 1 refills | Status: DC | PRN

## 2022-08-16 NOTE — Progress Notes (Signed)
Date:  08/16/2022   Name:  Theresa Best   DOB:  1960/12/11   MRN:  [id number]   Chief Complaint: Asthma, Hypertension, Hyperlipidemia, and Allergic Rhinitis   Asthma She complains of cough. There is no difficulty breathing, frequent throat clearing, shortness of breath, sputum production or wheezing. This is a new problem. The current episode started 1 to 4 weeks ago (cough). The problem occurs intermittently. The problem has been gradually improving. The cough is non-productive. Pertinent negatives include no chest pain, ear congestion, ear pain, fever, headaches, malaise/fatigue, myalgias, nasal congestion, PND, rhinorrhea, sneezing or sore throat. Her past medical history is significant for asthma.  Hypertension The current episode started more than 1 year ago. The problem has been gradually improving since onset. The problem is controlled. Pertinent negatives include no chest pain, headaches, malaise/fatigue, orthopnea, PND or shortness of breath. Past treatments include calcium channel blockers and diuretics. The current treatment provides moderate improvement. There is no history of CAD/MI or CVA. There is no history of chronic renal disease, a hypertension causing med or renovascular disease.  Hyperlipidemia This is a chronic problem. The current episode started more than 1 year ago. The problem is controlled. Recent lipid tests were reviewed and are normal. She has no history of chronic renal disease, diabetes, hypothyroidism, liver disease, obesity or nephrotic syndrome. Pertinent negatives include no chest pain, myalgias or shortness of breath. Current antihyperlipidemic treatment includes statins. The current treatment provides moderate improvement of lipids. There are no compliance problems.  Risk factors for coronary artery disease include dyslipidemia and hypertension.    Lab Results  Component Value Date   NA 138 09/17/2021   K 4.4 09/17/2021   CO2 23 09/17/2021   GLUCOSE 99  09/17/2021   BUN 14 09/17/2021   CREATININE 0.53 (L) 09/17/2021   CALCIUM 9.9 09/17/2021   EGFR 106 09/17/2021   GFRNONAA 97 05/08/2020   Lab Results  Component Value Date   CHOL 263 (H) 09/17/2021   HDL 53 09/17/2021   LDLCALC 189 (H) 09/17/2021   TRIG 119 09/17/2021   CHOLHDL 5.8 (H) 01/29/2021   No results found for: "TSH" No results found for: "HGBA1C" Lab Results  Component Value Date   WBC 8.2 02/23/2022   HGB 13.8 02/23/2022   HCT 42.3 02/23/2022   MCV 90.6 02/23/2022   PLT 328 02/23/2022   No results found for: "ALT", "AST", "GGT", "ALKPHOS", "BILITOT" No results found for: "25OHVITD2", "25OHVITD3", "VD25OH"   Review of Systems  Constitutional: Negative.  Negative for chills, fatigue, fever, malaise/fatigue and unexpected weight change.  HENT:  Negative for congestion, ear discharge, ear pain, rhinorrhea, sinus pressure, sneezing and sore throat.   Respiratory:  Positive for cough. Negative for sputum production, shortness of breath, wheezing and stridor.   Cardiovascular:  Negative for chest pain, orthopnea and PND.  Gastrointestinal:  Negative for abdominal pain, blood in stool, constipation, diarrhea and nausea.  Genitourinary:  Negative for dysuria, flank pain, frequency, hematuria, urgency and vaginal discharge.  Musculoskeletal:  Negative for arthralgias, back pain and myalgias.  Skin:  Negative for rash.  Neurological:  Negative for dizziness, weakness and headaches.  Hematological:  Negative for adenopathy. Does not bruise/bleed easily.  Psychiatric/Behavioral:  Negative for dysphoric mood. The patient is not nervous/anxious.     Patient Active Problem List   Diagnosis Date Noted   Baker's cyst of knee, left 07/04/2022   Lymphedema 06/30/2022   Chronic venous insufficiency 03/19/2022   Varicose veins of right  lower extremity with pain 07/22/2019   Familial hypercholesterolemia 07/22/2019   Uterine prolapse 08/16/2018   SUI (stress urinary  incontinence, female) 08/16/2018   POP-Q stage 2 cystocele 08/16/2018   Essential hypertension 11/03/2016   Class 3 severe obesity due to excess calories with serious comorbidity and body mass index (BMI) of 40.0 to 44.9 in adult [facility]) 11/03/2016    No Known Allergies  No past surgical history on file.  Social History   Tobacco Use   Smoking status: Never   Smokeless tobacco: Never  Substance Use Topics   Alcohol use: No    Alcohol/week: 0.0 standard drinks of alcohol   Drug use: No     Medication list has been reviewed and updated.  Current Meds  Medication Sig   albuterol (PROVENTIL) (2.5 MG/3ML) 0.083% nebulizer solution Take 3 mLs (2.5 mg total) by nebulization every 6 (six) hours as needed for wheezing or shortness of breath.   albuterol (VENTOLIN HFA) 108 (90 Base) MCG/ACT inhaler Inhale 2 puffs into the lungs every 6 (six) hours as needed for wheezing or shortness of breath.   amLODipine (NORVASC) 5 MG tablet Take 1 tablet by mouth once daily   atorvastatin (LIPITOR) 10 MG tablet Take 1 tablet by mouth once daily   Azelaic Acid 15 % gel Apply topically every morning.   budesonide-formoterol (SYMBICORT) 80-4.5 MCG/ACT inhaler Inhale 2 puffs into the lungs 2 (two) times daily.   hydrochlorothiazide (HYDRODIURIL) 12.5 MG tablet Take 1 tablet by mouth once daily   hydrocortisone 2.5 % ointment Apply topically 2 (two) times daily.   montelukast (SINGULAIR) 10 MG tablet TAKE 1 TABLET BY MOUTH AT BEDTIME   tretinoin (RETIN-A) 0.05 % cream Apply topically at bedtime.   [DISCONTINUED] predniSONE (DELTASONE) 10 MG tablet Take 1 tablet (10 mg total) by mouth daily with breakfast.       08/16/2022    3:00 PM 04/05/2022    4:28 PM 02/23/2022   11:07 AM 09/17/2021    8:51 AM  GAD 7 : Generalized Anxiety Score  Nervous, Anxious, on Edge 0 0 0 0  Control/stop worrying 0 0 1 0  Worry too much - different things 0 0 1 0  Trouble relaxing 0 0 0 0  Restless 0 0 0 0  Easily annoyed  or irritable 0 0 0 0  Afraid - awful might happen 0 0 0 0  Total GAD 7 Score 0 0 2 0  Anxiety Difficulty Not difficult at all Not difficult at all Not difficult at all Not difficult at all       08/16/2022    3:00 PM 04/05/2022    4:28 PM 02/23/2022   11:06 AM  Depression screen PHQ 2/9  Decreased Interest 0 0 1  Down, Depressed, Hopeless 0 0 0  PHQ - 2 Score 0 0 1  Altered sleeping 0 0 0  Tired, decreased energy 0 0 1  Change in appetite 0 0 1  Feeling bad or failure about yourself  0 0 0  Trouble concentrating 0 0 0  Moving slowly or fidgety/restless 0 0 0  Suicidal thoughts 0 0 0  PHQ-9 Score 0 0 3  Difficult doing work/chores Not difficult at all Not difficult at all Not difficult at all    BP Readings from Last 3 Encounters:  08/16/22 110/68  07/06/22 129/76  07/04/22 (!) 174/97    Physical Exam Vitals and nursing note reviewed. Exam conducted with a chaperone present.  Constitutional:  General: She is not in acute distress.    Appearance: She is not diaphoretic.  HENT:     Head: Normocephalic and atraumatic.     Right Ear: Tympanic membrane, ear canal and external ear normal.     Left Ear: Tympanic membrane, ear canal and external ear normal.     Nose: Nose normal. No congestion or rhinorrhea.     Mouth/Throat:     Pharynx: No oropharyngeal exudate or posterior oropharyngeal erythema.  Eyes:     General: No scleral icterus.       Right eye: No discharge.        Left eye: No discharge.     Conjunctiva/sclera: Conjunctivae normal.     Pupils: Pupils are equal, round, and reactive to light.  Neck:     Thyroid: No thyromegaly.     Vascular: No JVD.  Cardiovascular:     Rate and Rhythm: Normal rate and regular rhythm.     Heart sounds: Normal heart sounds. No murmur heard.    No friction rub. No gallop.  Pulmonary:     Effort: Pulmonary effort is normal.     Breath sounds: Normal breath sounds. No wheezing, rhonchi or rales.  Abdominal:     General:  Bowel sounds are normal.     Palpations: Abdomen is soft. There is no mass.     Tenderness: There is no abdominal tenderness. There is no guarding or rebound.  Musculoskeletal:        General: Normal range of motion.     Cervical back: Normal range of motion and neck supple.  Lymphadenopathy:     Cervical: No cervical adenopathy.  Skin:    General: Skin is warm and dry.  Neurological:     Mental Status: She is alert.     Deep Tendon Reflexes: Reflexes are normal and symmetric.     Wt Readings from Last 3 Encounters:  08/16/22 295 lb (133.8 kg)  07/06/22 298 lb (135.2 kg)  07/04/22 299 lb (135.6 kg)    BP 110/68   Pulse 74   Ht '5\' 7"'$  (1.702 m)   Wt 295 lb (133.8 kg)   SpO2 96%   BMI 46.20 kg/m   Assessment and Plan:  1. Mild intermittent reactive airway disease with acute exacerbation Chronic appearing chronic.  Episodic.  Stable.  Intermittent and mild in intensity.  With current pollen count being elevated for trees patient has been instructed to continue her albuterol rather nebulization or inhaler on a regular basis every 6 hours.  Patient is also going to continue her Symbicort 80-4.52 puffs twice a day and Singulair 10 mg once at bedtime. - albuterol (PROVENTIL) (2.5 MG/3ML) 0.083% nebulizer solution; Take 3 mLs (2.5 mg total) by nebulization every 6 (six) hours as needed for wheezing or shortness of breath.  Dispense: 150 mL; Refill: 1 - albuterol (VENTOLIN HFA) 108 (90 Base) MCG/ACT inhaler; Inhale 2 puffs into the lungs every 6 (six) hours as needed for wheezing or shortness of breath.  Dispense: 6.7 g; Refill: 1 - budesonide-formoterol (SYMBICORT) 80-4.5 MCG/ACT inhaler; Inhale 2 puffs into the lungs 2 (two) times daily.  Dispense: 2 each; Refill: 1 - montelukast (SINGULAIR) 10 MG tablet; Take 1 tablet (10 mg total) by mouth at bedtime.  Dispense: 90 tablet; Refill: 1  2. Essential hypertension Chronic.  Controlled.  Stable.  Blood pressure 110/68.  Asymptomatic.   Tolerating medications well.  Continue amlodipine 5 mg once a day and hydrochlorothiazide 12.5 mg once a  day.  Will check CMP for electrolytes and GFR. - amLODipine (NORVASC) 5 MG tablet; Take 1 tablet (5 mg total) by mouth daily.  Dispense: 90 tablet; Refill: 1 - hydrochlorothiazide (HYDRODIURIL) 12.5 MG tablet; Take 1 tablet (12.5 mg total) by mouth daily.  Dispense: 90 tablet; Refill: 1 - Comprehensive Metabolic Panel (CMET)  3. Mixed hyperlipidemia Chronic.  Controlled.  Stable.  Continue atorvastatin 10 mg once a day.  Will check lipid panel for current level of LDL control. - atorvastatin (LIPITOR) 10 MG tablet; Take 1 tablet (10 mg total) by mouth daily.  Dispense: 90 tablet; Refill: 1 - Lipid Panel With LDL/HDL Ratio  4. Persistent cough for 3 weeks or longer Patient continues to have a mild cough but is not with dyspnea and there is no wheezing or stridor noted she will continue her Symbicort but it has been suggested for control to use Mucinex DM on a as needed basis. - budesonide-formoterol (SYMBICORT) 80-4.5 MCG/ACT inhaler; Inhale 2 puffs into the lungs 2 (two) times daily.  Dispense: 2 each; Refill: 1    Otilio Miu, MD

## 2022-08-17 ENCOUNTER — Other Ambulatory Visit: Payer: Self-pay

## 2022-08-17 DIAGNOSIS — E782 Mixed hyperlipidemia: Secondary | ICD-10-CM

## 2022-08-17 LAB — COMPREHENSIVE METABOLIC PANEL
ALT: 16 IU/L (ref 0–32)
AST: 16 IU/L (ref 0–40)
Albumin/Globulin Ratio: 1.7 (ref 1.2–2.2)
Albumin: 4.4 g/dL (ref 3.8–4.9)
Alkaline Phosphatase: 134 IU/L — ABNORMAL HIGH (ref 44–121)
BUN/Creatinine Ratio: 23 (ref 12–28)
BUN: 16 mg/dL (ref 8–27)
Bilirubin Total: 0.6 mg/dL (ref 0.0–1.2)
CO2: 25 mmol/L (ref 20–29)
Calcium: 9.5 mg/dL (ref 8.7–10.3)
Chloride: 98 mmol/L (ref 96–106)
Creatinine, Ser: 0.69 mg/dL (ref 0.57–1.00)
Globulin, Total: 2.6 g/dL (ref 1.5–4.5)
Glucose: 87 mg/dL (ref 70–99)
Potassium: 4.4 mmol/L (ref 3.5–5.2)
Sodium: 137 mmol/L (ref 134–144)
Total Protein: 7 g/dL (ref 6.0–8.5)
eGFR: 99 mL/min/{1.73_m2} (ref >59)

## 2022-08-17 LAB — LIPID PANEL WITH LDL/HDL RATIO
Cholesterol, Total: 216 mg/dL — ABNORMAL HIGH (ref 100–199)
HDL: 49 mg/dL (ref >39)
LDL Chol Calc (NIH): 146 mg/dL — ABNORMAL HIGH (ref 0–99)
LDL/HDL Ratio: 3 ratio (ref 0.0–3.2)
Triglycerides: 116 mg/dL (ref 0–149)
VLDL Cholesterol Cal: 21 mg/dL (ref 5–40)

## 2022-08-17 MED ORDER — ATORVASTATIN CALCIUM 20 MG PO TABS: 20.0000 mg | ORAL_TABLET | Freq: Every day | ORAL | 1 refills | Status: DC

## 2022-10-02 ENCOUNTER — Other Ambulatory Visit: Payer: Self-pay | Admitting: Family Medicine

## 2022-10-02 DIAGNOSIS — J4521 Mild intermittent asthma with (acute) exacerbation: Secondary | ICD-10-CM

## 2022-10-03 NOTE — Telephone Encounter (Signed)
Requested Prescriptions  Pending Prescriptions Disp Refills   albuterol (VENTOLIN HFA) 108 (90 Base) MCG/ACT inhaler [Pharmacy Med Name: Albuterol Sulfate HFA 108 (90 Base) MCG/ACT Inhalation Aerosol Solution] 9 g 0    Sig: INHALE 2 PUFFS BY MOUTH EVERY 6 HOURS AS NEEDED FOR WHEEZING FOR SHORTNESS OF BREATH     Pulmonology:  Beta Agonists 2 Passed - 10/02/2022  7:51 AM      Passed - Last BP in normal range    BP Readings from Last 1 Encounters:  08/16/22 110/68         Passed - Last Heart Rate in normal range    Pulse Readings from Last 1 Encounters:  08/16/22 74         Passed - Valid encounter within last 12 months    Recent Outpatient Visits           1 month ago Mild intermittent reactive airway disease with acute exacerbation   [place] [facility]   2 months ago Acute cough   [place] [facility]   6 months ago Mild intermittent reactive airway disease with acute exacerbation   [place] [facility]   7 months ago Lower GI bleed   [facility]   1 year ago Annual physical exam   [facility]       Future Appointments             In 4 months [name], MD [facility] at [facility], [facility]

## 2022-10-10 ENCOUNTER — Telehealth (INDEPENDENT_AMBULATORY_CARE_PROVIDER_SITE_OTHER): Payer: Self-pay | Admitting: Vascular Surgery

## 2022-10-10 NOTE — Telephone Encounter (Signed)
Spoke with pt regarding laser ablation procedure. I explained to pt that I am currently still working on November patients and I would get to her as soon as I can. Pt understood and stated that she is just ready to get this done. I acknowledged and will try to get her in ASAP.

## 2022-11-22 ENCOUNTER — Other Ambulatory Visit: Payer: Self-pay | Admitting: *Deleted

## 2022-11-22 ENCOUNTER — Ambulatory Visit: Payer: Self-pay | Admitting: *Deleted

## 2022-11-22 DIAGNOSIS — I10 Essential (primary) hypertension: Secondary | ICD-10-CM

## 2022-11-22 NOTE — Telephone Encounter (Signed)
medication questions   Per agent: "Pt called stated she need nurse to call her as she was prescribed a medication that is new to her amLODipine (NORVASC) 5 MG tablet. She was not aware she was on that med and want to know if she should take it along with hydrochlorothiazide (HYDRODIURIL) 12.5 MG tablet. Please f/u with pt."        Chief Complaint: Medication Question Symptoms: NA Frequency: NA Pertinent Negatives: Patient denies NA Disposition: [] ED /[] Urgent Care (no appt availability in office) / [] Appointment(In office/virtual)/ []  [place] [facility]/ [] Home Care/ [] Refused Recommended Disposition /[] [place] [facility]/ [x]  Follow-up with PCP Additional Notes: Pt states she did not know she was to take amlodipine 5mg . States SHe takes HCTZ, has never taken the amlodipine. States went to pharmacy today and they a had it there for her. Last OV note 08/16/22 noted med on profile. Med is on current profile.  States she has been taking HCTZ as ordered.  Assured pt NT would route to practice for PCPs review. Pt does not check BP at home. No current BP values. Please advise.    Reason for Disposition  [1] Caller has URGENT medicine question about med that PCP or specialist prescribed AND [2] triager unable to answer question  Answer Assessment - Initial Assessment Questions 1. NAME of MEDICINE: "What medicine(s) are you calling about?"     Amlodipine. 2. QUESTION: "What is your question?" (e.g., double dose of medicine, side effect)     "Am I supposed to take that medication?"  Protocols used: Medication Question Call-A-AH

## 2022-11-23 NOTE — Telephone Encounter (Signed)
Unable to refill per protocol, Rx request is too soon. Last refill 08/16/22 for 90 and 1 refill.  Requested Prescriptions  Pending Prescriptions Disp Refills   hydrochlorothiazide (HYDRODIURIL) 12.5 MG tablet 90 tablet 1    Sig: Take 1 tablet (12.5 mg total) by mouth daily.     Cardiovascular: Diuretics - Thiazide Passed - 11/22/2022  5:33 PM      Passed - Cr in normal range and within 180 days    Creatinine, Ser  Date Value Ref Range Status  08/16/2022 0.69 0.57 - 1.00 mg/dL Final         Passed - K in normal range and within 180 days    Potassium  Date Value Ref Range Status  08/16/2022 4.4 3.5 - 5.2 mmol/L Final         Passed - Na in normal range and within 180 days    Sodium  Date Value Ref Range Status  08/16/2022 137 134 - 144 mmol/L Final         Passed - Last BP in normal range    BP Readings from Last 1 Encounters:  08/16/22 110/68         Passed - Valid encounter within last 6 months    Recent Outpatient Visits           3 months ago Mild intermittent reactive airway disease with acute exacerbation   [place] [facility]   4 months ago Acute cough   [place] [facility]   7 months ago Mild intermittent reactive airway disease with acute exacerbation    [facility]   9 months ago Lower GI bleed   [facility]   1 year ago Annual physical exam   [facility]       Future Appointments             In 2 months [name], MD [facility] at [facility], [facility]

## 2022-11-25 ENCOUNTER — Telehealth (INDEPENDENT_AMBULATORY_CARE_PROVIDER_SITE_OTHER): Payer: Self-pay

## 2022-12-01 ENCOUNTER — Other Ambulatory Visit: Payer: Self-pay | Admitting: Family Medicine

## 2022-12-01 DIAGNOSIS — Z1231 Encounter for screening mammogram for malignant neoplasm of breast: Secondary | ICD-10-CM

## 2022-12-09 ENCOUNTER — Ambulatory Visit
Admission: RE | Admit: 2022-12-09 | Discharge: 2022-12-09 | Disposition: A | Discharge status: Home / Self Care | Payer: BC Managed Care – PPO | Source: Ambulatory Visit | Attending: Family Medicine | Admitting: Family Medicine

## 2022-12-09 DIAGNOSIS — Z1231 Encounter for screening mammogram for malignant neoplasm of breast: Secondary | ICD-10-CM

## 2023-01-19 ENCOUNTER — Ambulatory Visit (INDEPENDENT_AMBULATORY_CARE_PROVIDER_SITE_OTHER): Payer: BC Managed Care – PPO | Admitting: Vascular Surgery

## 2023-01-19 ENCOUNTER — Encounter (INDEPENDENT_AMBULATORY_CARE_PROVIDER_SITE_OTHER): Payer: Self-pay | Admitting: Vascular Surgery

## 2023-01-19 VITALS — BP 126/78 | HR 79 | Resp 18 | Ht 67.0 in | Wt 293.0 lb

## 2023-01-19 DIAGNOSIS — I83811 Varicose veins of right lower extremities with pain: Secondary | ICD-10-CM | POA: Diagnosis not present

## 2023-01-19 NOTE — Progress Notes (Signed)
    MRN : [id number]  Theresa Best is a 62 y.o. (May 01, 1961) female who presents with chief complaint of No chief complaint on file. .    The patient's right lower extremity was sterilely prepped and draped.  The ultrasound machine was used to visualize the right great saphenous vein throughout its course.  A segment below the knee was selected for access.  The saphenous vein was accessed without difficulty using ultrasound guidance with a micropuncture needle.   An 0.018  wire was placed beyond the saphenofemoral junction through the sheath and the microneedle was removed.  The 65 cm sheath was then placed over the wire and the wire and dilator were removed.  The laser fiber was placed through the sheath and its tip was placed approximately 2 cm below the saphenofemoral junction.  Tumescent anesthesia was then created with a dilute lidocaine solution.  Laser energy was then delivered with constant withdrawal of the sheath and laser fiber.  Approximately 1952 Joules of energy were delivered over a length of 45 cm.  Sterile dressings were placed.  The patient tolerated the procedure well without complications.

## 2023-01-20 ENCOUNTER — Encounter (INDEPENDENT_AMBULATORY_CARE_PROVIDER_SITE_OTHER): Payer: Self-pay | Admitting: Vascular Surgery

## 2023-01-25 ENCOUNTER — Other Ambulatory Visit (INDEPENDENT_AMBULATORY_CARE_PROVIDER_SITE_OTHER): Payer: Self-pay | Admitting: Vascular Surgery

## 2023-01-25 DIAGNOSIS — I83811 Varicose veins of right lower extremities with pain: Secondary | ICD-10-CM

## 2023-01-26 ENCOUNTER — Ambulatory Visit (INDEPENDENT_AMBULATORY_CARE_PROVIDER_SITE_OTHER): Payer: BC Managed Care – PPO

## 2023-01-26 DIAGNOSIS — I83811 Varicose veins of right lower extremities with pain: Secondary | ICD-10-CM | POA: Diagnosis not present

## 2023-02-14 ENCOUNTER — Other Ambulatory Visit: Payer: Self-pay

## 2023-02-14 ENCOUNTER — Emergency Department
Admission: EM | Admit: 2023-02-14 | Discharge: 2023-02-14 | Disposition: A | Discharge status: Home / Self Care | Payer: BC Managed Care – PPO | Attending: Emergency Medicine | Admitting: Emergency Medicine

## 2023-02-14 ENCOUNTER — Other Ambulatory Visit: Payer: Self-pay | Admitting: Family Medicine

## 2023-02-14 ENCOUNTER — Emergency Department: Payer: BC Managed Care – PPO

## 2023-02-14 DIAGNOSIS — I10 Essential (primary) hypertension: Secondary | ICD-10-CM | POA: Insufficient documentation

## 2023-02-14 DIAGNOSIS — R109 Unspecified abdominal pain: Secondary | ICD-10-CM

## 2023-02-14 DIAGNOSIS — R112 Nausea with vomiting, unspecified: Secondary | ICD-10-CM | POA: Diagnosis present

## 2023-02-14 DIAGNOSIS — N200 Calculus of kidney: Secondary | ICD-10-CM | POA: Insufficient documentation

## 2023-02-14 DIAGNOSIS — Z79899 Other long term (current) drug therapy: Secondary | ICD-10-CM | POA: Diagnosis not present

## 2023-02-14 DIAGNOSIS — E782 Mixed hyperlipidemia: Secondary | ICD-10-CM

## 2023-02-14 LAB — CBC
HCT: 45.6 % (ref 36.0–46.0)
Hemoglobin: 14.8 g/dL (ref 12.0–15.0)
MCH: 29 pg (ref 26.0–34.0)
MCHC: 32.5 g/dL (ref 30.0–36.0)
MCV: 89.2 fL (ref 80.0–100.0)
Platelets: 298 10*3/uL (ref 150–400)
RBC: 5.11 MIL/uL (ref 3.87–5.11)
RDW: 14.1 % (ref 11.5–15.5)
WBC: 12.8 10*3/uL — ABNORMAL HIGH (ref 4.0–10.5)
nRBC: 0 % (ref 0.0–0.2)

## 2023-02-14 LAB — URINALYSIS, ROUTINE W REFLEX MICROSCOPIC
Bilirubin Urine: NEGATIVE
Glucose, UA: NEGATIVE mg/dL
Ketones, ur: 5 mg/dL — AB
Leukocytes,Ua: NEGATIVE
Nitrite: NEGATIVE
Protein, ur: 100 mg/dL — AB
Specific Gravity, Urine: 1.012 (ref 1.005–1.030)
pH: 8 (ref 5.0–8.0)

## 2023-02-14 LAB — BASIC METABOLIC PANEL
Anion gap: 8 (ref 5–15)
BUN: 22 mg/dL (ref 8–23)
CO2: 23 mmol/L (ref 22–32)
Calcium: 9.6 mg/dL (ref 8.9–10.3)
Chloride: 103 mmol/L (ref 98–111)
Creatinine, Ser: 0.95 mg/dL (ref 0.44–1.00)
GFR, Estimated: >60 mL/min (ref >60)
Glucose, Bld: 147 mg/dL — ABNORMAL HIGH (ref 70–99)
Potassium: 4.1 mmol/L (ref 3.5–5.1)
Sodium: 134 mmol/L — ABNORMAL LOW (ref 135–145)

## 2023-02-14 MED ORDER — CEPHALEXIN 500 MG PO CAPS: 500.0000 mg | ORAL_CAPSULE | Freq: Four times a day (QID) | ORAL | 0 refills | Status: AC

## 2023-02-14 MED ORDER — KETOROLAC TROMETHAMINE 15 MG/ML IJ SOLN
15.0000 mg | Freq: Once | INTRAMUSCULAR | Status: AC
  Administered 2023-02-14: 15 mg via INTRAVENOUS
  Filled 2023-02-14: qty 1

## 2023-02-14 MED ORDER — TAMSULOSIN HCL 0.4 MG PO CAPS: 0.4000 mg | ORAL_CAPSULE | Freq: Every day | ORAL | 0 refills | Status: DC

## 2023-02-14 MED ORDER — ONDANSETRON HCL 4 MG/2ML IJ SOLN
4.0000 mg | Freq: Once | INTRAMUSCULAR | Status: AC
  Administered 2023-02-14: 4 mg via INTRAVENOUS
  Filled 2023-02-14: qty 2

## 2023-02-14 MED ORDER — ONDANSETRON HCL 4 MG PO TABS: 4.0000 mg | ORAL_TABLET | Freq: Every day | ORAL | 0 refills | Status: DC | PRN

## 2023-02-14 MED ORDER — HYDROMORPHONE HCL 1 MG/ML IJ SOLN: 1.0000 mg | Freq: Once | INTRAMUSCULAR | Status: DC

## 2023-02-14 MED ORDER — OXYCODONE-ACETAMINOPHEN 5-325 MG PO TABS
1.0000 | ORAL_TABLET | Freq: Once | ORAL | Status: AC
  Administered 2023-02-14: 1 via ORAL
  Filled 2023-02-14: qty 1

## 2023-02-14 MED ORDER — KETOROLAC TROMETHAMINE 10 MG PO TABS
10.0000 mg | ORAL_TABLET | Freq: Three times a day (TID) | ORAL | 0 refills | Status: AC
Start: 2023-02-14 — End: 2023-02-17

## 2023-02-14 MED ORDER — HYDROMORPHONE HCL 1 MG/ML IJ SOLN
1.0000 mg | Freq: Once | INTRAMUSCULAR | Status: AC
  Administered 2023-02-14: 1 mg via INTRAVENOUS
  Filled 2023-02-14: qty 1

## 2023-02-14 MED ORDER — CEPHALEXIN 500 MG PO CAPS
500.0000 mg | ORAL_CAPSULE | Freq: Once | ORAL | Status: AC
  Administered 2023-02-14: 500 mg via ORAL
  Filled 2023-02-14: qty 1

## 2023-02-14 MED ORDER — OXYCODONE HCL 5 MG PO TABS: 5.0000 mg | ORAL_TABLET | Freq: Four times a day (QID) | ORAL | 0 refills | Status: DC | PRN

## 2023-02-14 NOTE — ED Provider Notes (Signed)
[place] EMERGENCY DEPARTMENT AT [facility] REGIONAL Provider Note   CSN: [id number] Arrival date & time: 02/14/23  1703     History  Chief Complaint  Patient presents with   Flank Pain    Theresa Best is a 62 y.o. female.  With a past medical history of hypertension presents with acute left-sided flank pain that began earlier today.  She developed acute left flank pain with nausea vomiting.  She denies any fevers or chills.  Pain is severe.  No history of kidney stones.  HPI     Home Medications Prior to Admission medications   Medication Sig Start Date End Date Taking? Authorizing Provider  cephALEXin (KEFLEX) 500 MG capsule Take 1 capsule (500 mg total) by mouth 4 (four) times daily for 7 days. 02/14/23 02/21/23 Yes Evon Slack, PA-C  ketorolac (TORADOL) 10 MG tablet Take 1 tablet (10 mg total) by mouth every 8 (eight) hours for 3 days. 02/14/23 02/17/23 Yes Evon Slack, PA-C  ondansetron (ZOFRAN) 4 MG tablet Take 1 tablet (4 mg total) by mouth daily as needed for nausea or vomiting. 02/14/23 02/14/24 Yes Evon Slack, PA-C  oxyCODONE (ROXICODONE) 5 MG immediate release tablet Take 1-2 tablets (5-10 mg total) by mouth every 6 (six) hours as needed. 02/14/23 02/14/24 Yes Evon Slack, PA-C  tamsulosin (FLOMAX) 0.4 MG CAPS capsule Take 1 capsule (0.4 mg total) by mouth daily after supper. 02/14/23  Yes Evon Slack, PA-C  albuterol (PROVENTIL) (2.5 MG/3ML) 0.083% nebulizer solution Take 3 mLs (2.5 mg total) by nebulization every 6 (six) hours as needed for wheezing or shortness of breath. 08/16/22   [name], MD  albuterol (VENTOLIN HFA) 108 (90 Base) MCG/ACT inhaler INHALE 2 PUFFS BY MOUTH EVERY 6 HOURS AS NEEDED FOR WHEEZING FOR SHORTNESS OF BREATH 10/03/22   [name], MD  amLODipine (NORVASC) 5 MG tablet Take 1 tablet (5 mg total) by mouth daily. 08/16/22   [name], MD  atorvastatin (LIPITOR) 20 MG tablet Take 1 tablet (20 mg total) by mouth  daily. 08/17/22   [name], MD  Azelaic Acid 15 % gel Apply topically every morning. 10/12/21   [provider]  budesonide-formoterol (SYMBICORT) 80-4.5 MCG/ACT inhaler Inhale 2 puffs into the lungs 2 (two) times daily. 08/16/22   [name], MD  hydrochlorothiazide (HYDRODIURIL) 12.5 MG tablet Take 1 tablet (12.5 mg total) by mouth daily. 08/16/22   [name], MD  hydrocortisone 2.5 % ointment Apply topically 2 (two) times daily. 11/22/21   [provider]  montelukast (SINGULAIR) 10 MG tablet Take 1 tablet (10 mg total) by mouth at bedtime. 08/16/22   [name], MD  tretinoin (RETIN-A) 0.05 % cream Apply topically at bedtime. 10/12/21   [provider]      Allergies    Patient has no known allergies.    Review of Systems   Review of Systems  Physical Exam Updated Vital Signs BP (!) 162/94 (BP Location: Right Arm)   Pulse 85   Temp 99 F (37.2 C) (Oral)   Resp (!) 22   Ht 5\' 7"  (1.702 m)   Wt 127 kg   SpO2 93%   BMI 43.85 kg/m  Physical Exam Constitutional:      Appearance: She is well-developed.  HENT:     Head: Normocephalic and atraumatic.  Eyes:     Conjunctiva/sclera: Conjunctivae normal.  Cardiovascular:     Rate and Rhythm: Normal  rate and regular rhythm.     Pulses: Normal pulses.     Heart sounds: Normal heart sounds.  Pulmonary:     Effort: Pulmonary effort is normal. No respiratory distress.  Abdominal:     General: There is no distension.     Tenderness: There is no abdominal tenderness. There is left CVA tenderness. There is no guarding or rebound.  Musculoskeletal:        General: Normal range of motion.     Cervical back: Normal range of motion.  Skin:    General: Skin is warm.     Findings: No rash.  Neurological:     Mental Status: She is alert and oriented to person, place, and time.  Psychiatric:        Behavior: Behavior normal.        Thought Content: Thought content normal.     ED Results /  Procedures / Treatments   Labs (all labs ordered are listed, but only abnormal results are displayed) Labs Reviewed  CBC - Abnormal; Notable for the following components:      Result Value   WBC 12.8 (*)    All other components within normal limits  BASIC METABOLIC PANEL - Abnormal; Notable for the following components:   Sodium 134 (*)    Glucose, Bld 147 (*)    All other components within normal limits  URINALYSIS, ROUTINE W REFLEX MICROSCOPIC - Abnormal; Notable for the following components:   Color, Urine YELLOW (*)    APPearance CLOUDY (*)    Hgb urine dipstick MODERATE (*)    Ketones, ur 5 (*)    Protein, ur 100 (*)    Bacteria, UA RARE (*)    All other components within normal limits  URINE CULTURE    EKG None  Radiology CT Renal Stone Study  Result Date: 02/14/2023 CLINICAL DATA:  Left-sided flank pain beginning today. EXAM: CT ABDOMEN AND PELVIS WITHOUT CONTRAST TECHNIQUE: Multidetector CT imaging of the abdomen and pelvis was performed following the standard protocol without IV contrast. RADIATION DOSE REDUCTION: This exam was performed according to the departmental dose-optimization program which includes automated exposure control, adjustment of the mA and/or kV according to patient size and/or use of iterative reconstruction technique. COMPARISON:  None Available. FINDINGS: Lower chest: Lung bases are clear. Hepatobiliary: Liver parenchyma is normal without contrast. No calcified gallstones. Pancreas: Normal Spleen: Normal Adrenals/Urinary Tract: Adrenal glands are normal. The right kidney shows parapelvic cysts. No follow-up recommended. No hydronephrosis or stone. The left kidney also shows parapelvic cysts. There is a nonobstructing 2 mm stone in the lower pole and 2 mm stone in the upper pole. There is a 2 mm stone in the proximal ureter, at about the level of the lower pole the kidney, with a degree of hydronephrosis present. No stone distal to that. No stone in the  bladder. Stomach/Bowel: Stomach and small intestine are normal. No acute or significant: Finding. Vascular/Lymphatic: Aortic atherosclerosis. No aneurysm. IVC is normal. No adenopathy. Reproductive: Normal Other: No free fluid or air. Musculoskeletal: Ordinary chronic lumbar degenerative changes. IMPRESSION: 1. 2 mm stone in the proximal left ureter at about the level of the lower pole the kidney, with a mild degree of hydronephrosis presently. 2. Two 2 mm nonobstructing stones in the left kidney. 3. Bilateral parapelvic renal cysts. No follow-up recommended. Aortic Atherosclerosis (ICD10-I70.0). Electronically Signed   By: Paulina Fusi M.D.   On: 02/14/2023 18:45    Procedures Procedures    Medications Ordered  in ED Medications  cephALEXin (KEFLEX) capsule 500 mg (has no administration in time range)  ketorolac (TORADOL) 15 MG/ML injection 15 mg (has no administration in time range)  oxyCODONE-acetaminophen (PERCOCET/ROXICET) 5-325 MG per tablet 1 tablet (has no administration in time range)  HYDROmorphone (DILAUDID) injection 1 mg (1 mg Intravenous Given 02/14/23 1749)  ondansetron (ZOFRAN) injection 4 mg (4 mg Intravenous Given 02/14/23 1748)  ketorolac (TORADOL) 15 MG/ML injection 15 mg (15 mg Intravenous Given 02/14/23 1901)  oxyCODONE-acetaminophen (PERCOCET/ROXICET) 5-325 MG per tablet 1 tablet (1 tablet Oral Given 02/14/23 1900)    ED Course/ Medical Decision Making/ A&P                                 Medical Decision Making Amount and/or Complexity of Data Reviewed Labs: ordered. Radiology: ordered.  Risk Prescription drug management.   62 year old female with 2 mm left kidney stone.  She has some slight elevation in WBCs but no leukocytes or nitrites in the urine.  Urine culture is pending but will cover with cephalexin.  Low-grade temp of 99 but heart rate normal.  Pain well-controlled after Dilaudid Toradol and oxycodone.  She is given IV fluids.  She appears well.  Patient  stable and ready for discharge to home with outpatient follow-up with urology.  She understands signs and symptoms return to the ER for. Final Clinical Impression(s) / ED Diagnoses Final diagnoses:  Flank pain  Kidney stone    Rx / DC Orders ED Discharge Orders          Ordered    cephALEXin (KEFLEX) 500 MG capsule  4 times daily        02/14/23 1948    oxyCODONE (ROXICODONE) 5 MG immediate release tablet  Every 6 hours PRN        02/14/23 1948    ondansetron (ZOFRAN) 4 MG tablet  Daily PRN        02/14/23 1948    ketorolac (TORADOL) 10 MG tablet  Every 8 hours        02/14/23 1948    tamsulosin (FLOMAX) 0.4 MG CAPS capsule  Daily after supper        02/14/23 1948              Ronnette Juniper 02/14/23 1952    Corena Herter, MD 02/15/23 [phone]

## 2023-02-14 NOTE — ED Triage Notes (Signed)
Pt to ED for n/v and left sided flank pain started today. Reports urine darker than normal .

## 2023-02-14 NOTE — Discharge Instructions (Addendum)
Please make sure you are drinking lots of fluids.  Return to the ER for any severe pain, fevers, vomiting, worsening symptoms or any urgent changes in your health.  Call urology to schedule follow-up appointment.

## 2023-02-15 ENCOUNTER — Telehealth: Payer: Self-pay

## 2023-02-15 DIAGNOSIS — N200 Calculus of kidney: Secondary | ICD-10-CM

## 2023-02-15 NOTE — Telephone Encounter (Signed)
Requested Prescriptions  Pending Prescriptions Disp Refills   atorvastatin (LIPITOR) 20 MG tablet [Pharmacy Med Name: Atorvastatin Calcium 20 MG Oral Tablet] 90 tablet 0    Sig: Take 1 tablet by mouth once daily     Cardiovascular:  Antilipid - Statins Failed - 02/14/2023  4:47 PM      Failed - Lipid Panel in normal range within the last 12 months    Cholesterol, Total  Date Value Ref Range Status  08/16/2022 216 (H) 100 - 199 mg/dL Final   LDL Chol Calc (NIH)  Date Value Ref Range Status  08/16/2022 146 (H) 0 - 99 mg/dL Final   HDL  Date Value Ref Range Status  08/16/2022 49 >39 mg/dL Final   Triglycerides  Date Value Ref Range Status  08/16/2022 116 0 - 149 mg/dL Final         Passed - Patient is not pregnant      Passed - Valid encounter within last 12 months    Recent Outpatient Visits           6 months ago Mild intermittent reactive airway disease with acute exacerbation   [facility]   7 months ago Acute cough   [facility]   10 months ago Mild intermittent reactive airway disease with acute exacerbation   [place] [facility]   11 months ago Lower GI bleed   [facility]   1 year ago Annual physical exam   [facility]       Future Appointments             In 2 days [name], MD [facility] at [facility], [facility]

## 2023-02-15 NOTE — Transitions of Care (Post Inpatient/ED Visit) (Signed)
   02/15/2023  Name: Theresa Best MRN: [id number] DOB: [birth year]  Today's TOC FU Call Status: Today's TOC FU Call Status:: Successful TOC FU Call Completed TOC FU Call Complete Date: 02/15/23 Patient's Name and Date of Birth confirmed.  Transition Care Management Follow-up Telephone Call Date of Discharge: 02/14/23 Discharge Facility: [facility]) Type of Discharge: Emergency Department Reason for ED Visit: Other: How have you been since you were released from the hospital?: Same Any questions or concerns?: No  Items Reviewed: Did you receive and understand the discharge instructions provided?: No Medications obtained,verified, and reconciled?: No Any new allergies since your discharge?: No Dietary orders reviewed?: No Do you have support at home?: Yes  Medications Reviewed Today: Medications Reviewed Today   Medications were not reviewed in this encounter     Home Care and Equipment/Supplies: Were Home Health Services Ordered?: No Any new equipment or medical supplies ordered?: No  Functional Questionnaire: Do you need assistance with bathing/showering or dressing?: No Do you need assistance with meal preparation?: No Do you need assistance with eating?: No Do you have difficulty maintaining continence: No Do you need assistance with getting out of bed/getting out of a chair/moving?: No Do you have difficulty managing or taking your medications?: No  Follow up appointments reviewed: PCP Follow-up appointment confirmed?: Yes MD Provider Line [phone] Given: No Specialist Hospital Follow-up appointment confirmed?: NA Do you need transportation to your follow-up appointment?: No Do you understand care options if your condition(s) worsen?: Yes-patient verbalized understanding    SIGNATURE

## 2023-02-16 ENCOUNTER — Ambulatory Visit (INDEPENDENT_AMBULATORY_CARE_PROVIDER_SITE_OTHER): Payer: BC Managed Care – PPO | Admitting: Nurse Practitioner

## 2023-02-16 LAB — URINE CULTURE

## 2023-02-17 ENCOUNTER — Ambulatory Visit: Payer: BC Managed Care – PPO | Admitting: Family Medicine

## 2023-02-25 IMAGING — MG MM DIGITAL SCREENING BILAT W/ TOMO AND CAD
6 of 10 series · 6 of 30 positions shown · non-contrast
Comparison: Previous exam(s).

CLINICAL DATA: Screening.

EXAM:
DIGITAL SCREENING BILATERAL MAMMOGRAM WITH TOMOSYNTHESIS AND CAD
TECHNIQUE: Bilateral screening digital craniocaudal and mediolateral oblique
mammograms were obtained. Bilateral screening digital breast
tomosynthesis was performed. The images were evaluated with
computer-aided detection.

[R CC synth-2D]
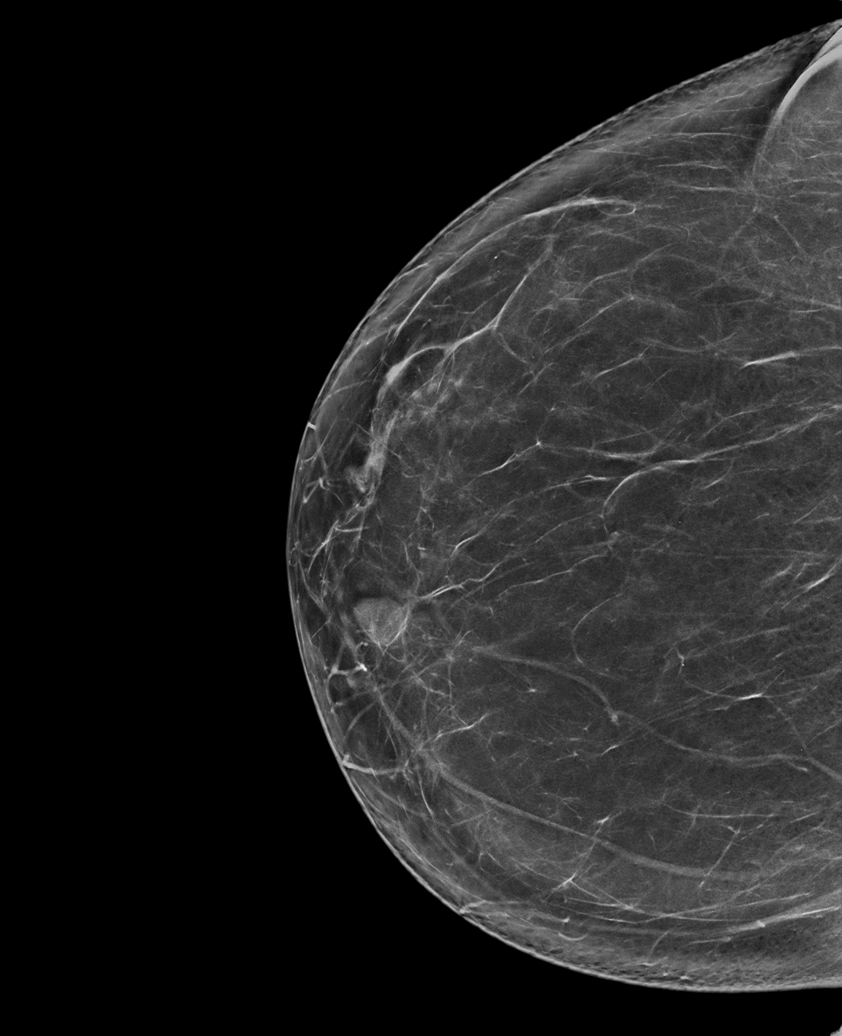

[R MLO synth-2D (1 of 2)]
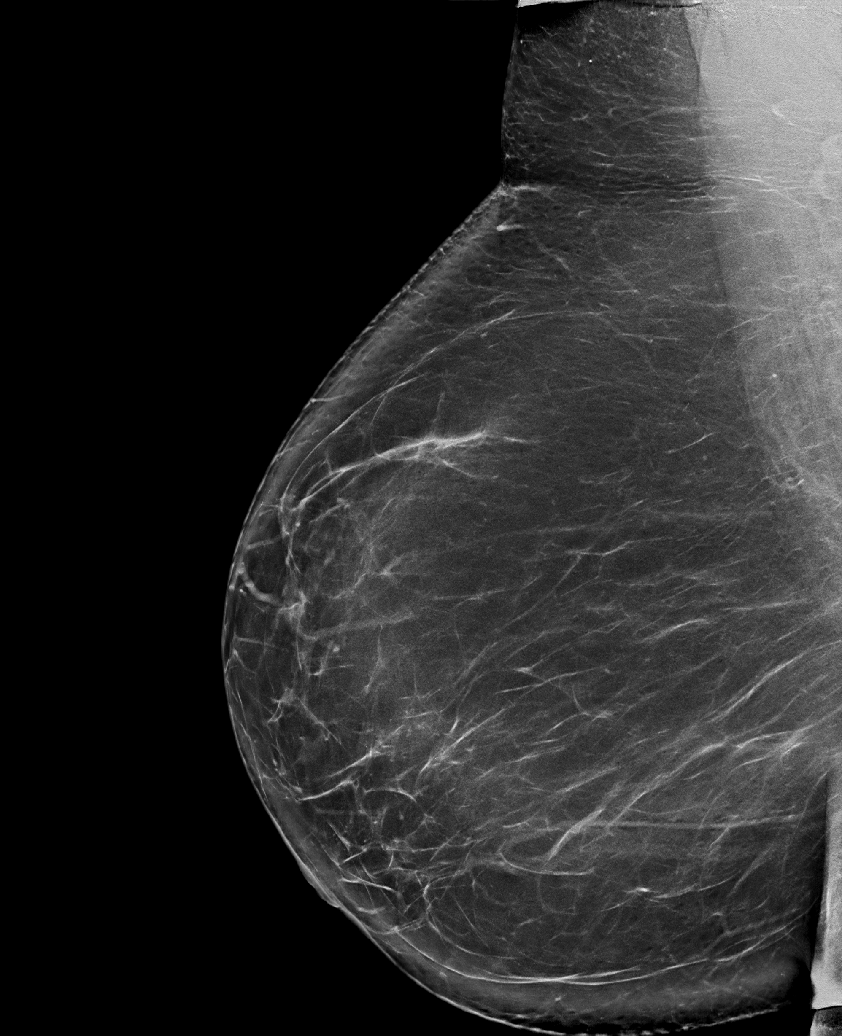

[L CC synth-2D]
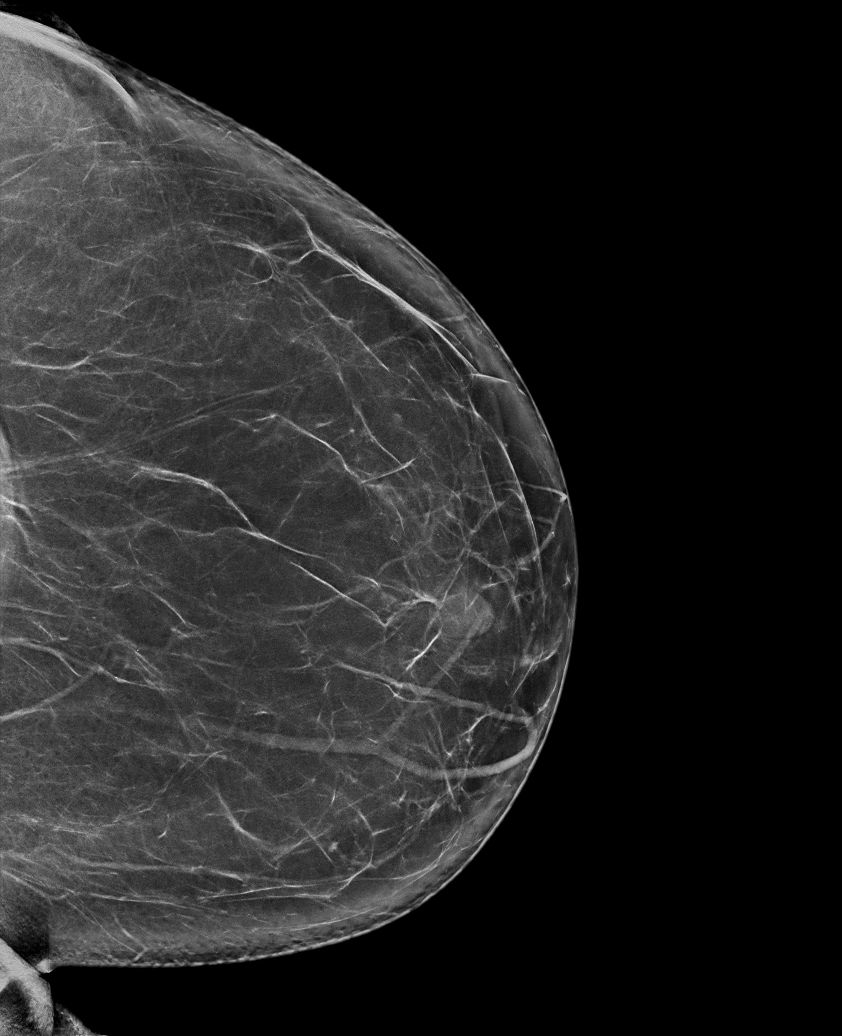

[L MLO synth-2D]
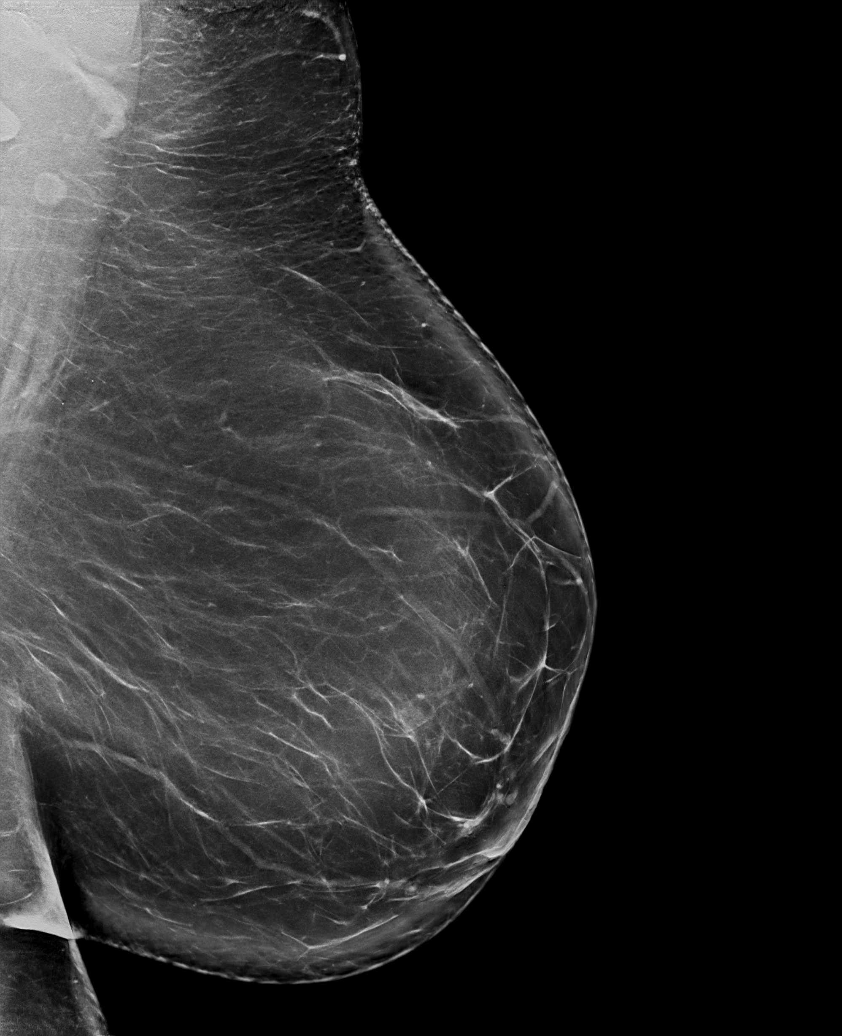

[R MLO synth-2D (2 of 2)]
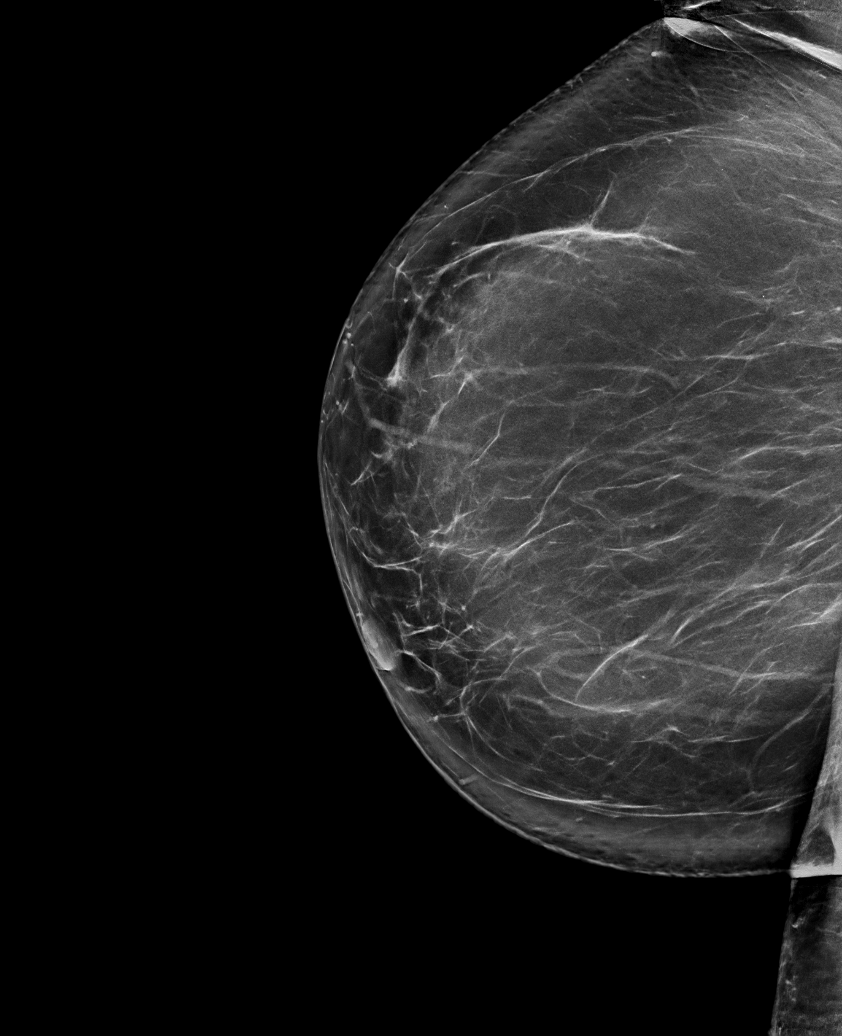

[L CC tomo · tomo slice 45/89.0]
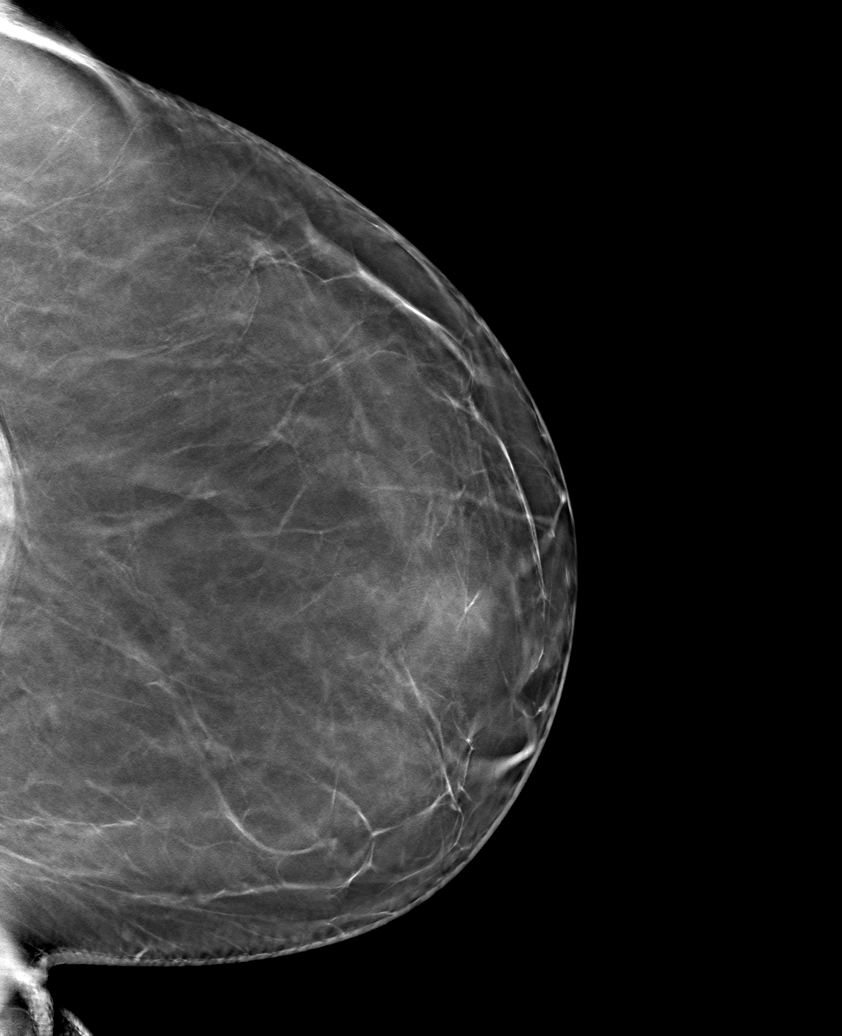

[6 of 30 positions shown; findings below may reference images not displayed]

ACR Breast Density Category b: There are scattered areas of
fibroglandular density.
FINDINGS: There are no findings suspicious for malignancy.
IMPRESSION: No mammographic evidence of malignancy. A result letter of this
screening mammogram will be mailed directly to the patient.

RECOMMENDATION:
Screening mammogram in one year. (Code:51-O-LD2)

BI-RADS CATEGORY  1: Negative.

## 2023-03-10 ENCOUNTER — Other Ambulatory Visit
Admission: RE | Admit: 2023-03-10 | Discharge: 2023-03-10 | Disposition: A | Discharge status: Home / Self Care | Payer: BC Managed Care – PPO | Attending: Urology | Admitting: Urology

## 2023-03-10 ENCOUNTER — Other Ambulatory Visit: Payer: Self-pay

## 2023-03-10 ENCOUNTER — Ambulatory Visit (INDEPENDENT_AMBULATORY_CARE_PROVIDER_SITE_OTHER): Payer: BC Managed Care – PPO | Admitting: Urology

## 2023-03-10 VITALS — BP 189/98 | HR 74 | Ht 66.5 in | Wt 291.1 lb

## 2023-03-10 DIAGNOSIS — N200 Calculus of kidney: Secondary | ICD-10-CM | POA: Diagnosis present

## 2023-03-10 DIAGNOSIS — N201 Calculus of ureter: Secondary | ICD-10-CM

## 2023-03-10 LAB — URINALYSIS, COMPLETE (UACMP) WITH MICROSCOPIC
Bilirubin Urine: NEGATIVE
Glucose, UA: NEGATIVE mg/dL
Hgb urine dipstick: NEGATIVE
Ketones, ur: NEGATIVE mg/dL
Nitrite: NEGATIVE
Protein, ur: NEGATIVE mg/dL
Specific Gravity, Urine: 1.02 (ref 1.005–1.030)
pH: 5.5 (ref 5.0–8.0)

## 2023-03-10 NOTE — Progress Notes (Signed)
Marcelle Overlie Plume,acting as a scribe for Theresa Scotland, MD.,have documented all relevant documentation on the behalf of Theresa Scotland, MD,as directed by  Theresa Scotland, MD while in the presence of Theresa Scotland, MD.  03/10/2023 9:51 AM   Marlan Palau Jan 20, 1961 [id number]  Referring provider: [name], MD [address] [place],  [place] [address]  Chief Complaint  Patient presents with   Establish Care   Nephrolithiasis    HPI: 62 year-old female who presents today for further evaluation of kidney stone event.   She was seen and evaluated in the emergency room on 02/14/2023 with acute left sided flank pain starting that same day. She also had associated nausea and vomiting. No fevers or chills. Her urinalysis showed 21-50 RBC, 6-10 WBC. She had a slight leukocytosis of 12.8, but her creatinine was normal at 0.95, but above her baseline of 0.6. Incidentally, they did send a urine culture at the time of her stone event and she was treated empirically with Keflex but her urine culture was negative   She underwent a CT stone protocol that showed a 2 mm left proximal ureteral stone at the level of the left lower pole with some mild hydronephrosis. She had 2 additional 2 mm non-obstructing stones in the left kidney. I am personally reviewing her scan today.   Her urinalysis today is negative other than a few WBC and many squamous epithelial cells consistent wit a contaminated urinalysis. Incidentally, they did send a urine culture at the time of her stone event and she was treated empirically with Keflex but her urine culture was negative.   Today, she reports that she is no longer taking Flomax, oxycodone, or Zofran. She reports pain last Saturday and Sunday but is currently asymptomatic.She feels as though she passed the stone but has not seen it. She has not had kidney stones in the past.     Results for orders placed or performed during the hospital encounter of  03/10/23  Urinalysis, Complete w Microscopic -  Result Value Ref Range   Color, Urine YELLOW YELLOW   APPearance CLEAR CLEAR   Specific Gravity, Urine 1.020 1.005 - 1.030   pH 5.5 5.0 - 8.0   Glucose, UA NEGATIVE NEGATIVE mg/dL   Hgb urine dipstick NEGATIVE NEGATIVE   Bilirubin Urine NEGATIVE NEGATIVE   Ketones, ur NEGATIVE NEGATIVE mg/dL   Protein, ur NEGATIVE NEGATIVE mg/dL   Nitrite NEGATIVE NEGATIVE   Leukocytes,Ua TRACE (A) NEGATIVE   Squamous Epithelial / HPF 11-20 0 - 5 /HPF   WBC, UA 6-10 0 - 5 WBC/hpf   RBC / HPF 0-5 0 - 5 RBC/hpf   Bacteria, UA FEW (A) NONE SEEN   Urine-Other LESS THAN 10 mL OF URINE SUBMITTED     PMH: Past Medical History:  Diagnosis Date   Hypertension     Home Medications:  Allergies as of 03/10/2023   No Known Allergies      Medication List        Accurate as of March 10, 2023  9:51 AM. If you have any questions, ask your nurse or doctor.          STOP taking these medications    ondansetron 4 MG tablet Commonly known as: Zofran Stopped by: Theresa Best   oxyCODONE 5 MG immediate release tablet Commonly known as: Roxicodone Stopped by: Theresa Best   tamsulosin 0.4 MG Caps capsule Commonly known as: Flomax Stopped by: Theresa Best  TAKE these medications    albuterol (2.5 MG/3ML) 0.083% nebulizer solution Commonly known as: PROVENTIL Take 3 mLs (2.5 mg total) by nebulization every 6 (six) hours as needed for wheezing or shortness of breath.   albuterol 108 (90 Base) MCG/ACT inhaler Commonly known as: VENTOLIN HFA INHALE 2 PUFFS BY MOUTH EVERY 6 HOURS AS NEEDED FOR WHEEZING FOR SHORTNESS OF BREATH   amLODipine 5 MG tablet Commonly known as: NORVASC Take 1 tablet (5 mg total) by mouth daily.   atorvastatin 20 MG tablet Commonly known as: LIPITOR Take 1 tablet by mouth once daily   Azelaic Acid 15 % gel Apply topically every morning.   budesonide-formoterol 80-4.5 MCG/ACT inhaler Commonly known  as: SYMBICORT Inhale 2 puffs into the lungs 2 (two) times daily.   hydrochlorothiazide 12.5 MG tablet Commonly known as: HYDRODIURIL Take 1 tablet (12.5 mg total) by mouth daily.   hydrocortisone 2.5 % ointment Apply topically 2 (two) times daily.   montelukast 10 MG tablet Commonly known as: SINGULAIR Take 1 tablet (10 mg total) by mouth at bedtime.   tretinoin 0.05 % cream Commonly known as: RETIN-A Apply topically at bedtime.        Family History: Family History  Problem Relation Age of Onset   Breast cancer Neg Hx     Social History:  reports that she has never smoked. She has never used smokeless tobacco. She reports that she does not drink alcohol and does not use drugs.   Physical Exam: BP (!) 189/98   Pulse 74   Ht 5' 6.5" (1.689 m)   Wt 291 lb 2 oz (132.1 kg)   BMI 46.28 kg/m   Constitutional:  Alert and oriented, No acute distress. HEENT: [place] AT, moist mucus membranes.  Trachea midline, no masses. Neurologic: Grossly intact, no focal deficits, moving all 4 extremities. Psychiatric: Normal mood and affect.  Pertinent Imaging:     EXAM: CT ABDOMEN AND PELVIS WITHOUT CONTRAST  TECHNIQUE: Multidetector CT imaging of the abdomen and pelvis was performed following the standard protocol without IV contrast.  RADIATION DOSE REDUCTION: This exam was performed according to the departmental dose-optimization program which includes automated exposure control, adjustment of the mA and/or kV according to patient size and/or use of iterative reconstruction technique.  COMPARISON:  None Available.  FINDINGS: Lower chest: Lung bases are clear.  Hepatobiliary: Liver parenchyma is normal without contrast. No calcified gallstones.  Pancreas: Normal  Spleen: Normal  Adrenals/Urinary Tract: Adrenal glands are normal. The right kidney shows parapelvic cysts. No follow-up recommended. No hydronephrosis or stone.  The left kidney also shows parapelvic cysts.  There is a nonobstructing 2 mm stone in the lower pole and 2 mm stone in the upper pole. There is a 2 mm stone in the proximal ureter, at about the level of the lower pole the kidney, with a degree of hydronephrosis present. No stone distal to that. No stone in the bladder.  Stomach/Bowel: Stomach and small intestine are normal. No acute or significant: Finding.  Vascular/Lymphatic: Aortic atherosclerosis. No aneurysm. IVC is normal. No adenopathy.  Reproductive: Normal  Other: No free fluid or air.  Musculoskeletal: Ordinary chronic lumbar degenerative changes.  IMPRESSION: 1. 2 mm stone in the proximal left ureter at about the level of the lower pole the kidney, with a mild degree of hydronephrosis presently. 2. Two 2 mm nonobstructing stones in the left kidney. 3. Bilateral parapelvic renal cysts. No follow-up recommended.  Aortic Atherosclerosis (ICD10-I70.0).   Electronically Signed By: Loraine Leriche  Shogry M.D. On: 02/14/2023 18:45  This was personally reviewed today. The 2 non-obstructing stones are essentially punctate in the left kidney. Otherwise, I agree with the radiologic interpretation.    Assessment & Plan:    1. Kidney stones -Initially presented with small obstructing stone, given the very small size and current absence of symptoms along with reassuring urine, it is highly likely that she is passing intervally.  She will let us know if she has recurrent symptoms. -2 additional punctate nonobstructing stones in the left kidney remaining, would not recommend intervention - We discussed general stone prevention techniques including drinking plenty water with goal of producing 2.5 L urine daily, increased citric acid intake, avoidance of high oxalate containing foods, and decreased salt intake.  Information about dietary recommendations given today.  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 -  Additional information was provided about stone prevention.   Return if symptoms worsen or fail to improve.   [facility] [address] [place], [place] [address] [phone]

## 2023-04-06 ENCOUNTER — Encounter: Payer: Self-pay | Admitting: Physician Assistant

## 2023-04-06 ENCOUNTER — Ambulatory Visit
Admission: RE | Admit: 2023-04-06 | Discharge: 2023-04-06 | Disposition: A | Discharge status: Home / Self Care | Payer: BC Managed Care – PPO | Source: Ambulatory Visit | Attending: Physician Assistant | Admitting: Physician Assistant

## 2023-04-06 ENCOUNTER — Ambulatory Visit
Admission: RE | Admit: 2023-04-06 | Discharge: 2023-04-06 | Disposition: A | Discharge status: Home / Self Care | Payer: BC Managed Care – PPO | Attending: Physician Assistant | Admitting: Physician Assistant

## 2023-04-06 ENCOUNTER — Ambulatory Visit: Payer: BC Managed Care – PPO | Admitting: Physician Assistant

## 2023-04-06 ENCOUNTER — Telehealth: Payer: Self-pay | Admitting: Urology

## 2023-04-06 VITALS — BP 158/95 | HR 86

## 2023-04-06 DIAGNOSIS — N200 Calculus of kidney: Secondary | ICD-10-CM

## 2023-04-06 DIAGNOSIS — R109 Unspecified abdominal pain: Secondary | ICD-10-CM

## 2023-04-06 LAB — MICROSCOPIC EXAMINATION: Epithelial Cells (non renal): >10 /[HPF] — AB (ref 0–10)

## 2023-04-06 LAB — URINALYSIS, COMPLETE
Bilirubin, UA: NEGATIVE
Glucose, UA: NEGATIVE
Ketones, UA: NEGATIVE
Nitrite, UA: NEGATIVE
Protein,UA: NEGATIVE
RBC, UA: NEGATIVE
Specific Gravity, UA: 1.02 (ref 1.005–1.030)
Urobilinogen, Ur: 0.2 mg/dL (ref 0.2–1.0)
pH, UA: 6 (ref 5.0–7.5)

## 2023-04-06 MED ORDER — ONDANSETRON 4 MG PO TBDP
4.0000 mg | ORAL_TABLET | Freq: Three times a day (TID) | ORAL | 0 refills | Status: AC | PRN
Start: 2023-04-06 — End: ?

## 2023-04-06 MED ORDER — KETOROLAC TROMETHAMINE 60 MG/2ML IM SOLN
15.0000 mg | Freq: Once | INTRAMUSCULAR | Status: AC
Start: 2023-04-06 — End: 2023-04-06
  Administered 2023-04-06: 15 mg via INTRAMUSCULAR

## 2023-04-06 MED ORDER — OXYCODONE-ACETAMINOPHEN 5-325 MG PO TABS
1.0000 | ORAL_TABLET | Freq: Four times a day (QID) | ORAL | 0 refills | Status: AC | PRN
Start: 2023-04-06 — End: 2023-04-11

## 2023-04-06 MED ORDER — TAMSULOSIN HCL 0.4 MG PO CAPS
0.4000 mg | ORAL_CAPSULE | Freq: Every day | ORAL | 0 refills | Status: DC
Start: 2023-04-06 — End: 2024-04-24

## 2023-04-06 MED ORDER — KETOROLAC TROMETHAMINE 10 MG PO TABS: 10.0000 mg | ORAL_TABLET | Freq: Four times a day (QID) | ORAL | 0 refills | Status: DC | PRN

## 2023-04-06 NOTE — Progress Notes (Signed)
04/06/2023 4:42 PM   Theresa Best [birth year] [id number]  CC: Chief Complaint  Patient presents with   Follow-up   HPI: Theresa Best is a 62 y.o. female with PMH nephrolithiasis who presents today for evaluation of possible acute stone episode.  She was seen in clinic most recently on 03/10/2023 by Dr. Apolinar Junes with a recent history of left renal colic secondary to a 2 mm proximal left ureteral stone.  There were 2 additional nonobstructing 2 mm stones in the left kidney.  She thought that she had passed the ureteral stone at that time.  Today she reports a 2-day history of intermittent severe left flank pain with gross hematuria this morning.  KUB today with no radiopaque urolithiasis, however interpretation is challenging due to overlying bowel.  In-office UA today positive for trace leukocytes; urine microscopy with 6-10 WBCs/HPF, >10 epithelial cells/hpf, and moderate bacteria.   PMH: Past Medical History:  Diagnosis Date   Hypertension     Surgical History: No past surgical history on file.  Home Medications:  Allergies as of 04/06/2023   No Known Allergies      Medication List        Accurate as of April 06, 2023  4:42 PM. If you have any questions, ask your nurse or doctor.          albuterol (2.5 MG/3ML) 0.083% nebulizer solution Commonly known as: PROVENTIL Take 3 mLs (2.5 mg total) by nebulization every 6 (six) hours as needed for wheezing or shortness of breath.   albuterol 108 (90 Base) MCG/ACT inhaler Commonly known as: VENTOLIN HFA INHALE 2 PUFFS BY MOUTH EVERY 6 HOURS AS NEEDED FOR WHEEZING FOR SHORTNESS OF BREATH   amLODipine 5 MG tablet Commonly known as: NORVASC Take 1 tablet (5 mg total) by mouth daily.   atorvastatin 20 MG tablet Commonly known as: LIPITOR Take 1 tablet by mouth once daily   Azelaic Acid 15 % gel Apply topically every morning.   budesonide-formoterol 80-4.5 MCG/ACT inhaler Commonly known as:  SYMBICORT Inhale 2 puffs into the lungs 2 (two) times daily.   hydrochlorothiazide 12.5 MG tablet Commonly known as: HYDRODIURIL Take 1 tablet (12.5 mg total) by mouth daily.   hydrocortisone 2.5 % ointment Apply topically 2 (two) times daily.   ketorolac 10 MG tablet Commonly known as: TORADOL Take 1 tablet (10 mg total) by mouth every 6 (six) hours as needed. Started by: Carman Ching   montelukast 10 MG tablet Commonly known as: SINGULAIR Take 1 tablet (10 mg total) by mouth at bedtime.   ondansetron 4 MG disintegrating tablet Commonly known as: ZOFRAN-ODT Take 1 tablet (4 mg total) by mouth every 8 (eight) hours as needed for nausea or vomiting. Started by: Carman Ching   oxyCODONE-acetaminophen 5-325 MG tablet Commonly known as: PERCOCET/ROXICET Take 1-2 tablets by mouth every 6 (six) hours as needed for up to 5 days for severe pain (pain score 7-10). Started by: Carman Ching   tamsulosin 0.4 MG Caps capsule Commonly known as: FLOMAX Take 1 capsule (0.4 mg total) by mouth daily. Started by: Carman Ching   tretinoin 0.05 % cream Commonly known as: RETIN-A Apply topically at bedtime.        Allergies:  No Known Allergies  Family History: Family History  Problem Relation Age of Onset   Breast cancer Neg Hx     Social History:   reports that she has never smoked. She has never used smokeless tobacco. She reports that she does  not drink alcohol and does not use drugs.  Physical Exam: BP (!) 158/95   Pulse 86   Constitutional:  Alert and oriented, no acute distress, nontoxic appearing HEENT: [place], AT Cardiovascular: No clubbing, cyanosis, or edema Respiratory: Normal respiratory effort, no increased work of breathing Skin: No rashes, bruises or suspicious lesions Neurologic: Grossly intact, no focal deficits, moving all 4 extremities Psychiatric: Normal mood and affect  Laboratory Data: Results for orders placed or  performed in visit on 04/06/23  Microscopic Examination   Urine  Result Value Ref Range   WBC, UA 6-10 (A) 0 - 5 /hpf   RBC, Urine 0-2 0 - 2 /hpf   Epithelial Cells (non renal) >10 (A) 0 - 10 /hpf   Bacteria, UA Moderate (A) None seen/Few  Urinalysis, Complete  Result Value Ref Range   Specific Gravity, UA 1.020 1.005 - 1.030   pH, UA 6.0 5.0 - 7.5   Color, UA Yellow Yellow   Appearance Ur Hazy (A) Clear   Leukocytes,UA Trace (A) Negative   Protein,UA Negative Negative/Trace   Glucose, UA Negative Negative   Ketones, UA Negative Negative   RBC, UA Negative Negative   Bilirubin, UA Negative Negative   Urobilinogen, Ur 0.2 0.2 - 1.0 mg/dL   Nitrite, UA Negative Negative   Microscopic Examination See below:    Pertinent Imaging: KUB, 04/06/2023: See Epic  I personally reviewed the images referenced above and note no radiopaque urolithiasis.  Assessment & Plan:   1. Flank pain with history of urolithiasis Severe left flank pain and gross hematuria consistent with possible new versus persistent acute stone episode on the left side.  UA appears contaminated, low suspicion for UTI at this time.  Unfortunately, I cannot see the stone on x-ray today, however we discussed that stones can frequently be too small to appear on x-ray.  Given known stone size, the odds are strongly in her favor that she would be able to spontaneously pass these on her own.  I encouraged her to pursue a trial of passage today.  We gave her a shot of IM Toradol and I am prescribing Flomax, oral Toradol, Zofran, and Percocet to help her pass her stone.  I will see her back in 3 weeks with a CT stone study prior.  She is interested in pursuing a CT stone study today, however I recommended against it because I did not think it would change management.  She was ultimately in agreement with this plan.  We discussed return precautions including fever, uncontrolled pain, and uncontrolled nausea/vomiting. -  Urinalysis, Complete - ketorolac (TORADOL) injection 15 mg - CULTURE, URINE COMPREHENSIVE - ondansetron (ZOFRAN-ODT) 4 MG disintegrating tablet; Take 1 tablet (4 mg total) by mouth every 8 (eight) hours as needed for nausea or vomiting.  Dispense: 20 tablet; Refill: 0 - oxyCODONE-acetaminophen (PERCOCET/ROXICET) 5-325 MG tablet; Take 1-2 tablets by mouth every 6 (six) hours as needed for up to 5 days for severe pain (pain score 7-10).  Dispense: 14 tablet; Refill: 0 - tamsulosin (FLOMAX) 0.4 MG CAPS capsule; Take 1 capsule (0.4 mg total) by mouth daily.  Dispense: 30 capsule; Refill: 0 - ketorolac (TORADOL) 10 MG tablet; Take 1 tablet (10 mg total) by mouth every 6 (six) hours as needed.  Dispense: 19 tablet; Refill: 0 - CT RENAL STONE STUDY; Future   Return in about 3 weeks (around 04/27/2023) for Stone follow up with CT prior and UA.  Carman Ching, PA-C  [facility] [place] [phone]  [address] [place], [place] [address] [phone]

## 2023-04-06 NOTE — Telephone Encounter (Signed)
Does patient need another office visit?

## 2023-04-06 NOTE — Progress Notes (Signed)
IM Injection  Patient is present today for an IM Injection for treatment of kidney stone pain Drug: ketorolac Dose:15mg  Location:[facility] Lot: [id number] Exp:01/19/2024 Patient tolerated well, no complications were noted  Performed by: Randa Lynn, RMA  Additional notes/ Follow up: 3 week follow up

## 2023-04-06 NOTE — Telephone Encounter (Signed)
She was seen almost a month ago and at that point in time, had likely passed her small obstructing stone.  She needs another evaluation/office visit with KUB before we can prescribe.  Vanna Scotland, MD

## 2023-04-06 NOTE — Patient Instructions (Addendum)
I'll see you back in clinic in 3 weeks with a CT scan prior. In the meantime, please do the following: -Stay well hydrated -Take Flomax 0.4mg  daily -Treat any pain with the following medications: -Tylenol (acetaminophen) OR Percocet -Advil (ibuprofen) OR Toradol (ketorolac) -Treat any nausea with Zofran  I will plan to see you back in clinic in 3 weeks with a CT scan prior to see if you have passed your stone.  Please call our office immediately (we are open 8a-5p Monday-Friday) or go to the Emergency Department if you develop any of the following: -Fever/chills -Nausea and/or vomiting uncontrollable with Zofran -Pain uncontrollable with Percocet

## 2023-04-06 NOTE — Telephone Encounter (Signed)
Sam will see patient today at 2:15 pm with KUB prior and UA check. Patient was advised.

## 2023-04-06 NOTE — Telephone Encounter (Signed)
Patient called the office today with complaint of pain from two 2mm kidney stones that she believes that she is trying to pass.   She is requesting a refill of the oxycodone 5mg  and the ketorolac that she was given in the ED to [facility] [address] in [place].  Please contact patient to advise.

## 2023-04-09 LAB — CULTURE, URINE COMPREHENSIVE

## 2023-04-10 DIAGNOSIS — R109 Unspecified abdominal pain: Secondary | ICD-10-CM

## 2023-04-27 ENCOUNTER — Telehealth: Payer: Self-pay

## 2023-04-27 ENCOUNTER — Ambulatory Visit: Payer: BC Managed Care – PPO | Admitting: Physician Assistant

## 2023-04-27 NOTE — Telephone Encounter (Signed)
she's on my schedule this afternoon for CT prior, but it hasn't been scheduled yet. can you please call her, give her the number for imaging so she can call and get it booked, and offer to postpone her appt until after the scan? [facility] dept is [phone]  LVM for pt to return call.

## 2023-04-27 NOTE — Telephone Encounter (Signed)
Error

## 2023-04-27 NOTE — Telephone Encounter (Signed)
2nd attempt to reach pt, LVM for pt to return call

## 2023-04-27 NOTE — Telephone Encounter (Signed)
Patient came in office regarding vm that was left. I gave her phone number for scheduling to call and schedule her CT. She will call back to r/s her appt with Sam after scheduling CT.

## 2023-04-28 ENCOUNTER — Encounter: Payer: Self-pay | Admitting: Physician Assistant

## 2023-04-28 ENCOUNTER — Other Ambulatory Visit: Payer: Self-pay | Admitting: Physician Assistant

## 2023-04-28 ENCOUNTER — Ambulatory Visit
Admission: RE | Admit: 2023-04-28 | Discharge: 2023-04-28 | Disposition: A | Discharge status: Home / Self Care | Payer: BC Managed Care – PPO | Source: Ambulatory Visit | Attending: Physician Assistant | Admitting: Physician Assistant

## 2023-04-28 ENCOUNTER — Ambulatory Visit: Payer: BC Managed Care – PPO | Admitting: Physician Assistant

## 2023-04-28 VITALS — BP 166/93 | HR 87 | Ht 66.5 in | Wt 280.0 lb

## 2023-04-28 DIAGNOSIS — R109 Unspecified abdominal pain: Secondary | ICD-10-CM | POA: Diagnosis present

## 2023-04-28 DIAGNOSIS — N200 Calculus of kidney: Secondary | ICD-10-CM

## 2023-04-28 DIAGNOSIS — Z87442 Personal history of urinary calculi: Secondary | ICD-10-CM | POA: Insufficient documentation

## 2023-04-28 DIAGNOSIS — Z09 Encounter for follow-up examination after completed treatment for conditions other than malignant neoplasm: Secondary | ICD-10-CM

## 2023-04-28 LAB — URINALYSIS, COMPLETE
Bilirubin, UA: NEGATIVE
Glucose, UA: NEGATIVE
Ketones, UA: NEGATIVE
Nitrite, UA: NEGATIVE
Protein,UA: NEGATIVE
RBC, UA: NEGATIVE
Specific Gravity, UA: 1.025 (ref 1.005–1.030)
Urobilinogen, Ur: 0.2 mg/dL (ref 0.2–1.0)
pH, UA: 5 (ref 5.0–7.5)

## 2023-04-28 LAB — MICROSCOPIC EXAMINATION: Epithelial Cells (non renal): >10 /[HPF] — AB (ref 0–10)

## 2023-04-28 NOTE — Progress Notes (Signed)
04/28/2023 1:22 PM   Theresa Best Nov 15, 1960 [id number]  CC: Chief Complaint  Patient presents with   Flank Pain   HPI: Theresa Best is a 62 y.o. female with PMH nephrolithiasis who presents today for follow-up of an acute stone episode.   I saw her in clinic most recently on 04/06/2023 with reports of left flank pain and gross hematuria.  No radiopaque stones on KUB, however recent CT had been notable for two 2 mm left renal stones.  I started her on Flomax, Toradol, Percocet, and Zofran for an acute stone episode.  4 days later, she sent me a MyChart message reporting that she had passed the stone.  Today she reports she was unable to capture her stone and has had persistent midline low back pain since.  CT stone study this morning has not been read by radiology, however I have personally reviewed the scan and note no ureteral stones.  She has a nonobstructing 2 mm left lower pole stone.  In-office UA today positive for trace leukocytes; urine microscopy with 6-10 WBCs/HPF and >10 epithelial cells/hpf.  PMH: Past Medical History:  Diagnosis Date   Hypertension     Surgical History: History reviewed. No pertinent surgical history.  Home Medications:  Allergies as of 04/28/2023   No Known Allergies      Medication List        Accurate as of April 28, 2023  1:22 PM. If you have any questions, ask your nurse or doctor.          albuterol (2.5 MG/3ML) 0.083% nebulizer solution Commonly known as: PROVENTIL Take 3 mLs (2.5 mg total) by nebulization every 6 (six) hours as needed for wheezing or shortness of breath.   albuterol 108 (90 Base) MCG/ACT inhaler Commonly known as: VENTOLIN HFA INHALE 2 PUFFS BY MOUTH EVERY 6 HOURS AS NEEDED FOR WHEEZING FOR SHORTNESS OF BREATH   amLODipine 5 MG tablet Commonly known as: NORVASC Take 1 tablet (5 mg total) by mouth daily.   atorvastatin 20 MG tablet Commonly known as: LIPITOR Take 1 tablet by mouth once  daily   Azelaic Acid 15 % gel Apply topically every morning.   budesonide-formoterol 80-4.5 MCG/ACT inhaler Commonly known as: SYMBICORT Inhale 2 puffs into the lungs 2 (two) times daily.   hydrochlorothiazide 12.5 MG tablet Commonly known as: HYDRODIURIL Take 1 tablet (12.5 mg total) by mouth daily.   hydrocortisone 2.5 % ointment Apply topically 2 (two) times daily.   ketorolac 10 MG tablet Commonly known as: TORADOL Take 1 tablet (10 mg total) by mouth every 6 (six) hours as needed.   montelukast 10 MG tablet Commonly known as: SINGULAIR Take 1 tablet (10 mg total) by mouth at bedtime.   ondansetron 4 MG disintegrating tablet Commonly known as: ZOFRAN-ODT Take 1 tablet (4 mg total) by mouth every 8 (eight) hours as needed for nausea or vomiting.   tamsulosin 0.4 MG Caps capsule Commonly known as: FLOMAX Take 1 capsule (0.4 mg total) by mouth daily.   tretinoin 0.05 % cream Commonly known as: RETIN-A Apply topically at bedtime.        Allergies:  No Known Allergies  Family History: Family History  Problem Relation Age of Onset   Breast cancer Neg Hx     Social History:   reports that she has never smoked. She has never used smokeless tobacco. She reports that she does not drink alcohol and does not use drugs.  Physical Exam: BP Marland Kitchen)  166/93   Pulse 87   Ht 5' 6.5" (1.689 m)   Wt 280 lb (127 kg)   BMI 44.52 kg/m   Constitutional:  Alert and oriented, no acute distress, nontoxic appearing HEENT: [place], AT Cardiovascular: No clubbing, cyanosis, or edema Respiratory: Normal respiratory effort, no increased work of breathing Skin: No rashes, bruises or suspicious lesions Neurologic: Grossly intact, no focal deficits, moving all 4 extremities Psychiatric: Normal mood and affect  Laboratory Data: Results for orders placed or performed in visit on 04/28/23  Microscopic Examination   Urine  Result Value Ref Range   WBC, UA 6-10 (A) 0 - 5 /hpf   RBC, Urine  0-2 0 - 2 /hpf   Epithelial Cells (non renal) >10 (A) 0 - 10 /hpf   Bacteria, UA Few None seen/Few  Urinalysis, Complete  Result Value Ref Range   Specific Gravity, UA 1.025 1.005 - 1.030   pH, UA 5.0 5.0 - 7.5   Color, UA Yellow Yellow   Appearance Ur Clear Clear   Leukocytes,UA Trace (A) Negative   Protein,UA Negative Negative/Trace   Glucose, UA Negative Negative   Ketones, UA Negative Negative   RBC, UA Negative Negative   Bilirubin, UA Negative Negative   Urobilinogen, Ur 0.2 0.2 - 1.0 mg/dL   Nitrite, UA Negative Negative   Microscopic Examination See below:    Pertinent Imaging: CT stone study, 04/28/2023: CLINICAL DATA:  Abdominal pain, flank pain. Stone suspected. Reported passage of renal stone 2 and half weeks ago.   EXAM: CT ABDOMEN AND PELVIS WITHOUT CONTRAST   TECHNIQUE: Multidetector CT imaging of the abdomen and pelvis was performed following the standard protocol without IV contrast.   RADIATION DOSE REDUCTION: This exam was performed according to the departmental dose-optimization program which includes automated exposure control, adjustment of the mA and/or kV according to patient size and/or use of iterative reconstruction technique.   COMPARISON:  CT February 14, 2023   FINDINGS: Lower chest: No acute abnormality.   Hepatobiliary: Unremarkable noncontrast enhanced appearance of the hepatic parenchyma. Gallbladder is unremarkable. No biliary ductal dilation.   Pancreas: No pancreatic ductal dilation or evidence of acute inflammation.   Spleen: No splenomegaly.   Adrenals/Urinary Tract: Bilateral adrenal glands appear normal. Bilateral renal sinus cysts are unchanged from prior examination. Punctate nonobstructive left renal stones measure up to 1-2 mm. No obstructive ureteral or bladder calculi. Urinary bladder is unremarkable for degree of distension.   Stomach/Bowel: No radiopaque enteric contrast material was administered. No pathologic  dilation of small or large bowel. Normal appendix. No evidence of acute bowel inflammation.   Vascular/Lymphatic: Normal caliber abdominal aorta. Aortic atherosclerosis. Smooth IVC contours. No pathologically enlarged abdominal or pelvic lymph nodes.   Reproductive: Status post hysterectomy. No adnexal masses.   Other: No significant abdominopelvic free fluid.   Musculoskeletal: Similar asymmetric sclerosis of the right SI joint. Multilevel degenerative change of the spine.   IMPRESSION: 1. Punctate nonobstructive left renal stones measure up to 1-2 mm. No obstructive ureteral or bladder calculi. 2. Similar asymmetric sclerosis of the right SI joint, which may reflect sacroiliitis. 3.  Aortic Atherosclerosis (ICD10-I70.0).     Electronically Signed   By: Maudry Mayhew M.D.   On: 05/02/2023 16:21  I personally reviewed the images referenced above and note no ureteral stones.  Assessment & Plan:   1. Kidney stones No ureteral stones on KUB, suspect acute stone episode has resolved.  No microscopic hematuria on UA today.  There is mild pyuria,  though the sample appears contaminated.  Overall low suspicion for UTI, though will send for culture given reports of persistent low back pain after stone passage.  We discussed that I suspect that this is bladder irritation secondary to stone passage and will resolve on its own.  She expressed understanding.  She prefers to follow-up with [place] annually with KUB prior. - Urinalysis, Complete - Abdomen 1 view (KUB); Future - CULTURE, URINE COMPREHENSIVE   Return in about 1 year (around 04/27/2024) for Annual stone visit with KUB prior.  Carman Ching, PA-C  [facility]  [address], [address] [place], [place] [address] [phone]

## 2023-05-01 LAB — CULTURE, URINE COMPREHENSIVE

## 2023-05-01 MED ORDER — AMOXICILLIN 875 MG PO TABS
875.0000 mg | ORAL_TABLET | Freq: Two times a day (BID) | ORAL | 0 refills | Status: AC
Start: 2023-05-01 — End: 2023-05-06

## 2023-05-11 ENCOUNTER — Telehealth: Payer: Self-pay

## 2023-05-11 MED ORDER — AMOXICILLIN-POT CLAVULANATE 875-125 MG PO TABS: 1.0000 | ORAL_TABLET | Freq: Two times a day (BID) | ORAL | 0 refills | Status: DC

## 2023-05-11 NOTE — Telephone Encounter (Signed)
Patient advised and medication sent [facility] to [facility] [address]

## 2023-05-11 NOTE — Telephone Encounter (Signed)
Patient left message stating she feels like she has a UTI, per recent urine culture results Sam stated patient could get antibiotics if she was having symptoms. Patient states it hurts and burns to urinate and would like a call back to be advised on the medication.

## 2023-05-29 ENCOUNTER — Ambulatory Visit (INDEPENDENT_AMBULATORY_CARE_PROVIDER_SITE_OTHER): Payer: BC Managed Care – PPO | Admitting: Family Medicine

## 2023-05-29 ENCOUNTER — Encounter: Payer: Self-pay | Admitting: Family Medicine

## 2023-05-29 VITALS — BP 130/82 | HR 76 | Ht 66.5 in | Wt 287.0 lb

## 2023-05-29 DIAGNOSIS — E782 Mixed hyperlipidemia: Secondary | ICD-10-CM

## 2023-05-29 DIAGNOSIS — I10 Essential (primary) hypertension: Secondary | ICD-10-CM

## 2023-05-29 DIAGNOSIS — J4521 Mild intermittent asthma with (acute) exacerbation: Secondary | ICD-10-CM | POA: Diagnosis not present

## 2023-05-29 DIAGNOSIS — J0100 Acute maxillary sinusitis, unspecified: Secondary | ICD-10-CM

## 2023-05-29 MED ORDER — AMLODIPINE BESYLATE 5 MG PO TABS
5.0000 mg | ORAL_TABLET | Freq: Every day | ORAL | 0 refills | Status: DC
Start: 2023-05-29 — End: 2023-08-22

## 2023-05-29 MED ORDER — MONTELUKAST SODIUM 10 MG PO TABS
10.0000 mg | ORAL_TABLET | Freq: Every day | ORAL | Status: DC
Start: 2023-05-29 — End: 2023-11-14

## 2023-05-29 MED ORDER — AMOXICILLIN-POT CLAVULANATE 875-125 MG PO TABS: 1.0000 | ORAL_TABLET | Freq: Two times a day (BID) | ORAL | 0 refills | Status: DC

## 2023-05-29 MED ORDER — HYDROCHLOROTHIAZIDE 12.5 MG PO TABS
12.5000 mg | ORAL_TABLET | Freq: Every day | ORAL | 0 refills | Status: DC
Start: 2023-05-29 — End: 2023-08-22

## 2023-05-29 MED ORDER — ALBUTEROL SULFATE (2.5 MG/3ML) 0.083% IN NEBU
2.5000 mg | INHALATION_SOLUTION | Freq: Four times a day (QID) | RESPIRATORY_TRACT | 1 refills | Status: DC | PRN
Start: 2023-05-29 — End: 2023-11-14

## 2023-05-29 MED ORDER — ATORVASTATIN CALCIUM 20 MG PO TABS
20.0000 mg | ORAL_TABLET | Freq: Every day | ORAL | 0 refills | Status: DC
Start: 2023-05-29 — End: 2023-08-26

## 2023-05-29 MED ORDER — ALBUTEROL SULFATE HFA 108 (90 BASE) MCG/ACT IN AERS
2.0000 | INHALATION_SPRAY | Freq: Four times a day (QID) | RESPIRATORY_TRACT | 11 refills | Status: DC | PRN
Start: 2023-05-29 — End: 2023-11-14

## 2023-05-29 NOTE — Progress Notes (Signed)
Date:  05/29/2023   Name:  Theresa Best   DOB:  04-25-1961   MRN:  [id number]   Chief Complaint: Hypertension, Hyperlipidemia, Allergies, and Cough (X 2 weeks, green mucous, runny nose)  Cough This is a new problem. The current episode started 1 to 4 weeks ago (2-3 weeks). The problem has been unchanged. The problem occurs constantly. The cough is Productive of purulent sputum (productive green). Associated symptoms include nasal congestion, postnasal drip, rhinorrhea and wheezing. Pertinent negatives include no chest pain, chills, ear congestion, ear pain, fever, headaches, hemoptysis, sore throat, shortness of breath, sweats or weight loss. She has tried a beta-agonist inhaler for the symptoms. The treatment provided mild relief. Her past medical history is significant for asthma. There is no history of bronchitis.  Sinusitis This is a new problem. The current episode started in the past 7 days. The problem has been gradually worsening since onset. There has been no fever. Associated symptoms include congestion and coughing. Pertinent negatives include no chills, diaphoresis, ear pain, headaches, shortness of breath, sinus pressure or sore throat. The treatment provided mild relief.    Lab Results  Component Value Date   NA 134 (L) 02/14/2023   K 4.1 02/14/2023   CO2 23 02/14/2023   GLUCOSE 147 (H) 02/14/2023   BUN 22 02/14/2023   CREATININE 0.95 02/14/2023   CALCIUM 9.6 02/14/2023   EGFR 99 08/16/2022   GFRNONAA >60 02/14/2023   Lab Results  Component Value Date   CHOL 216 (H) 08/16/2022   HDL 49 08/16/2022   LDLCALC 146 (H) 08/16/2022   TRIG 116 08/16/2022   CHOLHDL 5.8 (H) 01/29/2021   No results found for: "TSH" No results found for: "HGBA1C" Lab Results  Component Value Date   WBC 12.8 (H) 02/14/2023   HGB 14.8 02/14/2023   HCT 45.6 02/14/2023   MCV 89.2 02/14/2023   PLT 298 02/14/2023   Lab Results  Component Value Date   ALT 16 08/16/2022   AST 16  08/16/2022   ALKPHOS 134 (H) 08/16/2022   BILITOT 0.6 08/16/2022   No results found for: "25OHVITD2", "25OHVITD3", "VD25OH"   Review of Systems  Constitutional:  Negative for chills, diaphoresis, fever and weight loss.  HENT:  Positive for congestion, postnasal drip and rhinorrhea. Negative for ear pain, sinus pressure and sore throat.   Respiratory:  Positive for cough and wheezing. Negative for hemoptysis, chest tightness, shortness of breath and stridor.   Cardiovascular:  Negative for chest pain, palpitations and leg swelling.  Neurological:  Negative for headaches.    Patient Active Problem List   Diagnosis Date Noted   Baker's cyst of knee, left 07/04/2022   Lymphedema 06/30/2022   Chronic venous insufficiency 03/19/2022   Varicose veins of right lower extremity with pain 07/22/2019   Familial hypercholesterolemia 07/22/2019   Uterine prolapse 08/16/2018   SUI (stress urinary incontinence, female) 08/16/2018   POP-Q stage 2 cystocele 08/16/2018   Essential hypertension 11/03/2016   Class 3 severe obesity due to excess calories with serious comorbidity and body mass index (BMI) of 40.0 to 44.9 in adult (HCC) 11/03/2016    No Known Allergies  History reviewed. No pertinent surgical history.  Social History   Tobacco Use   Smoking status: Never   Smokeless tobacco: Never  Substance Use Topics   Alcohol use: No    Alcohol/week: 0.0 standard drinks of alcohol   Drug use: No     Medication list has been reviewed and updated.  Current Meds  Medication Sig   albuterol (PROVENTIL) (2.5 MG/3ML) 0.083% nebulizer solution Take 3 mLs (2.5 mg total) by nebulization every 6 (six) hours as needed for wheezing or shortness of breath.   albuterol (VENTOLIN HFA) 108 (90 Base) MCG/ACT inhaler INHALE 2 PUFFS BY MOUTH EVERY 6 HOURS AS NEEDED FOR WHEEZING FOR SHORTNESS OF BREATH   amLODipine (NORVASC) 5 MG tablet Take 1 tablet (5 mg total) by mouth daily.   atorvastatin (LIPITOR) 20  MG tablet Take 1 tablet by mouth once daily   Azelaic Acid 15 % gel Apply topically every morning.   hydrochlorothiazide (HYDRODIURIL) 12.5 MG tablet Take 1 tablet (12.5 mg total) by mouth daily.   hydrocortisone 2.5 % ointment Apply topically 2 (two) times daily.   montelukast (SINGULAIR) 10 MG tablet Take 1 tablet (10 mg total) by mouth at bedtime.   ondansetron (ZOFRAN-ODT) 4 MG disintegrating tablet Take 1 tablet (4 mg total) by mouth every 8 (eight) hours as needed for nausea or vomiting.   tamsulosin (FLOMAX) 0.4 MG CAPS capsule Take 1 capsule (0.4 mg total) by mouth daily.   tretinoin (RETIN-A) 0.05 % cream Apply topically at bedtime.   [DISCONTINUED] amoxicillin-clavulanate (AUGMENTIN) 875-125 MG tablet Take 1 tablet by mouth 2 (two) times daily.   [DISCONTINUED] budesonide-formoterol (SYMBICORT) 80-4.5 MCG/ACT inhaler Inhale 2 puffs into the lungs 2 (two) times daily.       05/29/2023    4:40 PM 08/16/2022    3:00 PM 04/05/2022    4:28 PM 02/23/2022   11:07 AM  GAD 7 : Generalized Anxiety Score  Nervous, Anxious, on Edge 0 0 0 0  Control/stop worrying 0 0 0 1  Worry too much - different things 0 0 0 1  Trouble relaxing 0 0 0 0  Restless 0 0 0 0  Easily annoyed or irritable 0 0 0 0  Afraid - awful might happen 0 0 0 0  Total GAD 7 Score 0 0 0 2  Anxiety Difficulty Not difficult at all Not difficult at all Not difficult at all Not difficult at all       05/29/2023    4:40 PM 08/16/2022    3:00 PM 04/05/2022    4:28 PM  Depression screen PHQ 2/9  Decreased Interest 0 0 0  Down, Depressed, Hopeless 0 0 0  PHQ - 2 Score 0 0 0  Altered sleeping 0 0 0  Tired, decreased energy 0 0 0  Change in appetite 0 0 0  Feeling bad or failure about yourself  0 0 0  Trouble concentrating 0 0 0  Moving slowly or fidgety/restless 0 0 0  Suicidal thoughts 0 0 0  PHQ-9 Score 0 0 0  Difficult doing work/chores Not difficult at all Not difficult at all Not difficult at all    BP Readings  from Last 3 Encounters:  05/29/23 130/82  04/28/23 (!) 166/93  04/06/23 (!) 158/95    Physical Exam Vitals reviewed.  HENT:     Right Ear: Tympanic membrane, ear canal and external ear normal. There is no impacted cerumen.     Left Ear: Tympanic membrane, ear canal and external ear normal. There is no impacted cerumen.     Nose: Nose normal. No congestion or rhinorrhea.     Mouth/Throat:     Mouth: Mucous membranes are moist.  Cardiovascular:     Rate and Rhythm: Normal rate.     Heart sounds: No murmur heard.    No friction  rub. No gallop.  Pulmonary:     Breath sounds: No wheezing, rhonchi or rales.  Musculoskeletal:     Cervical back: Normal range of motion and neck supple. No tenderness.     Wt Readings from Last 3 Encounters:  05/29/23 287 lb (130.2 kg)  04/28/23 280 lb (127 kg)  03/10/23 291 lb 2 oz (132.1 kg)    BP 130/82   Pulse 76   Ht 5' 6.5" (1.689 m)   Wt 287 lb (130.2 kg)   SpO2 95%   BMI 45.63 kg/m   Assessment and Plan:  1. Acute maxillary sinusitis, recurrence not specified New onset.  Persistent.  Relatively stable exam and history is consistent with the development of a sinusitis over the last 48 hours.  Will treat with Augmentin 875 mg twice a day. - amoxicillin-clavulanate (AUGMENTIN) 875-125 MG tablet; Take 1 tablet by mouth 2 (two) times daily.  Dispense: 20 tablet; Refill: 0  2. Mild intermittent reactive airway disease with acute exacerbation Chronic.  Episodic.  Intermittent but mild but patient does have some increase in expiratory to inspiratory ratio.  Patient will refill her albuterol inhaler 2 puffs every 6 hours and may use nebulization in the evenings.  We will of course refill her Singulair 10 mg once a day as well. - albuterol (PROVENTIL) (2.5 MG/3ML) 0.083% nebulizer solution; Take 3 mLs (2.5 mg total) by nebulization every 6 (six) hours as needed for wheezing or shortness of breath.  Dispense: 150 mL; Refill: 1 - albuterol (VENTOLIN  HFA) 108 (90 Base) MCG/ACT inhaler; Inhale 2 puffs into the lungs every 6 (six) hours as needed for wheezing or shortness of breath.  Dispense: 9 g; Refill: 11 - montelukast (SINGULAIR) 10 MG tablet; Take 1 tablet (10 mg total) by mouth at bedtime.  Dispense: 90 tablet; Refill: M3  3. Essential hypertension Patient needs medications refilled would like to give her 90 days of her medication with her blood pressure being in good range at 130/82 but patient needs labs and formal evaluation of her hypertension.  I will write for 90 days extension of her amlodipine 5 mg once a day hydrochlorothiazide 12.5 mg once a day and patient will return in that timeframe on a day that she is off from work that would be convenient to be reevaluated. - amLODipine (NORVASC) 5 MG tablet; Take 1 tablet (5 mg total) by mouth daily.  Dispense: 90 tablet; Refill: 0 - hydrochlorothiazide (HYDRODIURIL) 12.5 MG tablet; Take 1 tablet (12.5 mg total) by mouth daily.  Dispense: 90 tablet; Refill: 0  4. Mixed hyperlipidemia As above patient will have her medication refilled for 90 days in which we would need to have patient return in a fasting state which she is currently not in in order to check her LDL level of control. - atorvastatin (LIPITOR) 20 MG tablet; Take 1 tablet (20 mg total) by mouth daily.  Dispense: 90 tablet; Refill: 0    Elizabeth Sauer, MD

## 2023-06-24 ENCOUNTER — Telehealth: Payer: 59 | Admitting: Nurse Practitioner

## 2023-06-24 DIAGNOSIS — J069 Acute upper respiratory infection, unspecified: Secondary | ICD-10-CM | POA: Diagnosis not present

## 2023-06-24 MED ORDER — PROMETHAZINE-DM 6.25-15 MG/5ML PO SYRP
5.0000 mL | ORAL_SOLUTION | Freq: Four times a day (QID) | ORAL | 0 refills | Status: DC | PRN
Start: 2023-06-24 — End: 2023-08-31

## 2023-06-24 NOTE — Progress Notes (Signed)
 I have spent 5 minutes in review of e-visit questionnaire, review and updating patient chart, medical decision making and response to patient.   Claiborne Rigg, NP

## 2023-06-24 NOTE — Progress Notes (Signed)
 I do recommend you take a COVID test if you haven't already  I have also placed a work note in your mychart if needed for Monday.   For cough I have prescribed for you a prescription cough syrup.  Providers prescribe antibiotics to treat infections caused by bacteria. Antibiotics are very powerful in treating bacterial infections when they are used properly. To maintain their effectiveness, they should be used only when necessary. Overuse of antibiotics has resulted in the development of superbugs that are resistant to treatment!    After careful review of your answers, I would not recommend an antibiotic for your condition.  Antibiotics are not effective against viruses and therefore should not be used to treat them. Common examples of infections caused by viruses include colds and flu   E-Visit for Upper Respiratory Infection   We are sorry you are not feeling well.  Here is how we plan to help!  Based on what you have shared with me, it looks like you may have a viral upper respiratory infection.  Upper respiratory infections are caused by a large number of viruses; however, rhinovirus is the most common cause.   Symptoms vary from person to person, with common symptoms including sore throat, cough, fatigue or lack of energy and feeling of general discomfort.  A low-grade fever of up to 100.4 may present, but is often uncommon.  Symptoms vary however, and are closely related to a person's age or underlying illnesses.  The most common symptoms associated with an upper respiratory infection are nasal discharge or congestion, cough, sneezing, headache and pressure in the ears and face.  These symptoms usually persist for about 3 to 10 days, but can last up to 2 weeks.  It is important to know that upper respiratory infections do not cause serious illness or complications in most cases.    Upper respiratory infections can be transmitted from person to person, with the most common method of  transmission being a person's hands.  The virus is able to live on the skin and can infect other persons for up to 2 hours after direct contact.  Also, these can be transmitted when someone coughs or sneezes; thus, it is important to cover the mouth to reduce this risk.  To keep the spread of the illness at bay, good hand hygiene is very important.  This is an infection that is most likely caused by a virus. There are no specific treatments other than to help you with the symptoms until the infection runs its course.  We are sorry you are not feeling well.  Here is how we plan to help!   For nasal congestion, you may use an oral decongestants such as Mucinex  D or if you have glaucoma or high blood pressure use plain Mucinex .  Saline nasal spray or nasal drops can help and can safely be used as often as needed for congestion.   If you do not have a history of heart disease, hypertension, diabetes or thyroid disease, prostate/bladder issues or glaucoma, you may also use Sudafed to treat nasal congestion.  It is highly recommended that you consult with a pharmacist or your primary care physician to ensure this medication is safe for you to take.     If you have a cough, you may use cough suppressants such as Delsym and Robitussin.  If you have glaucoma or high blood pressure, you can also use Coricidin HBP.     If you have a sore or scratchy  throat, use a saltwater gargle-  to  teaspoon of salt dissolved in a 4-ounce to 8-ounce glass of warm water.  Gargle the solution for approximately 15-30 seconds and then spit.  It is important not to swallow the solution.  You can also use throat lozenges/cough drops and Chloraseptic spray to help with throat pain or discomfort.  Warm or cold liquids can also be helpful in relieving throat pain.  For headache, pain or general discomfort, you can use Ibuprofen or Tylenol  as directed.   Some authorities believe that zinc sprays or the use of Echinacea may shorten the  course of your symptoms.   HOME CARE Only take medications as instructed by your medical team. Be sure to drink plenty of fluids. Water is fine as well as fruit juices, sodas and electrolyte beverages. You may want to stay away from caffeine or alcohol. If you are nauseated, try taking small sips of liquids. How do you know if you are getting enough fluid? Your urine should be a pale yellow or almost colorless. Get rest. Taking a steamy shower or using a humidifier may help nasal congestion and ease sore throat pain. You can place a towel over your head and breathe in the steam from hot water coming from a faucet. Using a saline nasal spray works much the same way. Cough drops, hard candies and sore throat lozenges may ease your cough. Avoid close contacts especially the very young and the elderly Cover your mouth if you cough or sneeze Always remember to wash your hands.   GET HELP RIGHT AWAY IF: You develop worsening fever. If your symptoms do not improve within 10 days You develop yellow or green discharge from your nose over 3 days. You have coughing fits You develop a severe head ache or visual changes. You develop shortness of breath, difficulty breathing or start having chest pain Your symptoms persist after you have completed your treatment plan  MAKE SURE YOU  Understand these instructions. Will watch your condition. Will get help right away if you are not doing well or get worse.  Thank you for choosing an e-visit.  Your e-visit answers were reviewed by a board certified advanced clinical practitioner to complete your personal care plan. Depending upon the condition, your plan could have included both over the counter or prescription medications.  Please review your pharmacy choice. Make sure the pharmacy is open so you can pick up prescription now. If there is a problem, you may contact your provider through [employer] and have the prescription routed to another  pharmacy.  Your safety is important to us . If you have drug allergies check your prescription carefully.   For the next 24 hours you can use MyChart to ask questions about today's visit, request a non-urgent call back, or ask for a work or school excuse. You will get an email in the next two days asking about your experience. I hope that your e-visit has been valuable and will speed your recovery.

## 2023-08-22 ENCOUNTER — Other Ambulatory Visit: Payer: Self-pay | Admitting: Family Medicine

## 2023-08-22 DIAGNOSIS — I10 Essential (primary) hypertension: Secondary | ICD-10-CM

## 2023-08-26 ENCOUNTER — Other Ambulatory Visit: Payer: Self-pay | Admitting: Family Medicine

## 2023-08-26 DIAGNOSIS — E782 Mixed hyperlipidemia: Secondary | ICD-10-CM

## 2023-08-30 ENCOUNTER — Telehealth: Admitting: Nurse Practitioner

## 2023-08-30 DIAGNOSIS — J069 Acute upper respiratory infection, unspecified: Secondary | ICD-10-CM

## 2023-08-30 DIAGNOSIS — R6889 Other general symptoms and signs: Secondary | ICD-10-CM

## 2023-08-30 MED ORDER — OSELTAMIVIR PHOSPHATE 75 MG PO CAPS: 75.0000 mg | ORAL_CAPSULE | Freq: Two times a day (BID) | ORAL | 0 refills | Status: AC

## 2023-08-30 NOTE — Progress Notes (Signed)

## 2023-08-31 MED ORDER — PROMETHAZINE-DM 6.25-15 MG/5ML PO SYRP: 5.0000 mL | ORAL_SOLUTION | Freq: Four times a day (QID) | ORAL | 0 refills | Status: DC | PRN

## 2023-08-31 NOTE — Addendum Note (Signed)
 Addended by: Waldon Merl on: 08/31/2023 06:41 AM   Modules accepted: Orders

## 2023-10-02 ENCOUNTER — Telehealth: Admitting: Nurse Practitioner

## 2023-10-02 DIAGNOSIS — R6889 Other general symptoms and signs: Secondary | ICD-10-CM | POA: Diagnosis not present

## 2023-10-03 MED ORDER — BENZONATATE 100 MG PO CAPS: 100.0000 mg | ORAL_CAPSULE | Freq: Three times a day (TID) | ORAL | 0 refills | Status: DC | PRN

## 2023-10-03 MED ORDER — OSELTAMIVIR PHOSPHATE 75 MG PO CAPS: 75.0000 mg | ORAL_CAPSULE | Freq: Two times a day (BID) | ORAL | 0 refills | Status: AC

## 2023-10-03 NOTE — Progress Notes (Signed)

## 2023-10-03 NOTE — Progress Notes (Signed)
 I have spent 5 minutes in review of e-visit questionnaire, review and updating patient chart, medical decision making and response to patient.   Piedad Climes, PA-C

## 2023-10-03 NOTE — Addendum Note (Signed)
 Addended by: Farris Hong on: 10/03/2023 07:44 AM   Modules accepted: Orders

## 2023-10-06 ENCOUNTER — Encounter

## 2023-10-07 ENCOUNTER — Other Ambulatory Visit: Payer: Self-pay | Admitting: Nurse Practitioner

## 2023-10-07 MED ORDER — PREDNISONE 20 MG PO TABS: 20.0000 mg | ORAL_TABLET | Freq: Every day | ORAL | 0 refills | Status: AC

## 2023-11-07 ENCOUNTER — Telehealth: Payer: Self-pay

## 2023-11-07 ENCOUNTER — Encounter (INDEPENDENT_AMBULATORY_CARE_PROVIDER_SITE_OTHER): Payer: Self-pay

## 2023-11-07 NOTE — Telephone Encounter (Signed)
 Pt needs an appointment was last seen 12/24.  KP  Copied from CRM [id number]. Topic: Clinical - Request for Lab/Test Order >> Nov 07, 2023  1:07 PM Theresa Best wrote: Reason for CRM: Pt wanted to get her blood work done from her last visit for her blood pressure. Advised no appointment needed, walk-in allowed. Please follow up if order is needed. [phone] Pt looking to come in Friday

## 2023-11-10 ENCOUNTER — Ambulatory Visit: Admitting: Family Medicine

## 2023-11-14 ENCOUNTER — Encounter: Payer: Self-pay | Admitting: Family Medicine

## 2023-11-14 ENCOUNTER — Ambulatory Visit: Admitting: Family Medicine

## 2023-11-14 ENCOUNTER — Telehealth: Payer: Self-pay | Admitting: Family Medicine

## 2023-11-14 VITALS — BP 138/80 | HR 92 | Ht 66.5 in | Wt 291.4 lb

## 2023-11-14 DIAGNOSIS — J4521 Mild intermittent asthma with (acute) exacerbation: Secondary | ICD-10-CM | POA: Diagnosis not present

## 2023-11-14 DIAGNOSIS — I10 Essential (primary) hypertension: Secondary | ICD-10-CM

## 2023-11-14 DIAGNOSIS — K219 Gastro-esophageal reflux disease without esophagitis: Secondary | ICD-10-CM

## 2023-11-14 DIAGNOSIS — E782 Mixed hyperlipidemia: Secondary | ICD-10-CM | POA: Diagnosis not present

## 2023-11-14 MED ORDER — MONTELUKAST SODIUM 10 MG PO TABS: 10.0000 mg | ORAL_TABLET | Freq: Every day | ORAL | Status: AC

## 2023-11-14 MED ORDER — ALBUTEROL SULFATE (2.5 MG/3ML) 0.083% IN NEBU
2.5000 mg | INHALATION_SOLUTION | Freq: Four times a day (QID) | RESPIRATORY_TRACT | 1 refills | Status: AC | PRN
Start: 2023-11-14 — End: ?

## 2023-11-14 MED ORDER — ALBUTEROL SULFATE HFA 108 (90 BASE) MCG/ACT IN AERS
2.0000 | INHALATION_SPRAY | Freq: Four times a day (QID) | RESPIRATORY_TRACT | 11 refills | Status: AC | PRN
Start: 2023-11-14 — End: ?

## 2023-11-14 MED ORDER — AMLODIPINE BESYLATE 5 MG PO TABS
5.0000 mg | ORAL_TABLET | Freq: Every day | ORAL | 0 refills | Status: DC
Start: 2023-11-14 — End: 2024-04-24

## 2023-11-14 MED ORDER — PANTOPRAZOLE SODIUM 40 MG PO TBEC: 40.0000 mg | DELAYED_RELEASE_TABLET | Freq: Every day | ORAL | 3 refills | Status: DC

## 2023-11-14 MED ORDER — ATORVASTATIN CALCIUM 20 MG PO TABS: 20.0000 mg | ORAL_TABLET | Freq: Every day | ORAL | 0 refills | Status: DC

## 2023-11-14 MED ORDER — ATORVASTATIN CALCIUM 20 MG PO TABS: 20.0000 mg | ORAL_TABLET | Freq: Every day | ORAL | 5 refills | Status: AC

## 2023-11-14 MED ORDER — HYDROCHLOROTHIAZIDE 25 MG PO TABS: 25.0000 mg | ORAL_TABLET | Freq: Every day | ORAL | 1 refills | Status: AC

## 2023-11-14 NOTE — Telephone Encounter (Signed)
 Patient would like her pharmacy to be [facility] [address] in [place].

## 2023-11-14 NOTE — Telephone Encounter (Signed)
 Noted  Pharmacy changed.  KP

## 2023-11-14 NOTE — Progress Notes (Signed)
 Date:  11/14/2023   Name:  Theresa Best   DOB:  12-15-1960   MRN:  [id number]   Chief Complaint: Hypertension  Hypertension This is a chronic problem. The current episode started more than 1 year ago. The problem has been gradually improving since onset. The problem is controlled. Pertinent negatives include no anxiety, blurred vision, chest pain, headaches, malaise/fatigue, neck pain, orthopnea, palpitations, peripheral edema, PND, shortness of breath or sweats. There are no associated agents to hypertension. Risk factors for coronary artery disease include dyslipidemia. Past treatments include calcium  channel blockers. The current treatment provides moderate improvement. There are no compliance problems.  There is no history of CAD/MI or CVA. There is no history of chronic renal disease, a hypertension causing med or renovascular disease.    Lab Results  Component Value Date   NA 134 (L) 02/14/2023   K 4.1 02/14/2023   CO2 23 02/14/2023   GLUCOSE 147 (H) 02/14/2023   BUN 22 02/14/2023   CREATININE 0.95 02/14/2023   CALCIUM  9.6 02/14/2023   EGFR 99 08/16/2022   GFRNONAA >60 02/14/2023   Lab Results  Component Value Date   CHOL 216 (H) 08/16/2022   HDL 49 08/16/2022   LDLCALC 146 (H) 08/16/2022   TRIG 116 08/16/2022   CHOLHDL 5.8 (H) 01/29/2021   No results found for: "TSH" No results found for: "HGBA1C" Lab Results  Component Value Date   WBC 12.8 (H) 02/14/2023   HGB 14.8 02/14/2023   HCT 45.6 02/14/2023   MCV 89.2 02/14/2023   PLT 298 02/14/2023   Lab Results  Component Value Date   ALT 16 08/16/2022   AST 16 08/16/2022   ALKPHOS 134 (H) 08/16/2022   BILITOT 0.6 08/16/2022   No results found for: "25OHVITD2", "25OHVITD3", "VD25OH"   Review of Systems  Constitutional:  Negative for malaise/fatigue.  HENT:  Negative for congestion.   Eyes:  Negative for blurred vision and visual disturbance.  Respiratory:  Negative for cough, chest tightness, shortness of  breath, wheezing and stridor.   Cardiovascular:  Negative for chest pain, palpitations, orthopnea, leg swelling and PND.  Gastrointestinal:  Negative for abdominal distention.  Endocrine: Negative for polydipsia and polyuria.  Musculoskeletal:  Negative for neck pain.  Neurological:  Negative for headaches.    Patient Active Problem List   Diagnosis Date Noted   Baker's cyst of knee, left 07/04/2022   Lymphedema 06/30/2022   Chronic venous insufficiency 03/19/2022   Varicose veins of right lower extremity with pain 07/22/2019   Familial hypercholesterolemia 07/22/2019   Uterine prolapse 08/16/2018   SUI (stress urinary incontinence, female) 08/16/2018   POP-Q stage 2 cystocele 08/16/2018   Essential hypertension 11/03/2016   Class 3 severe obesity due to excess calories with serious comorbidity and body mass index (BMI) of 40.0 to 44.9 in adult 11/03/2016    No Known Allergies  History reviewed. No pertinent surgical history.  Social History   Tobacco Use   Smoking status: Never   Smokeless tobacco: Never  Substance Use Topics   Alcohol use: No    Alcohol/week: 0.0 standard drinks of alcohol   Drug use: No     Medication list has been reviewed and updated.  Current Meds  Medication Sig   Azelaic Acid 15 % gel Apply topically every morning.   hydrochlorothiazide  (HYDRODIURIL ) 12.5 MG tablet Take 1 tablet by mouth once daily   hydrocortisone 2.5 % ointment Apply topically 2 (two) times daily.   ondansetron  (ZOFRAN -ODT) 4  MG disintegrating tablet Take 1 tablet (4 mg total) by mouth every 8 (eight) hours as needed for nausea or vomiting.   tretinoin (RETIN-A) 0.05 % cream Apply topically at bedtime.   [DISCONTINUED] albuterol  (PROVENTIL ) (2.5 MG/3ML) 0.083% nebulizer solution Take 3 mLs (2.5 mg total) by nebulization every 6 (six) hours as needed for wheezing or shortness of breath.   [DISCONTINUED] albuterol  (VENTOLIN  HFA) 108 (90 Base) MCG/ACT inhaler Inhale 2 puffs into  the lungs every 6 (six) hours as needed for wheezing or shortness of breath.   [DISCONTINUED] amLODipine  (NORVASC ) 5 MG tablet Take 1 tablet by mouth once daily   [DISCONTINUED] atorvastatin  (LIPITOR) 20 MG tablet Take 1 tablet by mouth once daily   [DISCONTINUED] montelukast  (SINGULAIR ) 10 MG tablet Take 1 tablet (10 mg total) by mouth at bedtime.       05/29/2023    4:40 PM 08/16/2022    3:00 PM 04/05/2022    4:28 PM 02/23/2022   11:07 AM  GAD 7 : Generalized Anxiety Score  Nervous, Anxious, on Edge 0 0 0 0  Control/stop worrying 0 0 0 1  Worry too much - different things 0 0 0 1  Trouble relaxing 0 0 0 0  Restless 0 0 0 0  Easily annoyed or irritable 0 0 0 0  Afraid - awful might happen 0 0 0 0  Total GAD 7 Score 0 0 0 2  Anxiety Difficulty Not difficult at all Not difficult at all Not difficult at all Not difficult at all       05/29/2023    4:40 PM 08/16/2022    3:00 PM 04/05/2022    4:28 PM  Depression screen PHQ 2/9  Decreased Interest 0 0 0  Down, Depressed, Hopeless 0 0 0  PHQ - 2 Score 0 0 0  Altered sleeping 0 0 0  Tired, decreased energy 0 0 0  Change in appetite 0 0 0  Feeling bad or failure about yourself  0 0 0  Trouble concentrating 0 0 0  Moving slowly or fidgety/restless 0 0 0  Suicidal thoughts 0 0 0  PHQ-9 Score 0 0 0  Difficult doing work/chores Not difficult at all Not difficult at all Not difficult at all    BP Readings from Last 3 Encounters:  11/14/23 138/80  05/29/23 130/82  04/28/23 (!) 166/93    Physical Exam Vitals and nursing note reviewed.  Constitutional:      General: She is not in acute distress.    Appearance: She is not diaphoretic.  HENT:     Head: Normocephalic and atraumatic.     Right Ear: External ear normal.     Left Ear: External ear normal.     Nose: Nose normal.     Mouth/Throat:     Mouth: Mucous membranes are moist.  Eyes:     General:        Right eye: No discharge.        Left eye: No discharge.      Conjunctiva/sclera: Conjunctivae normal.     Pupils: Pupils are equal, round, and reactive to light.  Neck:     Thyroid: No thyromegaly.     Vascular: No JVD.  Cardiovascular:     Rate and Rhythm: Normal rate and regular rhythm.     Heart sounds: Normal heart sounds. No murmur heard.    No friction rub. No gallop.  Pulmonary:     Effort: Pulmonary effort is normal.  Breath sounds: Normal breath sounds. No wheezing, rhonchi or rales.  Abdominal:     General: Bowel sounds are normal.     Palpations: Abdomen is soft. There is no mass.     Tenderness: There is no abdominal tenderness. There is no guarding.  Musculoskeletal:        General: Normal range of motion.     Cervical back: Normal range of motion and neck supple.  Lymphadenopathy:     Cervical: No cervical adenopathy.  Skin:    General: Skin is warm and dry.  Neurological:     Mental Status: She is alert.     Deep Tendon Reflexes: Reflexes are normal and symmetric.     Wt Readings from Last 3 Encounters:  11/14/23 291 lb 6 oz (132.2 kg)  05/29/23 287 lb (130.2 kg)  04/28/23 280 lb (127 kg)    BP 138/80   Pulse 92   Ht 5' 6.5" (1.689 m)   Wt 291 lb 6 oz (132.2 kg)   SpO2 95%   BMI 46.32 kg/m   Assessment and Plan: 1. Essential hypertension (Primary) Chronic.  Controlled.  Stable.  Asymptomatic.  Tolerating medications well.  Blood pressure 138/80.  Continue amlodipine  5 mg once a day and hydrochlorothiazide  25 mg once a day.  Will check CMP for electrolytes and GFR.  Will recheck patient in 6 months. - amLODipine  (NORVASC ) 5 MG tablet; Take 1 tablet (5 mg total) by mouth daily.  Dispense: 90 tablet; Refill: 0 - hydrochlorothiazide  (HYDRODIURIL ) 25 MG tablet; Take 1 tablet (25 mg total) by mouth daily.  Dispense: 90 tablet; Refill: 1 - Comprehensive metabolic panel with GFR  2. Mild intermittent reactive airway disease with acute exacerbation Chronic.  Controlled.  Stable.  Patient would like both her handheld  and nebulizer albuterol  Colgan to be used on an as-needed basis pending severity of symptomatology.  We will also refill her Singulair  10 mg 1 nightly. - albuterol  (PROVENTIL ) (2.5 MG/3ML) 0.083% nebulizer solution; Take 3 mLs (2.5 mg total) by nebulization every 6 (six) hours as needed for wheezing or shortness of breath.  Dispense: 150 mL; Refill: 1 - albuterol  (VENTOLIN  HFA) 108 (90 Base) MCG/ACT inhaler; Inhale 2 puffs into the lungs every 6 (six) hours as needed for wheezing or shortness of breath.  Dispense: 9 g; Refill: 11 - montelukast  (SINGULAIR ) 10 MG tablet; Take 1 tablet (10 mg total) by mouth at bedtime.  Dispense: 90 tablet; Refill: M3  3. Mixed hyperlipidemia Chronic.  Controlled.  Stable.  Asymptomatic without myalgias or muscle test.  Continue atorvastatin  20 mg daily.  Will recheck patient in 6 months. - atorvastatin  (LIPITOR) 20 MG tablet; Take 1 tablet (20 mg total) by mouth daily.  Dispense: 30 tablet; Refill: 0 - Lipid Panel With LDL/HDL Ratio  4. Gastroesophageal reflux disease without esophagitis 8.  Controlled.  Stable.  Asymptomatic.  Continue pantoprazole 40 mg once a day. - pantoprazole (PROTONIX) 40 MG tablet; Take 1 tablet (40 mg total) by mouth daily.  Dispense: 30 tablet; Refill: 3     Alayne Allis, MD

## 2023-11-15 ENCOUNTER — Ambulatory Visit: Payer: Self-pay | Admitting: Family Medicine

## 2023-11-15 LAB — LIPID PANEL WITH LDL/HDL RATIO
Cholesterol, Total: 207 mg/dL — ABNORMAL HIGH (ref 100–199)
HDL: 45 mg/dL (ref >39)
LDL Chol Calc (NIH): 142 mg/dL — ABNORMAL HIGH (ref 0–99)
LDL/HDL Ratio: 3.2 ratio (ref 0.0–3.2)
Triglycerides: 110 mg/dL (ref 0–149)
VLDL Cholesterol Cal: 20 mg/dL (ref 5–40)

## 2023-11-15 LAB — COMPREHENSIVE METABOLIC PANEL WITH GFR
ALT: 17 IU/L (ref 0–32)
AST: 19 IU/L (ref 0–40)
Albumin: 4.4 g/dL (ref 3.9–4.9)
Alkaline Phosphatase: 137 IU/L — ABNORMAL HIGH (ref 44–121)
BUN/Creatinine Ratio: 21 (ref 12–28)
BUN: 13 mg/dL (ref 8–27)
Bilirubin Total: 0.4 mg/dL (ref 0.0–1.2)
CO2: 22 mmol/L (ref 20–29)
Calcium: 9.5 mg/dL (ref 8.7–10.3)
Chloride: 101 mmol/L (ref 96–106)
Creatinine, Ser: 0.62 mg/dL (ref 0.57–1.00)
Globulin, Total: 2.7 g/dL (ref 1.5–4.5)
Glucose: 92 mg/dL (ref 70–99)
Potassium: 4.4 mmol/L (ref 3.5–5.2)
Sodium: 138 mmol/L (ref 134–144)
Total Protein: 7.1 g/dL (ref 6.0–8.5)
eGFR: 101 mL/min/{1.73_m2} (ref >59)

## 2023-11-19 ENCOUNTER — Other Ambulatory Visit: Payer: Self-pay | Admitting: Family Medicine

## 2023-11-19 DIAGNOSIS — K219 Gastro-esophageal reflux disease without esophagitis: Secondary | ICD-10-CM

## 2023-11-20 ENCOUNTER — Other Ambulatory Visit: Payer: Self-pay | Admitting: Family Medicine

## 2023-11-20 ENCOUNTER — Telehealth: Admitting: Physician Assistant

## 2023-11-20 DIAGNOSIS — I10 Essential (primary) hypertension: Secondary | ICD-10-CM

## 2023-11-20 DIAGNOSIS — J069 Acute upper respiratory infection, unspecified: Secondary | ICD-10-CM

## 2023-11-20 MED ORDER — BENZONATATE 100 MG PO CAPS: 100.0000 mg | ORAL_CAPSULE | Freq: Three times a day (TID) | ORAL | 0 refills | Status: AC | PRN

## 2023-11-20 NOTE — Progress Notes (Signed)
 E-Visit for Upper Respiratory Infection   We are sorry you are not feeling well.  Here is how we plan to help!  Based on what you have shared with me, it looks like you may have a viral upper respiratory infection.  Upper respiratory infections are caused by a large number of viruses; however, rhinovirus is the most common cause.   Recommend taking a home COVID/Flu test which can be purchased at the pharmacy.  If either of these are positive please let us  know.    I am happy to provide a work note.  You are ok to be around others once you are fever free for 24 hours.   Symptoms vary from person to person, with common symptoms including sore throat, cough, fatigue or lack of energy and feeling of general discomfort.  A low-grade fever of up to 100.4 may present, but is often uncommon.  Symptoms vary however, and are closely related to a person's age or underlying illnesses.  The most common symptoms associated with an upper respiratory infection are nasal discharge or congestion, cough, sneezing, headache and pressure in the ears and face.  These symptoms usually persist for about 3 to 10 days, but can last up to 2 weeks.  It is important to know that upper respiratory infections do not cause serious illness or complications in most cases.    Upper respiratory infections can be transmitted from person to person, with the most common method of transmission being a person's hands.  The virus is able to live on the skin and can infect other persons for up to 2 hours after direct contact.  Also, these can be transmitted when someone coughs or sneezes; thus, it is important to cover the mouth to reduce this risk.  To keep the spread of the illness at bay, good hand hygiene is very important.  This is an infection that is most likely caused by a virus. There are no specific treatments other than to help you with the symptoms until the infection runs its course.  We are sorry you are not feeling well.  Here is  how we plan to help!   For nasal congestion, you may use an oral decongestants such as Mucinex  D or if you have glaucoma or high blood pressure use plain Mucinex .  Saline nasal spray or nasal drops can help and can safely be used as often as needed for congestion.  For your congestion, I recommend Fluticasone nasal spray one spray in each nostril twice a day. This can be purchased over the counter.   If you do not have a history of heart disease, hypertension, diabetes or thyroid disease, prostate/bladder issues or glaucoma, you may also use Sudafed to treat nasal congestion.  It is highly recommended that you consult with a pharmacist or your primary care physician to ensure this medication is safe for you to take.     If you have a cough, you may use cough suppressants such as Delsym and Robitussin.  If you have glaucoma or high blood pressure, you can also use Coricidin HBP.   For cough I have prescribed for you A prescription cough medication called Tessalon  Perles 100 mg. You may take 1-2 capsules every 8 hours as needed for cough  If you have a sore or scratchy throat, use a saltwater gargle-  to  teaspoon of salt dissolved in a 4-ounce to 8-ounce glass of warm water.  Gargle the solution for approximately 15-30 seconds and then spit.  It is important not to swallow the solution.  You can also use throat lozenges/cough drops and Chloraseptic spray to help with throat pain or discomfort.  Warm or cold liquids can also be helpful in relieving throat pain.  For headache, pain or general discomfort, you can use Ibuprofen or Tylenol  as directed.   Some authorities believe that zinc sprays or the use of Echinacea may shorten the course of your symptoms.   HOME CARE Only take medications as instructed by your medical team. Be sure to drink plenty of fluids. Water is fine as well as fruit juices, sodas and electrolyte beverages. You may want to stay away from caffeine or alcohol. If you are  nauseated, try taking small sips of liquids. How do you know if you are getting enough fluid? Your urine should be a pale yellow or almost colorless. Get rest. Taking a steamy shower or using a humidifier may help nasal congestion and ease sore throat pain. You can place a towel over your head and breathe in the steam from hot water coming from a faucet. Using a saline nasal spray works much the same way. Cough drops, hard candies and sore throat lozenges may ease your cough. Avoid close contacts especially the very young and the elderly Cover your mouth if you cough or sneeze Always remember to wash your hands.   GET HELP RIGHT AWAY IF: You develop worsening fever. If your symptoms do not improve within 10 days You develop yellow or green discharge from your nose over 3 days. You have coughing fits You develop a severe head ache or visual changes. You develop shortness of breath, difficulty breathing or start having chest pain Your symptoms persist after you have completed your treatment plan  MAKE SURE YOU  Understand these instructions. Will watch your condition. Will get help right away if you are not doing well or get worse.  Thank you for choosing an e-visit.  Your e-visit answers were reviewed by a board certified advanced clinical practitioner to complete your personal care plan. Depending upon the condition, your plan could have included both over the counter or prescription medications.  Please review your pharmacy choice. Make sure the pharmacy is open so you can pick up prescription now. If there is a problem, you may contact your provider through [employer] and have the prescription routed to another pharmacy.  Your safety is important to us . If you have drug allergies check your prescription carefully.   For the next 24 hours you can use MyChart to ask questions about today's visit, request a non-urgent call back, or ask for a work or school excuse. You will get an  email in the next two days asking about your experience. I hope that your e-visit has been valuable and will speed your recovery.  I have spent 5 minutes in review of e-visit questionnaire, review and updating patient chart, medical decision making and response to patient.   Char Common Ward, PA-C

## 2024-04-23 ENCOUNTER — Ambulatory Visit
Admission: RE | Admit: 2024-04-23 | Discharge: 2024-04-23 | Disposition: A | Discharge status: Home / Self Care | Attending: Physician Assistant | Admitting: Physician Assistant

## 2024-04-23 ENCOUNTER — Ambulatory Visit
Admission: RE | Admit: 2024-04-23 | Discharge: 2024-04-23 | Disposition: A | Discharge status: Home / Self Care | Source: Ambulatory Visit | Attending: Physician Assistant | Admitting: Physician Assistant

## 2024-04-23 DIAGNOSIS — N200 Calculus of kidney: Secondary | ICD-10-CM | POA: Diagnosis present

## 2024-04-24 ENCOUNTER — Encounter: Payer: Self-pay | Admitting: Physician Assistant

## 2024-04-24 ENCOUNTER — Ambulatory Visit: Admitting: Physician Assistant

## 2024-04-24 VITALS — BP 166/91 | HR 95 | Ht 67.0 in | Wt 275.0 lb

## 2024-04-24 DIAGNOSIS — Z6841 Body Mass Index (BMI) 40.0 and over, adult: Secondary | ICD-10-CM

## 2024-04-24 DIAGNOSIS — Z87442 Personal history of urinary calculi: Secondary | ICD-10-CM | POA: Diagnosis not present

## 2024-04-24 DIAGNOSIS — E66813 Obesity, class 3: Secondary | ICD-10-CM | POA: Diagnosis not present

## 2024-04-24 NOTE — Progress Notes (Signed)
 04/24/2024 2:18 PM   Theresa Best 06/26/1960 [id number]  CC: Chief Complaint  Patient presents with   Nephrolithiasis   HPI: Theresa Best is a 63 y.o. female with PMH nephrolithiasis and OSA on CPAP who presents today for annual follow-up.   Today she reports she spontaneously passed a stone from the right side last month.  She is asymptomatic today.  She describes occasional nocturnal enuresis, so she wears a pad overnight.  She has nocturia x 2, daytime frequency x 4-5.  No daytime urgency or urge incontinence.  She is moving forward with plans for right knee replacement with Dr. Mardee.  She has been working to lose weight in advance of surgery.  KUB today with no radiopaque urolithiasis.  PMH: Past Medical History:  Diagnosis Date   Hypertension     Surgical History: History reviewed. No pertinent surgical history.  Home Medications:  Allergies as of 04/24/2024   No Known Allergies      Medication List        Accurate as of April 24, 2024  2:18 PM. If you have any questions, ask your nurse or doctor.          STOP taking these medications    amoxicillin -clavulanate 875-125 MG tablet Commonly known as: AUGMENTIN  Stopped by: Mayte Diers   promethazine -dextromethorphan 6.25-15 MG/5ML syrup Commonly known as: PROMETHAZINE -DM Stopped by: Dejion Grillo   tamsulosin  0.4 MG Caps capsule Commonly known as: FLOMAX  Stopped by: Lucie Hones       TAKE these medications    albuterol  (2.5 MG/3ML) 0.083% nebulizer solution Commonly known as: PROVENTIL  Take 3 mLs (2.5 mg total) by nebulization every 6 (six) hours as needed for wheezing or shortness of breath.   albuterol  108 (90 Base) MCG/ACT inhaler Commonly known as: VENTOLIN  HFA Inhale 2 puffs into the lungs every 6 (six) hours as needed for wheezing or shortness of breath.   amLODipine  10 MG tablet Commonly known as: NORVASC  Take 10 mg by mouth daily. What  changed: Another medication with the same name was removed. Continue taking this medication, and follow the directions you see here. Changed by: Javohn Basey   atorvastatin  20 MG tablet Commonly known as: LIPITOR Take 1 tablet (20 mg total) by mouth daily.   Azelaic Acid 15 % gel Apply topically every morning.   Azelastine-Fluticasone 137-50 MCG/ACT Susp Place 1 spray into both nostrils 2 (two) times daily.   azithromycin  250 MG tablet Commonly known as: ZITHROMAX  Take by mouth.   benzonatate  100 MG capsule Commonly known as: TESSALON  Take 1 capsule (100 mg total) by mouth 3 (three) times daily as needed.   budesonide -formoterol  80-4.5 MCG/ACT inhaler Commonly known as: SYMBICORT  Inhale 2 puffs into the lungs.   colchicine 0.6 MG tablet Take 0.6 mg by mouth 2 (two) times daily.   hydrochlorothiazide  25 MG tablet Commonly known as: HYDRODIURIL  Take 1 tablet (25 mg total) by mouth daily.   hydrocortisone 2.5 % ointment Apply topically 2 (two) times daily.   ketoconazole 2 % cream Commonly known as: NIZORAL Apply topically daily.   montelukast  10 MG tablet Commonly known as: SINGULAIR  Take 1 tablet (10 mg total) by mouth at bedtime.   nystatin powder Commonly known as: MYCOSTATIN/NYSTOP APPLY POWDER TOPICALLY TO AFFECTED AREA 2-3 TIMES PER DAY   ondansetron  4 MG disintegrating tablet Commonly known as: ZOFRAN -ODT Take 1 tablet (4 mg total) by mouth every 8 (eight) hours as needed for nausea or vomiting.   pantoprazole  40 MG  tablet Commonly known as: PROTONIX  TAKE 1 TABLET BY MOUTH EVERY DAY   predniSONE  20 MG tablet Commonly known as: DELTASONE  Take 40 mg by mouth daily.   terbinafine 250 MG tablet Commonly known as: LAMISIL Take 1 tablet by mouth daily.   tretinoin 0.05 % cream Commonly known as: RETIN-A Apply topically at bedtime.   triamcinolone cream 0.1 % Commonly known as: KENALOG Apply 0.1 % topically.        Allergies:  No  Known Allergies  Family History: Family History  Problem Relation Age of Onset   Breast cancer Neg Hx     Social History:   reports that she has never smoked. She has never used smokeless tobacco. She reports that she does not drink alcohol and does not use drugs.  Physical Exam: BP (!) 166/91   Pulse 95   Ht 5' 7 (1.702 m)   Wt 275 lb (124.7 kg)   SpO2 94%   BMI 43.07 kg/m   Constitutional:  Alert and oriented, no acute distress, nontoxic appearing HEENT: [place], AT Cardiovascular: No clubbing, cyanosis, or edema Respiratory: Normal respiratory effort, no increased work of breathing Skin: No rashes, bruises or suspicious lesions Neurologic: Grossly intact, no focal deficits, moving all 4 extremities Psychiatric: Normal mood and affect  Pertinent Imaging: KUB, 04/23/2024: See epic  I personally reviewed the images referenced above and note no radiopaque urolithiasis.  Assessment & Plan:   1. History of nephrolithiasis (Primary) He spontaneously passed a right sided stone last month but is currently asymptomatic.  No stone seen on KUB.  Will continue to monitor. - Abdomen 1 view (KUB); Future  2. Class 3 severe obesity due to excess calories with serious comorbidity and body mass index (BMI) of 40.0 to 44.9 in adult [facility]) I offered her referral to weight management to help with weight loss in advance of knee replacement. - Amb Ref to Medical Weight Management   Return in about 1 year (around 04/24/2025) for Annual stone visit with KUB prior.  Lucie Hones, PA-C  [facility] [place] [address], [address] [place], [place] [address] [phone]

## 2024-04-25 ENCOUNTER — Ambulatory Visit: Payer: Self-pay | Admitting: Physician Assistant

## 2024-05-06 ENCOUNTER — Other Ambulatory Visit: Payer: Self-pay | Admitting: Gerontology

## 2024-05-06 ENCOUNTER — Encounter (INDEPENDENT_AMBULATORY_CARE_PROVIDER_SITE_OTHER): Payer: Self-pay

## 2024-05-06 ENCOUNTER — Encounter: Payer: Self-pay | Admitting: Gerontology

## 2024-05-06 DIAGNOSIS — Z1231 Encounter for screening mammogram for malignant neoplasm of breast: Secondary | ICD-10-CM

## 2024-05-21 NOTE — H&P (Signed)
 ORTHOPAEDIC HISTORY & PHYSICAL Theresa Best, Theresa Best - 05/20/2024 8:30 AM EST Formatting of this note is different from the original. Images from the original note were not included. Chief Complaint: Chief Complaint Patient presents with Right Knee - Pain  Reason for Visit: The patient is a 63 y.o. female who presents today for history and physical for an upcoming right total knee arthroplasty to be done by Dr. Mardee on June 03, 2024. The patient saw Dr. Mardee back on April 23, 2024 for reevaluation of her right knee. She has a long history of progressive right knee pain. She localizes most of the pain along the medial aspect of the knee. She reports some swelling, no locking, and some giving way of the knee. The pain is aggravated by any weight bearing. The knee pain limits the patient's ability to ambulate long distances. The patient has not appreciated any significant improvement despite Tylenol , icing, bracing, reduction, and activity modification. She is not using any ambulatory aids. The patient states that the knee pain has progressed to the point that it is significantly interfering with her activities of daily living. The patient is not a diabetic.  She has been working on a weight loss program since she was last evaluated involving healthy diet and aquatics exercise. She has made any progress with her weight optimization.  Medications: Current Outpatient Medications Medication Sig Dispense Refill albuterol  (PROVENTIL ) 2.5 mg /3 mL (0.083 %) nebulizer solution Take 3 mLs (2.5 mg total) by nebulization every 6 (six) hours as needed for Shortness of Breath or Wheezing 75 mL 3 albuterol  MDI, PROVENTIL , VENTOLIN , PROAIR , HFA 90 mcg/actuation inhaler Inhale 2 inhalations into the lungs every 6 (six) hours as needed for Wheezing 1 each 3 amLODIPine  (NORVASC ) 10 MG tablet Take 1 tablet (10 mg total) by mouth once daily 30 tablet 1 atorvastatin  (LIPITOR) 20 MG tablet Take 1 tablet (20  mg total) by mouth once daily 30 tablet 3 azelaic acid (FINACEA) 15 % topical gel Apply 15 % topically once daily azelastine-fluticasone 137-50 mcg/spray Spry Place 1 spray into one nostril 2 (two) times daily 23 g 11 budesonide -formoteroL  (SYMBICORT ) 80-4.5 mcg/actuation inhaler Inhale 2 inhalations into the lungs 2 (two) times daily 10.2 g 3 Compound Medication Apply 1 Application topically 4 (four) times daily as needed 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 Apply 3-4 times a day for severely dry, cracked skin. Reduce use as skin heals 1 each 11 hydroCHLOROthiazide  (HYDRODIURIL ) 25 MG tablet Take 1 tablet (25 mg total) by mouth once daily 30 tablet 3 hydrocortisone 2.5 % ointment Apply 2.5 % topically 2 (two) times daily ipratropium (ATROVENT) 0.06 % nasal spray Place 2 sprays into both nostrils 4 (four) times daily as needed for Rhinitis 15 mL 11 ketoconazole (NIZORAL) 2 % cream Apply 2 % topically once daily losartan (COZAAR) 25 MG tablet Take 1 tablet (25 mg total) by mouth once daily 30 tablet 11 montelukast  (SINGULAIR ) 10 mg tablet Take 10 mg by mouth at bedtime oxyBUTYnin (DITROPAN-XL) 5 MG XL tablet Take 1 tablet (5 mg total) by mouth once daily 90 tablet 3 pantoprazole  (PROTONIX ) 40 MG DR tablet Take 1 tablet (40 mg total) by mouth once daily 30 tablet 3 tamsulosin  (FLOMAX ) 0.4 mg capsule Take 1 capsule (0.4 mg total) by mouth once daily 30 capsule 3 terBINafine HCL (LAMISIL) 250 mg tablet Take 1 tablet by mouth once daily tretinoin (RETIN-A) 0.05 % cream Apply 0.05 % topically at bedtime triamcinolone 0.1 % cream Apply 0.1 %  topically 2 (two) times daily predniSONE  (DELTASONE ) 20 MG tablet Take 2 tablets (40 mg total) by mouth once daily (Patient not taking: Reported on 05/20/2024) 10 tablet 0 promethazine -dextromethorphan (PROMETHAZINE -DM) 6.25-15 mg/5 mL syrup Take 5 mLs by mouth every 4 (four) hours as needed for Cough (Patient not taking: Reported on 05/20/2024) 118 mL  0 tirzepatide (ZEPBOUND) 2.5 mg/0.5 mL pen injector Inject 0.5 mLs (2.5 mg total) subcutaneously every 7 (seven) days (Patient not taking: Reported on 05/20/2024) 2 mL 0  No current facility-administered medications for this visit.  Allergies: No Known Allergies  Past Medical History: Past Medical History: Diagnosis Date Chronic venous insufficiency 03/19/2022 Essential hypertension 11/03/2016 Familial hypercholesterolemia 07/22/2019 Lymphedema 06/30/2022 Nephrolithiasis 11/13/2023 POP-Q stage 2 cystocele 08/16/2018 Uterine prolapse 08/16/2018 Varicose veins of right lower extremity with pain 07/22/2019  Past Surgical History: Past Surgical History: Procedure Laterality Date REMOVAL OVARIAN CYST  Social History: Social History  Socioeconomic History Marital status: Married Spouse name: Deward Number of children: 3 Years of education: 14 Occupational History Occupation: geologist, engineering, 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 Has 3 daughters born 73, born 42, born 70. 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 Has city water. + Guns in the home-locked. No pets. Non-smoking household.  Social Drivers of Catering Manager Strain: Low Risk (05/06/2024) Overall Financial Resource Strain (CARDIA) Difficulty of Paying Living Expenses: Not hard at all Food Insecurity: No Food Insecurity (05/06/2024) Hunger Vital Sign Worried About Running Out of Food in the Last Year: Never true Ran Out of Food in the Last Year: Never true Transportation Needs: No Transportation Needs (05/06/2024) PRAPARE - Contractor (Medical): No Lack of Transportation (Non-Medical): No Physical  Activity: Sufficiently Active (05/06/2024) Exercise Vital Sign Days of Exercise per Week: 7 days Minutes of Exercise per Session: 120 min Stress: No Stress Concern Present (05/06/2024) [employer] of Occupational Health - Occupational Stress Questionnaire Feeling of Stress : Not at all Social Connections: Socially Integrated (05/06/2024) Social Connection and Isolation Panel Frequency of Communication with Friends and Family: More than three times a week Frequency of Social Gatherings with Friends and Family: Twice a week Attends Religious Services: More than 4 times per year Active Member of [employer] or Organizations: Yes Attends Engineer, Structural: More than 4 times per year Marital Status: Married Housing Stability: Low Risk (05/06/2024) Housing Stability Vital Sign Unable to Pay for Housing in the Last Year: No Number of Times Moved in the Last Year: 0 Homeless in the Last Year: No  Family History: Family History Problem Relation Name Age of Onset High blood pressure (Hypertension) Mother Heart failure Father Kidney disease Father High blood pressure (Hypertension) Sister High blood pressure (Hypertension) Brother High blood pressure (Hypertension) Brother  Review of Systems: A comprehensive 14 point ROS was performed, reviewed, and the pertinent orthopaedic findings are documented in the HPI.  Exam BP 138/88  Ht 165.1 cm (5' 5)  Wt (!) 117 kg (258 lb)  LMP (LMP Unknown)  BMI 42.93 kg/m  General: Well-developed, well-nourished female seen in no acute distress. Antalgic gait. Varus thrust to the right knee.  HEENT: Atraumatic, normocephalic. Pupils are equal and reactive to light. Extraocular motion is intact. Sclera are clear. Oropharynx is clear with moist mucosa.  Neck: Supple, nontender,  and with good ROM. No thyromegaly, adenopathy, JVD, or carotid bruits.  Lungs: Clear to auscultation bilaterally.  Cardiovascular: Regular rate and  rhythm. Normal S1, S2. No murmur . No appreciable gallops or rubs. Peripheral pulses are palpable. No lower extremity edema. Homan`s test is negative.  Abdomen: Soft, nontender, nondistended. Bowel sounds are present.  Extremities: Good strength, stability, and range of motion of the upper extremities. Good range of motion of the hips and ankles.  Right Knee: Soft tissue swelling: mild Effusion: none Erythema: none Crepitance: mild Tenderness: medial Alignment: relative varus Mediolateral laxity: medial pseudolaxity Posterior sag: negative Patellar tracking: Good tracking without evidence of subluxation or tilt Atrophy: No significant atrophy. Quadriceps tone was fair to good. Range of motion: 0/0/105 degrees  Neurologic: Awake, alert, and oriented. Sensory function is intact to pinprick and light touch. Motor strength is judged to be 5/5. Motor coordination is within normal limits. No apparent clonus. No tremor.  Heart: Examination of the heart reveals regular, rate, and rhythm. There is no murmur noted on ascultation. There is a normal apical pulse.  Lungs: Lungs are clear to auscultation. There is no wheeze, rhonchi, or crackles. There is normal expansion of bilateral chest walls.  X-rays: I ordered and interpreted standing AP, lateral, and sunrise radiographs of the right knee that were obtained in the office today. There is significant narrowing of the medial cartilage space with bone-on-bone articulation and associated varus alignment. Osteophyte formation is noted. Subchondral sclerosis is noted. No evidence of fracture or dislocation.  Impression: Degenerative arthrosis of the right knee Morbid obesity  Plan: The findings were discussed in detail with the patient. Conservative treatment options were reviewed with the patient. We again discussed the risks and benefits of surgical intervention. The usual perioperative course was also discussed in detail. The patient  expressed understanding of the risks and benefits of surgical intervention and would like to proceed with plans for right total knee arthroplasty.  Body mass index is 42.93 kg/m. Obesity has been associated with hypertension, diabetes, dyslipidemia, obstructive sleep apnea, arthritis, back pain, and depression. Obesity is most commonly caused by sedentary lifestyle and increased calorie intake. The patient should increase physical activity (especially low impact exercise) and reduce dietary intake (e.g. small portion via small plate size, avoid starving yourself, avoid highly processed food). Body mass index greater than 40 has been associated with increased risk for perioperative complications including poor wound healing, surgical site infection, deep venous thrombosis, and cardiopulmonary complications. The patient needs to achieve a target weight of 248 pounds to reach a BMI of 40. We discussed delaying any consideration for surgical intervention until the patient reaches 270 pounds. (The patient weighed 268 pounds on her home scales unclothed. Her goal would be to reach 265 pounds unclothed on her home scales.) Wt Readings from Last 3 Encounters: 05/20/24 (!) 117 kg (258 lb) 05/06/24 (!) 125.2 kg (276 lb) 04/23/24 (!) 124 kg (273 lb 6.4 oz)  I spent a total of 40 minutes in both face-to-face and non-face-to-face activities, excluding procedures performed, for this visit on the date of this encounter.  MEDICAL CLEARANCE: Per anesthesiology. ACTIVITY: As tolerated. WORK STATUS: As tolerated THERAPY: I recommended low impact exercise such as aquatics. MEDICATIONS: Requested Prescriptions  No prescriptions requested or ordered in this encounter  FOLLOW-UP: Return for Postoperative care.  This note will serve as the history and physical for surgery scheduled on June 03, 2024 for a right total knee arthroplasty to be done by Dr. Mardee.  JONATHAN  T MUNDY PA-C, M.S.  This note was  generated in part with voice recognition software and I apologize for any typographical errors that were not detected and corrected. Electronically signed by Theresa Dorn Boas, PA at 05/20/2024 8:29 AM EST

## 2024-05-24 ENCOUNTER — Other Ambulatory Visit: Payer: Self-pay

## 2024-05-24 ENCOUNTER — Inpatient Hospital Stay
Admission: RE | Admit: 2024-05-24 | Discharge: 2024-05-24 | Attending: Orthopedic Surgery | Admitting: Orthopedic Surgery

## 2024-05-24 VITALS — BP 127/82 | HR 94 | Resp 16 | Ht 67.0 in | Wt 268.7 lb

## 2024-05-24 DIAGNOSIS — M1711 Unilateral primary osteoarthritis, right knee: Secondary | ICD-10-CM

## 2024-05-24 DIAGNOSIS — G8929 Other chronic pain: Secondary | ICD-10-CM

## 2024-05-24 DIAGNOSIS — Z01812 Encounter for preprocedural laboratory examination: Secondary | ICD-10-CM

## 2024-05-24 HISTORY — DX: Other complications of anesthesia, initial encounter: T88.59XA

## 2024-05-24 HISTORY — DX: Pain in right knee: M25.561

## 2024-05-24 HISTORY — DX: Sleep apnea, unspecified: G47.30

## 2024-05-24 HISTORY — DX: Unspecified asthma, uncomplicated: J45.909

## 2024-05-24 HISTORY — DX: Unspecified osteoarthritis, unspecified site: M19.90

## 2024-05-24 HISTORY — DX: Personal history of urinary calculi: Z87.442

## 2024-05-24 LAB — URINALYSIS, ROUTINE W REFLEX MICROSCOPIC
Bilirubin Urine: NEGATIVE
Glucose, UA: NEGATIVE mg/dL
Hgb urine dipstick: NEGATIVE
Ketones, ur: NEGATIVE mg/dL
Nitrite: POSITIVE — AB
Protein, ur: NEGATIVE mg/dL
Specific Gravity, Urine: 1.016 (ref 1.005–1.030)
pH: 5 (ref 5.0–8.0)

## 2024-05-24 LAB — SEDIMENTATION RATE: Sed Rate: 23 mm/h (ref 0–30)

## 2024-05-24 LAB — SURGICAL PCR SCREEN
MRSA, PCR: POSITIVE — AB
Staphylococcus aureus: POSITIVE — AB

## 2024-05-24 LAB — C-REACTIVE PROTEIN: CRP: 0.7 mg/dL (ref <1.0)

## 2024-05-24 NOTE — Patient Instructions (Addendum)
 Your procedure is scheduled on: 06/03/24 - Monday Report to the Registration Desk on the 1st floor of the [facility]. To find out your arrival time, please call [phone] between 1PM - 3PM on: 05/31/24 - Friday If your arrival time is 6:00 am, do not arrive before that time as the [facility] entrance doors do not open until 6:00 am.  REMEMBER: Instructions that are not followed completely may result in serious medical risk, up to and including death; or upon the discretion of your surgeon and anesthesiologist your surgery may need to be rescheduled.  Do not eat food after midnight the night before surgery.  No gum chewing or hard candies.  You may however, drink CLEAR liquids up to 2 hours before you are scheduled to arrive for your surgery. Do not drink anything within 2 hours of your scheduled arrival time.  Clear liquids include: - water  - apple juice without pulp - gatorade (not RED colors) - black coffee or tea (Do NOT add milk or creamers to the coffee or tea) Do NOT drink anything that is not on this list.  In addition, your doctor has ordered for you to drink the provided:  Ensure Pre-Surgery Clear Carbohydrate Drink  Drinking this carbohydrate drink up to two hours before surgery helps to reduce insulin resistance and improve patient outcomes. Please complete drinking 2 hours before scheduled arrival time.  One week prior to surgery: Stop Anti-inflammatories (NSAIDS) such as Advil, Aleve, Ibuprofen, Motrin, Naproxen, Naprosyn and Aspirin based products such as Excedrin, Goody's Powder, BC Powder. You may take Tylenol  if needed for pain up until the day of surgery.  Stop beginning 12/08, ANY OVER THE COUNTER supplements until after surgery.  HOLD hydrochlorothiazide  (HYDRODIURIL ) and Losartan on the day of surgery.  ON THE DAY OF SURGERY ONLY TAKE THESE MEDICATIONS WITH SIPS OF WATER:  albuterol  (PROVENTIL ) neb amLODipine  (NORVASC )  SYMBICORT    Use inhaler  albuterol  (VENTOLIN  HFA)  on the day of surgery and bring to the hospital.  No Alcohol for 24 hours before or after surgery.  No Smoking including e-cigarettes for 24 hours before surgery.  No chewable tobacco products for at least 6 hours before surgery.  No nicotine patches on the day of surgery.  Do not use any recreational drugs for at least a week (preferably 2 weeks) before your surgery.  Please be advised that the combination of cocaine and anesthesia may have negative outcomes, up to and including death. If you test positive for cocaine, your surgery will be cancelled.  On the morning of surgery brush your teeth with toothpaste and water, you may rinse your mouth with mouthwash if you wish. Do not swallow any toothpaste or mouthwash.  Use CHG Soap or wipes as directed on instruction sheet.  Do not wear jewelry, make-up, hairpins, clips or nail polish.  For welded (permanent) jewelry: bracelets, anklets, waist bands, etc.  Please have this removed prior to surgery.  If it is not removed, there is a chance that hospital personnel will need to cut it off on the day of surgery.  Do not wear lotions, powders, or perfumes.   Do not shave body hair from the neck down 48 hours before surgery.  Contact lenses, hearing aids and dentures may not be worn into surgery.  Do not bring valuables to the hospital. [facility] is not responsible for any missing/lost belongings or valuables.   Notify your doctor if there is any change in your medical condition (cold,  fever, infection).  Wear comfortable clothing (specific to your surgery type) to the hospital.  After surgery, you can help prevent lung complications by doing breathing exercises.  Take deep breaths and cough every 1-2 hours. Your doctor may order a device called an Incentive Spirometer to help you take deep breaths.  If you are being admitted to the hospital overnight, leave your suitcase in the car. After surgery it may be  brought to your room.  In case of increased patient census, it may be necessary for you, the patient, to continue your postoperative care in the Same Day Surgery department.  If you are being discharged the day of surgery, you will not be allowed to drive home. You will need a responsible individual to drive you home and stay with you for 24 hours after surgery.   If you are taking public transportation, you will need to have a responsible individual with you.  Please call the Pre-admissions Testing Dept. at [phone] if you have any questions about these instructions.  Surgery Visitation Policy:  Patients having surgery or a procedure may have two visitors.  Children under the age of 69 must have an adult with them who is not the patient.  Inpatient Visitation:    Visiting hours are 7 a.m. to 8 p.m. Up to four visitors are allowed at one time in a patient room. The visitors may rotate out with other people during the day.  One visitor age 37 or older may stay with the patient overnight and must be in the room by 8 p.m.   Merchandiser, Retail to address health-related social needs:  [email or web address]    Pre-operative 4 CHG Bath Instructions   You can play a key role in reducing the risk of infection after surgery. Your skin needs to be as free of germs as possible. You can reduce the number of germs on your skin by washing with CHG (chlorhexidine gluconate) soap before surgery. CHG is an antiseptic soap that kills germs and continues to kill germs even after washing.   DO NOT use if you have an allergy to chlorhexidine/CHG or antibacterial soaps. If your skin becomes reddened or irritated, stop using the CHG and notify one of our RNs at [phone].   Please shower with the CHG soap starting 4 days before surgery using the following schedule: 12/11 - 12/14.    Please keep in mind the following:  DO NOT shave, including legs and underarms, starting  the day of your first shower.   You may shave your face at any point before/day of surgery.  Place clean sheets on your bed the day you start using CHG soap. Use a clean washcloth (not used since being washed) for each shower. DO NOT sleep with pets once you start using the CHG.   CHG Shower Instructions:  If you choose to wash your hair and private area, wash first with your normal shampoo/soap.  After you use shampoo/soap, rinse your hair and body thoroughly to remove shampoo/soap residue.  Turn the water OFF and apply about 3 tablespoons (45 ml) of CHG soap to a CLEAN washcloth.  Apply CHG soap ONLY FROM YOUR NECK DOWN TO YOUR TOES (washing for 3-5 minutes)  DO NOT use CHG soap on face, private areas, open wounds, or sores.  Pay special attention to the area where your surgery is being performed.  If you are having back surgery, having someone wash your back for you may be helpful. Wait 2 minutes  after CHG soap is applied, then you may rinse off the CHG soap.  Pat dry with a clean towel  Put on clean clothes/pajamas   If you choose to wear lotion, please use ONLY the CHG-compatible lotions on the back of this paper.     Additional instructions for the day of surgery: DO NOT APPLY any lotions, deodorants, cologne, or perfumes.   Put on clean/comfortable clothes.  Brush your teeth.  Ask your nurse before applying any prescription medications to the skin.      CHG Compatible Lotions   Aveeno Moisturizing lotion  Cetaphil Moisturizing Cream  Cetaphil Moisturizing Lotion  Clairol Herbal Essence Moisturizing Lotion, Dry Skin  Clairol Herbal Essence Moisturizing Lotion, Extra Dry Skin  Clairol Herbal Essence Moisturizing Lotion, Normal Skin  Curel Age Defying Therapeutic Moisturizing Lotion with Alpha Hydroxy  Curel Extreme Care Body Lotion  Curel Soothing Hands Moisturizing Hand Lotion  Curel Therapeutic Moisturizing Cream, Fragrance-Free  Curel Therapeutic Moisturizing Lotion,  Fragrance-Free  Curel Therapeutic Moisturizing Lotion, Original Formula  Eucerin Daily Replenishing Lotion  Eucerin Dry Skin Therapy Plus Alpha Hydroxy Crme  Eucerin Dry Skin Therapy Plus Alpha Hydroxy Lotion  Eucerin Original Crme  Eucerin Original Lotion  Eucerin Plus Crme Eucerin Plus Lotion  Eucerin TriLipid Replenishing Lotion  Keri Anti-Bacterial Hand Lotion  Keri Deep Conditioning Original Lotion Dry Skin Formula Softly Scented  Keri Deep Conditioning Original Lotion, Fragrance Free Sensitive Skin Formula  Keri Lotion Fast Absorbing Fragrance Free Sensitive Skin Formula  Keri Lotion Fast Absorbing Softly Scented Dry Skin Formula  Keri Original Lotion  Keri Skin Renewal Lotion Keri Silky Smooth Lotion  Keri Silky Smooth Sensitive Skin Lotion  Nivea Body Creamy Conditioning Oil  Nivea Body Extra Enriched Lotion  Nivea Body Original Lotion  Nivea Body Sheer Moisturizing Lotion Nivea Crme  Nivea Skin Firming Lotion  NutraDerm 30 Skin Lotion  NutraDerm Skin Lotion  NutraDerm Therapeutic Skin Cream  NutraDerm Therapeutic Skin Lotion  ProShield Protective Hand Cream  Provon moisturizing lotion  How to Use an Incentive Spirometer  An incentive spirometer is a tool that measures how well you are filling your lungs with each breath. Learning to take long, deep breaths using this tool can help you keep your lungs clear and active. This may help to reverse or lessen your chance of developing breathing (pulmonary) problems, especially infection. You may be asked to use a spirometer: After a surgery. If you have a lung problem or a history of smoking. After a long period of time when you have been unable to move or be active. If the spirometer includes an indicator to show the highest number that you have reached, your health care provider or respiratory therapist will help you set a goal. Keep a log of your progress as told by your health care provider. What are the  risks? Breathing too quickly may cause dizziness or cause you to pass out. Take your time so you do not get dizzy or light-headed. If you are in pain, you may need to take pain medicine before doing incentive spirometry. It is harder to take a deep breath if you are having pain. How to use your incentive spirometer  Sit up on the edge of your bed or on a chair. Hold the incentive spirometer so that it is in an upright position. Before you use the spirometer, breathe out normally. Place the mouthpiece in your mouth. Make sure your lips are closed tightly around it. Breathe in slowly and as deeply  as you can through your mouth, causing the piston or the ball to rise toward the top of the chamber. Hold your breath for 3-5 seconds, or for as long as possible. If the spirometer includes a coach indicator, use this to guide you in breathing. Slow down your breathing if the indicator goes above the marked areas. Remove the mouthpiece from your mouth and breathe out normally. The piston or ball will return to the bottom of the chamber. Rest for a few seconds, then repeat the steps 10 or more times. Take your time and take a few normal breaths between deep breaths so that you do not get dizzy or light-headed. Do this every 1-2 hours when you are awake. If the spirometer includes a goal marker to show the highest number you have reached (best effort), use this as a goal to work toward during each repetition. After each set of 10 deep breaths, cough a few times. This will help to make sure that your lungs are clear. If you have an incision on your chest or abdomen from surgery, place a pillow or a rolled-up towel firmly against the incision when you cough. This can help to reduce pain while taking deep breaths and coughing. General tips When you are able to get out of bed: Walk around often. Continue to take deep breaths and cough in order to clear your lungs. Keep using the incentive spirometer until  your health care provider says it is okay to stop using it. If you have been in the hospital, you may be told to keep using the spirometer at home. Contact a health care provider if: You are having difficulty using the spirometer. You have trouble using the spirometer as often as instructed. Your pain medicine is not giving enough relief for you to use the spirometer as told. You have a fever. Get help right away if: You develop shortness of breath. You develop a cough with bloody mucus from the lungs. You have fluid or blood coming from an incision site after you cough. Summary An incentive spirometer is a tool that can help you learn to take long, deep breaths to keep your lungs clear and active. You may be asked to use a spirometer after a surgery, if you have a lung problem or a history of smoking, or if you have been inactive for a long period of time. Use your incentive spirometer as instructed every 1-2 hours while you are awake. If you have an incision on your chest or abdomen, place a pillow or a rolled-up towel firmly against your incision when you cough. This will help to reduce pain. Get help right away if you have shortness of breath, you cough up bloody mucus, or blood comes from your incision when you cough. This information is not intended to replace advice given to you by your health care provider. Make sure you discuss any questions you have with your health care provider. Document Revised: 08/26/2019 Document Reviewed: 08/26/2019 Elsevier Patient Education  2023 Elsevier Inc.   Preoperative Educational Videos for Total Hip, Knee and Shoulder Replacements  To better prepare for surgery, please view our videos that explain the physical activity and discharge planning required to have the best surgical recovery at [facility].  [email or web address]  Questions? Call [phone] or email  [email or web address]         POLAR CARE INFORMATION  [email or web address]  How to use Breg Polar Care [facility] Therapy System?  YouTube   [email or web address].co.[email or web address]  OPERATING INSTRUCTIONS  Start the product With dry hands, connect the transformer to the electrical connection located on the top of the cooler. Next, plug the transformer into an appropriate electrical outlet. The unit will automatically start running at this point.  To stop the pump, disconnect electrical power.  Unplug to stop the product when not in use. Unplugging the Polar Care unit turns it off. Always unplug immediately after use. Never leave it plugged in while unattended. Remove pad.    FIRST ADD WATER TO FILL LINE, THEN ICE---Replace ice when existing ice is almost melted  1 Discuss Treatment with your Licensed Health Care Practitioner and Use Only as Prescribed 2 Apply Insulation Barrier & Cold Therapy Pad 3 Check for Moisture 4 Inspect Skin Regularly  Tips and Trouble Shooting Usage Tips 1. Use cubed or chunked ice for optimal performance. 2. It is recommended to drain the Pad between uses. To drain the pad, hold the Pad upright with the hose pointed toward the ground. Depress the black plunger and allow water to drain out. 3. You may disconnect the Pad from the unit without removing the pad from the affected area by depressing the silver tabs on the hose coupling and gently pulling the hoses apart. The Pad and unit will seal itself and will not leak. Note: Some dripping during release is normal. 4. DO NOT RUN PUMP WITHOUT WATER! The pump in this unit is designed to run with water. Running the unit without water will cause permanent damage to the pump. 5. Unplug unit before removing lid.  TROUBLESHOOTING GUIDE Pump not running, Water not flowing to the pad, Pad is not getting cold 1. Make sure the transformer is plugged into the wall outlet. 2. Confirm that the ice and water are  filled to the indicated levels. 3. Make sure there are no kinks in the pad. 4. Gently pull on the blue tube to make sure the tube/pad junction is straight. 5. Remove the pad from the treatment site and ll it while the pad is lying at; then reapply. 6. Confirm that the pad couplings are securely attached to the unit. Listen for the double clicks (Figure 1) to confirm the pad couplings are securely attached.  Leaks    Note: Some condensation on the lines, controller, and pads is unavoidable, especially in warmer climates. 1. If using a Breg Polar Care Cold Therapy unit with a detachable Cold Therapy Pad, and a leak exists (other than condensation on the lines) disconnect the pad couplings. Make sure the silver tabs on the couplings are depressed before reconnecting the pad to the pump hose; then confirm both sides of the coupling are properly clicked in. 2. If the coupling continues to leak or a leak is detected in the pad itself, stop using it and call Breg Customer Care at [phone].  Cleaning After use, empty and dry the unit with a soft cloth. Warm water and mild detergent may be used occasionally to clean the pump and tubes.  WARNING: The Polar Care Cube can be cold enough to cause serious injury, including full skin necrosis. Follow these Operating Instructions, and carefully read the Product Insert (see pouch on side of unit) and the Cold Therapy Pad Fitting Instructions (provided with each Cold Therapy Pad) prior to use.

## 2024-05-24 NOTE — Discharge Instructions (Signed)

## 2024-05-27 NOTE — Progress Notes (Signed)
  Perioperative Services  Abnormal Lab Notification and Treatment Plan of Care  Date: 05/27/24  Name: Theresa Best DOB: Oct 04, 1960 MRN:   [id number]  Re: Abnormal labs noted during PAT appointment  Provider Notified: Mardee Lynwood SHAUNNA, MD Notification mode: Routed and/or faxed via [facility]  Labs of concern: Lab Results  Component Value Date   STAPHAUREUS POSITIVE (A) 05/24/2024   MRSAPCR POSITIVE (A) 05/24/2024     Notes: Patient is scheduled for a ARTHROPLASTY, KNEE, TOTAL, USING IMAGELESS COMPUTER-ASSISTED NAVIGATION (Right: Knee) on 06/03/2024. She is scheduled to receive CEFAZOLIN pre-operatively. Pre-surgical PCR (+) for MRSA; see above.  PLANS:  Theresa Best is scheduled for the above procedure with Dr. Lynwood Mardee, MD.  Preoperative testing revealed a (+) MRSA PCR screen. Patient will need additional preoperative antimicrobial coverage for this pathogen.  Sending result to primary attending surgeon for review.  Surgeon to place further orders as deemed appropriate for this case.  Patient's medical history updated in EMR to reflect today's (+) result. No further needs from the PAT department identified at this time.  Dorise Pereyra, MSN, APRN, FNP-C, CEN [facility]  Perioperative Services Nurse Practitioner Phone: [phone] 05/27/24 7:36 AM

## 2024-05-28 ENCOUNTER — Ambulatory Visit: Payer: Self-pay | Admitting: Urgent Care

## 2024-05-29 LAB — URINE CULTURE: Culture: >=100000 — AB

## 2024-05-31 ENCOUNTER — Other Ambulatory Visit: Payer: Self-pay | Admitting: Student

## 2024-05-31 DIAGNOSIS — K219 Gastro-esophageal reflux disease without esophagitis: Secondary | ICD-10-CM

## 2024-06-02 ENCOUNTER — Encounter: Payer: Self-pay | Admitting: Orthopedic Surgery

## 2024-06-03 ENCOUNTER — Ambulatory Visit: Payer: Self-pay | Admitting: Urgent Care

## 2024-06-03 ENCOUNTER — Observation Stay

## 2024-06-03 ENCOUNTER — Encounter: Payer: Self-pay | Admitting: Orthopedic Surgery

## 2024-06-03 ENCOUNTER — Observation Stay
Admission: RE | Admit: 2024-06-03 | Discharge: 2024-06-04 | Disposition: A | Attending: Orthopedic Surgery | Admitting: Orthopedic Surgery

## 2024-06-03 ENCOUNTER — Other Ambulatory Visit: Payer: Self-pay

## 2024-06-03 ENCOUNTER — Encounter: Admission: RE | Disposition: A | Payer: Self-pay | Attending: Orthopedic Surgery

## 2024-06-03 DIAGNOSIS — M1711 Unilateral primary osteoarthritis, right knee: Secondary | ICD-10-CM

## 2024-06-03 DIAGNOSIS — J45909 Unspecified asthma, uncomplicated: Secondary | ICD-10-CM | POA: Diagnosis not present

## 2024-06-03 DIAGNOSIS — R8290 Unspecified abnormal findings in urine: Secondary | ICD-10-CM

## 2024-06-03 DIAGNOSIS — Z96651 Presence of right artificial knee joint: Secondary | ICD-10-CM

## 2024-06-03 DIAGNOSIS — R8271 Bacteriuria: Secondary | ICD-10-CM

## 2024-06-03 DIAGNOSIS — Z79899 Other long term (current) drug therapy: Secondary | ICD-10-CM | POA: Diagnosis not present

## 2024-06-03 DIAGNOSIS — Z01812 Encounter for preprocedural laboratory examination: Secondary | ICD-10-CM

## 2024-06-03 DIAGNOSIS — I10 Essential (primary) hypertension: Secondary | ICD-10-CM | POA: Diagnosis not present

## 2024-06-03 HISTORY — PX: KNEE ARTHROPLASTY: SHX992

## 2024-06-03 SURGERY — ARTHROPLASTY, KNEE, TOTAL, USING IMAGELESS COMPUTER-ASSISTED NAVIGATION
Anesthesia: Spinal | Site: Knee | Laterality: Right

## 2024-06-03 MED ORDER — ENSURE PRE-SURGERY PO LIQD
296.0000 mL | Freq: Once | ORAL | Status: AC
  Administered 2024-06-03: 07:00:00 296 mL via ORAL
  Filled 2024-06-03: qty 296

## 2024-06-03 MED ORDER — DIPHENHYDRAMINE HCL 12.5 MG/5ML PO ELIX: 12.5000 mg | ORAL_SOLUTION | ORAL | Status: DC | PRN

## 2024-06-03 MED ORDER — EPHEDRINE SULFATE-NACL 50-0.9 MG/10ML-% IV SOSY
PREFILLED_SYRINGE | INTRAVENOUS | Status: DC | PRN
  Administered 2024-06-03: 08:00:00 5 mg via INTRAVENOUS
  Administered 2024-06-03: 08:00:00 10 mg via INTRAVENOUS

## 2024-06-03 MED ORDER — DEXAMETHASONE SOD PHOSPHATE PF 10 MG/ML IJ SOLN
8.0000 mg | Freq: Once | INTRAMUSCULAR | Status: DC
  Administered 2024-06-03: 06:00:00 8 mg via INTRAVENOUS

## 2024-06-03 MED ORDER — CHLORHEXIDINE GLUCONATE 4 % EX SOLN
60.0000 mL | Freq: Once | CUTANEOUS | Status: AC
  Administered 2024-06-03: 07:00:00 4 via TOPICAL

## 2024-06-03 MED ORDER — GLYCOPYRROLATE 0.2 MG/ML IJ SOLN
INTRAMUSCULAR | Status: AC
  Filled 2024-06-03: qty 1

## 2024-06-03 MED ORDER — OXYBUTYNIN CHLORIDE ER 5 MG PO TB24
5.0000 mg | ORAL_TABLET | Freq: Every day | ORAL | Status: DC
  Administered 2024-06-03: 22:00:00 5 mg via ORAL
  Filled 2024-06-03: qty 1

## 2024-06-03 MED ORDER — PROPOFOL 10 MG/ML IV BOLUS
INTRAVENOUS | Status: AC
  Filled 2024-06-03: qty 20

## 2024-06-03 MED ORDER — CEFAZOLIN SODIUM-DEXTROSE 2-4 GM/100ML-% IV SOLN
INTRAVENOUS | Status: AC
  Filled 2024-06-03: qty 100

## 2024-06-03 MED ORDER — TRANEXAMIC ACID-NACL 1000-0.7 MG/100ML-% IV SOLN
INTRAVENOUS | Status: AC
  Filled 2024-06-03: qty 100

## 2024-06-03 MED ORDER — GABAPENTIN 300 MG PO CAPS
ORAL_CAPSULE | ORAL | Status: AC
  Filled 2024-06-03: qty 1

## 2024-06-03 MED ORDER — BUPIVACAINE HCL (PF) 0.5 % IJ SOLN
INTRAMUSCULAR | Status: AC
  Filled 2024-06-03: qty 10

## 2024-06-03 MED ORDER — CHLORHEXIDINE GLUCONATE 0.12 % MT SOLN
15.0000 mL | Freq: Once | OROMUCOSAL | Status: AC
  Administered 2024-06-03: 07:00:00 15 mL via OROMUCOSAL

## 2024-06-03 MED ORDER — BUPIVACAINE HCL (PF) 0.25 % IJ SOLN
INTRAMUSCULAR | Status: AC
  Filled 2024-06-03: qty 60

## 2024-06-03 MED ORDER — ONDANSETRON HCL 4 MG/2ML IJ SOLN
INTRAMUSCULAR | Status: DC | PRN
  Administered 2024-06-03: 08:00:00 4 mg via INTRAVENOUS

## 2024-06-03 MED ORDER — MIDAZOLAM HCL 5 MG/5ML IJ SOLN
INTRAMUSCULAR | Status: DC | PRN
  Administered 2024-06-03: 07:00:00 2 mg via INTRAVENOUS

## 2024-06-03 MED ORDER — SODIUM CHLORIDE 0.9 % IV SOLN
INTRAVENOUS | Status: DC | PRN
  Administered 2024-06-03: 10:00:00 60 mL

## 2024-06-03 MED ORDER — PANTOPRAZOLE SODIUM 40 MG PO TBEC
40.0000 mg | DELAYED_RELEASE_TABLET | Freq: Two times a day (BID) | ORAL | Status: DC
  Administered 2024-06-03 – 2024-06-04 (×3): 40 mg via ORAL
  Filled 2024-06-03 (×3): qty 1

## 2024-06-03 MED ORDER — VANCOMYCIN HCL IN DEXTROSE 1-5 GM/200ML-% IV SOLN
INTRAVENOUS | Status: AC
  Filled 2024-06-03: qty 200

## 2024-06-03 MED ORDER — CELECOXIB 200 MG PO CAPS
ORAL_CAPSULE | ORAL | Status: AC
  Filled 2024-06-03: qty 2

## 2024-06-03 MED ORDER — FLEET ENEMA RE ENEM: 1.0000 | ENEMA | Freq: Once | RECTAL | Status: DC | PRN

## 2024-06-03 MED ORDER — ASPIRIN 81 MG PO CHEW
81.0000 mg | CHEWABLE_TABLET | Freq: Two times a day (BID) | ORAL | Status: DC
  Administered 2024-06-04: 10:00:00 81 mg via ORAL
  Filled 2024-06-03: qty 1

## 2024-06-03 MED ORDER — ALBUTEROL SULFATE (2.5 MG/3ML) 0.083% IN NEBU: 3.0000 mL | INHALATION_SOLUTION | Freq: Four times a day (QID) | RESPIRATORY_TRACT | Status: DC | PRN

## 2024-06-03 MED ORDER — SURGIPHOR WOUND IRRIGATION SYSTEM - OPTIME
TOPICAL | Status: DC | PRN
  Administered 2024-06-03: 10:00:00 450 mL via TOPICAL

## 2024-06-03 MED ORDER — PHENOL 1.4 % MT LIQD: 1.0000 | OROMUCOSAL | Status: DC | PRN

## 2024-06-03 MED ORDER — TRANEXAMIC ACID-NACL 1000-0.7 MG/100ML-% IV SOLN
1000.0000 mg | INTRAVENOUS | Status: AC
  Administered 2024-06-03: 08:00:00 1000 mg via INTRAVENOUS

## 2024-06-03 MED ORDER — LIDOCAINE HCL (PF) 2 % IJ SOLN
INTRAMUSCULAR | Status: AC
  Filled 2024-06-03: qty 5

## 2024-06-03 MED ORDER — LOSARTAN POTASSIUM 50 MG PO TABS
25.0000 mg | ORAL_TABLET | Freq: Every day | ORAL | Status: DC
  Administered 2024-06-03: 16:00:00 25 mg via ORAL
  Filled 2024-06-03: qty 1

## 2024-06-03 MED ORDER — HYDROCHLOROTHIAZIDE 25 MG PO TABS: 25.0000 mg | ORAL_TABLET | Freq: Every day | ORAL | Status: DC

## 2024-06-03 MED ORDER — CHLORHEXIDINE GLUCONATE 4 % EX SOLN: 1.0000 | CUTANEOUS | 1 refills | Status: DC

## 2024-06-03 MED ORDER — CEFAZOLIN SODIUM-DEXTROSE 2-4 GM/100ML-% IV SOLN
2.0000 g | INTRAVENOUS | Status: AC
  Administered 2024-06-03: 08:00:00 2 g via INTRAVENOUS

## 2024-06-03 MED ORDER — ALUM & MAG HYDROXIDE-SIMETH 200-200-20 MG/5ML PO SUSP: 30.0000 mL | ORAL | Status: DC | PRN

## 2024-06-03 MED ORDER — ATORVASTATIN CALCIUM 20 MG PO TABS
20.0000 mg | ORAL_TABLET | Freq: Every day | ORAL | Status: DC
  Administered 2024-06-03: 22:00:00 20 mg via ORAL
  Filled 2024-06-03: qty 2

## 2024-06-03 MED ORDER — GABAPENTIN 300 MG PO CAPS
300.0000 mg | ORAL_CAPSULE | Freq: Once | ORAL | Status: AC
  Administered 2024-06-03: 07:00:00 300 mg via ORAL

## 2024-06-03 MED ORDER — OXYCODONE HCL 5 MG PO TABS
10.0000 mg | ORAL_TABLET | ORAL | Status: DC | PRN
  Administered 2024-06-03 – 2024-06-04 (×2): 10 mg via ORAL
  Filled 2024-06-03 (×2): qty 2

## 2024-06-03 MED ORDER — BUPIVACAINE HCL (PF) 0.5 % IJ SOLN
INTRAMUSCULAR | Status: DC | PRN
  Administered 2024-06-03: 08:00:00 3 mL via INTRATHECAL

## 2024-06-03 MED ORDER — MIDAZOLAM HCL 2 MG/2ML IJ SOLN
INTRAMUSCULAR | Status: AC
  Filled 2024-06-03: qty 2

## 2024-06-03 MED ORDER — AMLODIPINE BESYLATE 5 MG PO TABS: 10.0000 mg | ORAL_TABLET | Freq: Every day | ORAL | Status: DC

## 2024-06-03 MED ORDER — DEXMEDETOMIDINE HCL IN NACL 80 MCG/20ML IV SOLN
INTRAVENOUS | Status: DC | PRN
  Administered 2024-06-03 (×3): 4 ug via INTRAVENOUS
  Administered 2024-06-03: 11:00:00 8 ug via INTRAVENOUS

## 2024-06-03 MED ORDER — EPHEDRINE 5 MG/ML INJ
INTRAVENOUS | Status: AC
  Filled 2024-06-03: qty 5

## 2024-06-03 MED ORDER — SENNOSIDES-DOCUSATE SODIUM 8.6-50 MG PO TABS
1.0000 | ORAL_TABLET | Freq: Two times a day (BID) | ORAL | Status: DC
  Administered 2024-06-03 – 2024-06-04 (×2): 1 via ORAL
  Filled 2024-06-03 (×2): qty 1

## 2024-06-03 MED ORDER — METOCLOPRAMIDE HCL 10 MG PO TABS
10.0000 mg | ORAL_TABLET | Freq: Three times a day (TID) | ORAL | Status: DC
  Administered 2024-06-03 – 2024-06-04 (×3): 10 mg via ORAL
  Filled 2024-06-03 (×3): qty 1

## 2024-06-03 MED ORDER — CELECOXIB 200 MG PO CAPS
400.0000 mg | ORAL_CAPSULE | Freq: Once | ORAL | Status: AC
  Administered 2024-06-03: 07:00:00 400 mg via ORAL

## 2024-06-03 MED ORDER — FENTANYL CITRATE (PF) 100 MCG/2ML IJ SOLN
INTRAMUSCULAR | Status: DC | PRN
  Administered 2024-06-03 (×4): 50 ug via INTRAVENOUS

## 2024-06-03 MED ORDER — CHLORHEXIDINE GLUCONATE 0.12 % MT SOLN
OROMUCOSAL | Status: AC
  Filled 2024-06-03: qty 15

## 2024-06-03 MED ORDER — CELECOXIB 200 MG PO CAPS
200.0000 mg | ORAL_CAPSULE | Freq: Two times a day (BID) | ORAL | Status: DC
  Administered 2024-06-03 – 2024-06-04 (×2): 200 mg via ORAL
  Filled 2024-06-03 (×2): qty 1

## 2024-06-03 MED ORDER — PROPOFOL 10 MG/ML IV BOLUS
INTRAVENOUS | Status: DC | PRN
  Administered 2024-06-03: 08:00:00 30 mg via INTRAVENOUS
  Administered 2024-06-03: 11:00:00 50 mg via INTRAVENOUS

## 2024-06-03 MED ORDER — BUPIVACAINE LIPOSOME 1.3 % IJ SUSP
INTRAMUSCULAR | Status: AC
  Filled 2024-06-03: qty 20

## 2024-06-03 MED ORDER — FLUTICASONE FUROATE-VILANTEROL 100-25 MCG/ACT IN AEPB
1.0000 | INHALATION_SPRAY | Freq: Every day | RESPIRATORY_TRACT | Status: DC
  Administered 2024-06-04: 09:00:00 1 via RESPIRATORY_TRACT
  Filled 2024-06-03: qty 28

## 2024-06-03 MED ORDER — ONDANSETRON HCL 4 MG PO TABS: 4.0000 mg | ORAL_TABLET | Freq: Four times a day (QID) | ORAL | Status: DC | PRN

## 2024-06-03 MED ORDER — ACETAMINOPHEN 10 MG/ML IV SOLN
INTRAVENOUS | Status: AC
  Filled 2024-06-03: qty 100

## 2024-06-03 MED ORDER — PHENYLEPHRINE 80 MCG/ML (10ML) SYRINGE FOR IV PUSH (FOR BLOOD PRESSURE SUPPORT)
PREFILLED_SYRINGE | INTRAVENOUS | Status: DC | PRN
  Administered 2024-06-03: 08:00:00 120 ug via INTRAVENOUS
  Administered 2024-06-03: 08:00:00 80 ug via INTRAVENOUS

## 2024-06-03 MED ORDER — PROPOFOL 1000 MG/100ML IV EMUL
INTRAVENOUS | Status: AC
  Filled 2024-06-03: qty 100

## 2024-06-03 MED ORDER — LIDOCAINE HCL (CARDIAC) PF 100 MG/5ML IV SOSY
PREFILLED_SYRINGE | INTRAVENOUS | Status: DC | PRN
  Administered 2024-06-03: 08:00:00 60 mg via INTRAVENOUS

## 2024-06-03 MED ORDER — FENTANYL CITRATE (PF) 100 MCG/2ML IJ SOLN
25.0000 ug | INTRAMUSCULAR | Status: DC | PRN
  Administered 2024-06-03 (×2): 50 ug via INTRAVENOUS

## 2024-06-03 MED ORDER — VANCOMYCIN HCL IN DEXTROSE 1-5 GM/200ML-% IV SOLN
1000.0000 mg | Freq: Once | INTRAVENOUS | Status: AC
  Administered 2024-06-03: 07:00:00 1000 mg via INTRAVENOUS

## 2024-06-03 MED ORDER — BISACODYL 10 MG RE SUPP: 10.0000 mg | Freq: Every day | RECTAL | Status: DC | PRN

## 2024-06-03 MED ORDER — ACETAMINOPHEN 325 MG PO TABS: 325.0000 mg | ORAL_TABLET | Freq: Four times a day (QID) | ORAL | Status: DC | PRN

## 2024-06-03 MED ORDER — FENTANYL CITRATE (PF) 100 MCG/2ML IJ SOLN
INTRAMUSCULAR | Status: AC
  Filled 2024-06-03: qty 2

## 2024-06-03 MED ORDER — ONDANSETRON HCL 4 MG/2ML IJ SOLN: 4.0000 mg | Freq: Four times a day (QID) | INTRAMUSCULAR | Status: DC | PRN

## 2024-06-03 MED ORDER — SODIUM CHLORIDE 0.9 % IR SOLN
Status: DC | PRN
  Administered 2024-06-03: 10:00:00 3000 mL

## 2024-06-03 MED ORDER — OXYCODONE HCL 5 MG PO TABS
5.0000 mg | ORAL_TABLET | ORAL | Status: DC | PRN
  Administered 2024-06-03 (×2): 5 mg via ORAL
  Filled 2024-06-03 (×2): qty 1

## 2024-06-03 MED ORDER — MENTHOL 3 MG MT LOZG: 1.0000 | LOZENGE | OROMUCOSAL | Status: DC | PRN

## 2024-06-03 MED ORDER — FERROUS SULFATE 325 (65 FE) MG PO TABS
325.0000 mg | ORAL_TABLET | Freq: Two times a day (BID) | ORAL | Status: DC
  Administered 2024-06-03 – 2024-06-04 (×2): 325 mg via ORAL
  Filled 2024-06-03 (×2): qty 1

## 2024-06-03 MED ORDER — ORAL CARE MOUTH RINSE: 15.0000 mL | OROMUCOSAL | Status: DC | PRN

## 2024-06-03 MED ORDER — ACETAMINOPHEN 10 MG/ML IV SOLN
INTRAVENOUS | Status: DC | PRN
  Administered 2024-06-03: 09:00:00 1000 mg via INTRAVENOUS

## 2024-06-03 MED ORDER — CEFAZOLIN SODIUM-DEXTROSE 2-4 GM/100ML-% IV SOLN
2.0000 g | Freq: Four times a day (QID) | INTRAVENOUS | Status: AC
  Administered 2024-06-03 (×2): 2 g via INTRAVENOUS
  Filled 2024-06-03 (×2): qty 100

## 2024-06-03 MED ORDER — SODIUM CHLORIDE 0.9 % IV SOLN: INTRAVENOUS | Status: DC

## 2024-06-03 MED ORDER — OXYCODONE HCL 5 MG PO TABS: 5.0000 mg | ORAL_TABLET | Freq: Once | ORAL | Status: DC | PRN

## 2024-06-03 MED ORDER — PROPOFOL 500 MG/50ML IV EMUL
INTRAVENOUS | Status: DC | PRN
  Administered 2024-06-03: 08:00:00 100 ug/kg/min via INTRAVENOUS

## 2024-06-03 MED ORDER — HYDROMORPHONE HCL 1 MG/ML IJ SOLN: 0.5000 mg | INTRAMUSCULAR | Status: DC | PRN

## 2024-06-03 MED ORDER — DEXMEDETOMIDINE HCL IN NACL 80 MCG/20ML IV SOLN
INTRAVENOUS | Status: AC
  Filled 2024-06-03: qty 20

## 2024-06-03 MED ORDER — GLYCOPYRROLATE 0.2 MG/ML IJ SOLN
INTRAMUSCULAR | Status: DC | PRN
  Administered 2024-06-03: 08:00:00 .2 mg via INTRAVENOUS

## 2024-06-03 MED ORDER — ONDANSETRON HCL 4 MG/2ML IJ SOLN
INTRAMUSCULAR | Status: AC
  Filled 2024-06-03: qty 2

## 2024-06-03 MED ORDER — MAGNESIUM HYDROXIDE 400 MG/5ML PO SUSP
30.0000 mL | Freq: Every day | ORAL | Status: DC
  Administered 2024-06-04: 10:00:00 30 mL via ORAL
  Filled 2024-06-03: qty 30

## 2024-06-03 MED ORDER — MUPIROCIN 2 % EX OINT: 1.0000 | TOPICAL_OINTMENT | Freq: Two times a day (BID) | CUTANEOUS | 0 refills | Status: DC

## 2024-06-03 MED ORDER — ORAL CARE MOUTH RINSE: 15.0000 mL | Freq: Once | OROMUCOSAL | Status: AC

## 2024-06-03 MED ORDER — ACETAMINOPHEN 10 MG/ML IV SOLN
1000.0000 mg | Freq: Four times a day (QID) | INTRAVENOUS | Status: AC
  Administered 2024-06-03 – 2024-06-04 (×4): 1000 mg via INTRAVENOUS
  Filled 2024-06-03 (×4): qty 100

## 2024-06-03 MED ORDER — BUPIVACAINE HCL (PF) 0.25 % IJ SOLN
INTRAMUSCULAR | Status: DC | PRN
  Administered 2024-06-03: 10:00:00 60 mL

## 2024-06-03 MED ORDER — TRAMADOL HCL 50 MG PO TABS
50.0000 mg | ORAL_TABLET | ORAL | Status: DC | PRN
  Administered 2024-06-03: 18:00:00 100 mg via ORAL
  Filled 2024-06-03: qty 2

## 2024-06-03 MED ORDER — OXYCODONE HCL 5 MG/5ML PO SOLN: 5.0000 mg | Freq: Once | ORAL | Status: DC | PRN

## 2024-06-03 MED ORDER — LACTATED RINGERS IV SOLN: INTRAVENOUS | Status: DC

## 2024-06-03 MED ORDER — TRANEXAMIC ACID-NACL 1000-0.7 MG/100ML-% IV SOLN
1000.0000 mg | Freq: Once | INTRAVENOUS | Status: AC
  Administered 2024-06-03: 12:00:00 1000 mg via INTRAVENOUS

## 2024-06-03 MED ORDER — MONTELUKAST SODIUM 10 MG PO TABS
10.0000 mg | ORAL_TABLET | Freq: Every day | ORAL | Status: DC
  Administered 2024-06-03: 22:00:00 10 mg via ORAL
  Filled 2024-06-03: qty 1

## 2024-06-03 MED ORDER — SODIUM CHLORIDE (PF) 0.9 % IJ SOLN
INTRAMUSCULAR | Status: AC
  Filled 2024-06-03: qty 40

## 2024-06-03 SURGICAL SUPPLY — 62 items
ATTUNE PSFEM RTSZ6 NARCEM KNEE (Femur) IMPLANT
ATTUNE PSRP INSR SZ6 7 KNEE (Insert) IMPLANT
BASE TIBIAL ROT PLAT SZ 5 KNEE (Knees) IMPLANT
BATTERY INSTRU NAVIGATION (MISCELLANEOUS) ×4 IMPLANT
BIT DRILL QUICK REL 1/8 2PK SL (BIT) ×1 IMPLANT
BLADE CLIPPER SURG (BLADE) IMPLANT
BLADE SAW 70X12.5 (BLADE) ×1 IMPLANT
BLADE SAW 90X13X1.19 OSCILLAT (BLADE) ×1 IMPLANT
BLADE SAW 90X25X1.19 OSCILLAT (BLADE) ×1 IMPLANT
BRUSH SCRUB EZ PLAIN DRY (MISCELLANEOUS) ×1 IMPLANT
CEMENT BONE GENTAMICIN 40 (Cement) IMPLANT
COOLER ICEMAN CLASSIC (MISCELLANEOUS) ×1 IMPLANT
CUFF TRNQT CYL 30X4X21-28X (TOURNIQUET CUFF) IMPLANT
DRAPE SHEET LG 3/4 BI-LAMINATE (DRAPES) ×1 IMPLANT
DRSG AQUACEL AG ADV 3.5X14 (GAUZE/BANDAGES/DRESSINGS) ×1 IMPLANT
DRSG MEPILEX SACRM 8.7X9.8 (GAUZE/BANDAGES/DRESSINGS) ×1 IMPLANT
DRSG TEGADERM 4X4.75 (GAUZE/BANDAGES/DRESSINGS) ×1 IMPLANT
DRSG XEROFORM 1X8 (GAUZE/BANDAGES/DRESSINGS) IMPLANT
DURAPREP 26ML APPLICATOR (WOUND CARE) ×2 IMPLANT
ELECT CAUTERY BLADE 6.4 (BLADE) ×1 IMPLANT
ELECTRODE REM PT RTRN 9FT ADLT (ELECTROSURGICAL) ×1 IMPLANT
EVACUATOR 1/8 PVC DRAIN (DRAIN) ×1 IMPLANT
EX-PIN ORTHOLOCK NAV 4X150 (PIN) ×2 IMPLANT
GAUZE XEROFORM 1X8 LF (GAUZE/BANDAGES/DRESSINGS) ×1 IMPLANT
GLOVE BIOGEL M STRL SZ7.5 (GLOVE) ×3 IMPLANT
GLOVE BIOGEL PI IND STRL 7.0 (GLOVE) ×1 IMPLANT
GLOVE SRG 8 PF TXTR STRL LF DI (GLOVE) ×1 IMPLANT
GLOVE SURG SYN 7.0 PF PI (GLOVE) ×3 IMPLANT
GOWN STRL REUS W/ TWL LRG LVL3 (GOWN DISPOSABLE) ×2 IMPLANT
GOWN TOGA ZIPPER T7+ PEEL AWAY (MISCELLANEOUS) ×1 IMPLANT
HOLDER FOLEY CATH W/STRAP (MISCELLANEOUS) ×1 IMPLANT
HOOD PEEL AWAY T7 (MISCELLANEOUS) ×1 IMPLANT
KIT TURNOVER KIT A (KITS) ×1 IMPLANT
KNIFE SCULPS 14X20 (INSTRUMENTS) ×1 IMPLANT
MANIFOLD NEPTUNE II (INSTRUMENTS) ×2 IMPLANT
NDL SPNL 20GX3.5 QUINCKE YW (NEEDLE) ×2 IMPLANT
PACK TOTAL KNEE (MISCELLANEOUS) ×1 IMPLANT
PAD ABD DERMACEA PRESS 5X9 (GAUZE/BANDAGES/DRESSINGS) ×2 IMPLANT
PAD ARMBOARD POSITIONER FOAM (MISCELLANEOUS) ×3 IMPLANT
PAD COLD UNI WRAP-ON (PAD) ×1 IMPLANT
PATELLA MEDIAL ATTUN 35MM KNEE (Knees) IMPLANT
PENCIL SMOKE EVACUATOR COATED (MISCELLANEOUS) ×1 IMPLANT
PIN DRILL FIX HALF THREAD (BIT) ×2 IMPLANT
PIN FIXATION 1/8DIA X 3INL (PIN) ×1 IMPLANT
SOL .9 NS 3000ML IRR UROMATIC (IV SOLUTION) ×1 IMPLANT
SOLN STERILE WATER BTL 1000 ML (IV SOLUTION) ×1 IMPLANT
SOLUTION IRRIG SURGIPHOR (IV SOLUTION) ×1 IMPLANT
SPONGE DRAIN TRACH 4X4 STRL 2S (GAUZE/BANDAGES/DRESSINGS) ×1 IMPLANT
STAPLER SKIN PROX 35W (STAPLE) ×1 IMPLANT
STOCKINETTE IMPERV 14X48 (MISCELLANEOUS) ×1 IMPLANT
STOCKINETTE STRL BIAS CUT 8X4 (MISCELLANEOUS) ×1 IMPLANT
STRAP TIBIA SHORT (MISCELLANEOUS) ×1 IMPLANT
SUCTION TUBE FRAZIER 10FR DISP (SUCTIONS) ×1 IMPLANT
SUT VIC AB 0 CT1 36 (SUTURE) ×1 IMPLANT
SUT VIC AB 1 CT1 36 (SUTURE) ×2 IMPLANT
SUT VIC AB 2-0 CT2 27 (SUTURE) ×1 IMPLANT
SYR 30ML LL (SYRINGE) ×2 IMPLANT
TIP FAN IRRIG PULSAVAC PLUS (DISPOSABLE) ×1 IMPLANT
TOWEL OR 17X26 4PK STRL BLUE (TOWEL DISPOSABLE) IMPLANT
TOWER CARTRIDGE SMART MIX (DISPOSABLE) ×1 IMPLANT
TRAP FLUID SMOKE EVACUATOR (MISCELLANEOUS) ×1 IMPLANT
TRAY FOLEY MTR SLVR 16FR STAT (SET/KITS/TRAYS/PACK) ×1 IMPLANT

## 2024-06-03 NOTE — Telephone Encounter (Signed)
 Requested Prescriptions  Pending Prescriptions Disp Refills   pantoprazole  (PROTONIX ) 40 MG tablet [Pharmacy Med Name: PANTOPRAZOLE  SOD DR 40 MG TAB] 90 tablet 0    Sig: Take 1 tablet (40 mg total) by mouth daily as needed (acid reflux).     Gastroenterology: Proton Pump Inhibitors Passed - 06/03/2024  3:37 PM      Passed - Valid encounter within last 12 months    Recent Outpatient Visits           6 months ago Essential hypertension   [facility] Joshua Cathryne JAYSON, MD       Future Appointments             In 10 months Vaillancourt, Samantha, PA-C [facility]

## 2024-06-03 NOTE — TOC Initial Note (Signed)
 Transition of Care [facility]) - Initial/Assessment Note    Patient Details  Name: Theresa Best MRN: [id number] Date of Birth: [birth year]  Transition of Care [facility] CM/SW Contact:    Nathanael CHRISTELLA Ring, RN Phone Number: 06/03/2024, 1:53 PM  Clinical Narrative:                 Patient has been prearranged with home health PT and OT by Dr. Glenys office with [facility] agency.  Patient needs a RW and 3 in 1.  DME ordered from [employer] and will be delivered to the bedside.   Expected Discharge Plan: Home w Home Health Services Barriers to Discharge: Continued Medical Work up   Patient Goals and CMS Choice            Expected Discharge Plan and Services   Discharge Planning Services: CM Consult   Living arrangements for the past 2 months: Single Family Home                 DME Arranged: Walker rolling, 3-N-1 DME Agency: [employer] Date DME Agency Contacted: 06/03/24 Time DME Agency Contacted: 1353 Representative spoke with at DME Agency: [place] HH Arranged: PT, OT HH Agency: [facility] Date [facility] Agency Contacted: 06/03/24 Time HH Agency Contacted: 1353 Representative spoke with at [facility] Agency: [place]   Prior Living Arrangements/Services Living arrangements for the past 2 months: Single Family Home Lives with:: Spouse Patient language and need for interpreter reviewed:: Yes Do you feel safe going back to the place where you live?: Yes      Need for Family Participation in Patient Care: Yes (Comment) Care giver support system in place?: Yes (comment)   Criminal Activity/Legal Involvement Pertinent to Current Situation/Hospitalization: No - Comment as needed  Activities of Daily Living   ADL Screening (condition at time of admission) Independently performs ADLs?: Yes (appropriate for developmental age) Is the patient deaf or have difficulty hearing?: No Does the patient have difficulty seeing, even when wearing glasses/contacts?: No Does the patient have  difficulty concentrating, remembering, or making decisions?: No  Permission Sought/Granted                  Emotional Assessment         Alcohol / Substance Use: Not Applicable Psych Involvement: No (comment)  Admission diagnosis:  Primary osteoarthritis of right knee [M17.11] History of total knee arthroplasty, right [Z96.651] Patient Active Problem List   Diagnosis Date Noted   History of total knee arthroplasty, right 06/03/2024   Gastroesophageal reflux disease without esophagitis 05/08/2024   Prediabetes 05/08/2024   Dry skin 05/06/2024   Severe obstructive sleep apnea 05/06/2024   Acute gout involving toe of right foot 01/30/2024   Nephrolithiasis 11/13/2023   Arthralgia of right knee 03/22/2023   Primary osteoarthritis of right knee 03/22/2023   Baker's cyst of knee, left 07/04/2022   Lymphedema 06/30/2022   Chronic venous insufficiency 03/19/2022   Varicose veins of right lower extremity with pain 07/22/2019   Familial hypercholesterolemia 07/22/2019   Uterine prolapse 08/16/2018   SUI (stress urinary incontinence, female) 08/16/2018   POP-Q stage 2 cystocele 08/16/2018   Essential hypertension 11/03/2016   Class 3 severe obesity due to excess calories with serious comorbidity and body mass index (BMI) of 40.0 to 44.9 in adult [facility]) 11/03/2016   PCP:  Steva Clotilda DEL, NP Pharmacy:   [facility] [phone] [address], [place] - [address] [place] [address] Phone: [phone] Fax: [phone]  [facility] [address] [place] - [address] [facility] [address] [place] [address] Phone: [phone] Fax: [phone]     Social Drivers of Health (SDOH) Social History: SDOH Screenings   Food Insecurity: Food Insecurity Present (06/03/2024)  Housing: Low Risk (06/03/2024)  Transportation Needs: No Transportation Needs (06/03/2024)  Utilities: Not At Risk (06/03/2024)  Depression (PHQ2-9): Low Risk (05/29/2023)   Financial Resource Strain: Low Risk  (05/06/2024)   Received from [facility]  Physical Activity: Sufficiently Active (05/06/2024)   Received from [facility]  Social Connections: Socially Integrated (05/06/2024)   Received from [facility]  Stress: No Stress Concern Present (05/06/2024)   Received from [facility]  Tobacco Use: Low Risk (06/03/2024)  Health Literacy: Adequate Health Literacy (05/06/2024)   Received from [facility]   SDOH Interventions:     Readmission Risk Interventions     No data to display

## 2024-06-03 NOTE — Anesthesia Preprocedure Evaluation (Addendum)
 Anesthesia Evaluation  Patient identified by MRN, date of birth, ID band Patient awake    Reviewed: Allergy & Precautions, NPO status , Patient's Chart, lab work & pertinent test results  History of Anesthesia Complications (+) PROLONGED EMERGENCE and history of anesthetic complications  Airway Mallampati: III  TM Distance: >3 FB Neck ROM: full    Dental  (+) Chipped   Pulmonary asthma , sleep apnea and Continuous Positive Airway Pressure Ventilation    Pulmonary exam normal        Cardiovascular Exercise Tolerance: Good hypertension, (-) angina (-) Past MI Normal cardiovascular exam     Neuro/Psych negative neurological ROS  negative psych ROS   GI/Hepatic Neg liver ROS,GERD  Controlled,,  Endo/Other    Class 3 obesity  Renal/GU Renal disease     Musculoskeletal   Abdominal   Peds  Hematology negative hematology ROS (+)   Anesthesia Other Findings Past Medical History: No date: Arthritis No date: Asthma No date: Complication of anesthesia     Comment:  slow to wake up during prolaspe surgery, woke up during               colonoscopy No date: History of kidney stones No date: Hypertension No date: Right knee pain No date: Sleep apnea     Comment:  uses cpap  Past Surgical History: No date: COLONOSCOPY No date: uterine prolaspe 2012     Reproductive/Obstetrics negative OB ROS                              Anesthesia Physical Anesthesia Plan  ASA: 3  Anesthesia Plan: Spinal   Post-op Pain Management:    Induction:   PONV Risk Score and Plan:   Airway Management Planned: Natural Airway and Nasal Cannula  Additional Equipment:   Intra-op Plan:   Post-operative Plan:   Informed Consent: I have reviewed the patients History and Physical, chart, labs and discussed the procedure including the risks, benefits and alternatives for the proposed anesthesia with the  patient or authorized representative who has indicated his/her understanding and acceptance.     Dental Advisory Given  Plan Discussed with: Anesthesiologist, CRNA and Surgeon  Anesthesia Plan Comments: (Patient reports no bleeding problems and no anticoagulant use.  Plan for spinal with backup GA  Patient consented for risks of anesthesia including but not limited to:  - adverse reactions to medications - damage to eyes, teeth, lips or other oral mucosa - nerve damage due to positioning  - risk of bleeding, infection and or nerve damage from spinal that could lead to paralysis - risk of headache or failed spinal - damage to teeth, lips or other oral mucosa - sore throat or hoarseness - damage to heart, brain, nerves, lungs, other parts of body or loss of life  Patient voiced understanding and assent.)         Anesthesia Quick Evaluation

## 2024-06-03 NOTE — Progress Notes (Signed)
 Patient has mobility impairment for daily activities. A rolling walker will resolve this and the patient is safe to use it  [employer]. Myrtle Haller, Jr., M.D.

## 2024-06-03 NOTE — Progress Notes (Signed)
 Patient is not able to walk the distance required to go the bathroom, or he/she is unable to safely negotiate stairs required to access the bathroom.  A 3in1 BSC will alleviate this problem   Amenda Duclos P. Angie Fava M.D.

## 2024-06-03 NOTE — Transfer of Care (Signed)
 Immediate Anesthesia Transfer of Care Note  Patient: Theresa Best  Procedure(s) Performed: ARTHROPLASTY, KNEE, TOTAL, USING IMAGELESS COMPUTER-ASSISTED NAVIGATION (Right: Knee)  Patient Location: PACU  Anesthesia Type:Spinal  Level of Consciousness: awake, alert , and oriented  Airway & Oxygen  Therapy: Patient Spontanous Breathing and Patient connected to face mask oxygen   Post-op Assessment: Report given to RN and Post -op Vital signs reviewed and stable  Post vital signs: Reviewed and stable  Last Vitals:  Vitals Value Taken Time  BP 102/65 06/03/24 11:15  Temp    Pulse 79 06/03/24 11:17  Resp 15 06/03/24 11:17  SpO2 95 % 06/03/24 11:17  Vitals shown include unfiled device data.  Last Pain:  Vitals:   06/03/24 0618  TempSrc: Temporal  PainSc: 0-No pain         Complications: No notable events documented.

## 2024-06-03 NOTE — Interval H&P Note (Signed)
 History and Physical Interval Note:  06/03/2024 6:23 AM  Theresa Best  has presented today for surgery, with the diagnosis of Primary osteoarthritis of right knee..  The various methods of treatment have been discussed with the patient and family. After consideration of risks, benefits and other options for treatment, the patient has consented to  Procedures: ARTHROPLASTY, KNEE, TOTAL, USING IMAGELESS COMPUTER-ASSISTED NAVIGATION (Right) as a surgical intervention.  The patient's history has been reviewed, patient examined, no change in status, stable for surgery.  I have reviewed the patient's chart and labs.  Questions were answered to the patient's satisfaction.     Dinita Migliaccio P [place] Cloe

## 2024-06-03 NOTE — Evaluation (Signed)
 Occupational Therapy Evaluation Patient Details Name: Theresa Best MRN: [id number] DOB: December 22, 1960 Today's Date: 06/03/2024   History of Present Illness   Pt is a 63 y/o F admitted on 06/03/24 for scheduled R TKA. PMH: HTN, lymphedema, cystocele, uterine prolapse     Clinical Impressions Pt seen for OT evaluation this date, POD#0 from above surgery. Pt eager to participate despite 5/10 R knee pain. Pt is eager to return to PLOF with less pain and improved safety and independence with ability to play with her grandkids. Pt currently requires minimal assist for LB ADL while in seated position due to pain and limited AROM of R knee. Pt/family instructed in polar care mgt, falls prevention strategies, home/routines modifications, DME/AE for LB bathing and dressing tasks, and compression stocking mgt. Pt ambulated in the hall with RW requiring SBA-CGA 40' + 40'. R knee pain up to 7/10 improving back to 5/10 once returned to sitting. Pt would benefit from skilled OT services while hospitalized including additional instruction in dressing techniques with or without assistive devices for dressing and bathing skills to support recall and carryover prior to discharge and ultimately to maximize safety, independence, and minimize falls risk and caregiver burden. Do not currently anticipate any OT needs following this hospitalization.      If plan is discharge home, recommend the following:   A little help with bathing/dressing/bathroom;Assistance with cooking/housework;Assist for transportation;Help with stairs or ramp for entrance     Functional Status Assessment   Patient has had a recent decline in their functional status and demonstrates the ability to make significant improvements in function in a reasonable and predictable amount of time.     Equipment Recommendations   BSC/3in1;Other (comment) (RW)     Recommendations for Other Services         Precautions/Restrictions    Precautions Precautions: Fall;Knee Precaution Booklet Issued: Yes (comment) Recall of Precautions/Restrictions: Intact Precaution/Restrictions Comments: watch O2 Restrictions Weight Bearing Restrictions Per Provider Order: Yes RLE Weight Bearing Per Provider Order: Weight bearing as tolerated     Mobility Bed Mobility               General bed mobility comments: NT    Transfers Overall transfer level: Needs assistance Equipment used: Rolling walker (2 wheels) Transfers: Sit to/from Stand Sit to Stand: Contact guard assist, Supervision           General transfer comment: SBA, good hand placement without cues to stand; VC for reaching back when sitting to slow descent      Balance Overall balance assessment: Needs assistance Sitting-balance support: Feet supported Sitting balance-Leahy Scale: Good     Standing balance support: During functional activity, Bilateral upper extremity supported, Reliant on assistive device for balance Standing balance-Leahy Scale: Fair                             ADL either performed or assessed with clinical judgement   ADL Overall ADL's : Needs assistance/impaired                                       General ADL Comments: Pt currently requires MIN A for LB ADL tasks, CGA for ADL transfers, and assist for compression stocking mgt for RLE, and polar care mgt. Family able to provide needed level of assist.     Vision  Perception         Praxis         Pertinent Vitals/Pain Pain Assessment Pain Score: 5  Pain Location: R knee at rest, increased to 7 with movement but came back down after returned to sitting Pain Descriptors / Indicators: Grimacing, Discomfort Pain Intervention(s): Limited activity within patient's tolerance, Monitored during session, Premedicated before session, Repositioned, Ice applied     Extremity/Trunk Assessment Upper Extremity Assessment Upper Extremity  Assessment: Overall WFL for tasks assessed   Lower Extremity Assessment Lower Extremity Assessment: RLE deficits/detail;Defer to PT evaluation RLE Deficits / Details: 3-/5 knee extension in sitting   Cervical / Trunk Assessment Cervical / Trunk Assessment: Normal   Communication Communication Communication: No apparent difficulties   Cognition Arousal: Alert Behavior During Therapy: WFL for tasks assessed/performed Cognition: No apparent impairments                               Following commands: Intact       Cueing  General Comments   Cueing Techniques: Verbal cues  lowest SpO2 86% on room air, pt able to increase to >/= 92% with cuing re: pursed lip breathing; SpO2 93% on room air at end of session, nurse made aware.   Exercises Other Exercises Other Exercises: Pt edu in post-op knee OT handout for AE/DME, falls prevention, home/routines modifications. Edu in activity pacing with gradual return to activities , polar care mgt, and compression stocking mgt. Other Exercises: Pt ambulated with RW in the hall requiring SBA-CGA, ~40' + 40'   Shoulder Instructions      Home Living Family/patient expects to be discharged to:: Private residence Living Arrangements: Spouse/significant other Available Help at Discharge: Family Type of Home: House Home Access: Stairs to enter [employer] of Steps: 2 Entrance Stairs-Rails: None Home Layout: One level;Able to live on main level with bedroom/bathroom     Bathroom Shower/Tub: Chief Strategy Officer: Standard     Home Equipment: None          Prior Functioning/Environment Prior Level of Function : Independent/Modified Independent;Driving;Working/employed             Mobility Comments: 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 ADLs Comments: Independent.    OT Problem List: Decreased strength;Pain;Decreased range of motion;Impaired balance  (sitting and/or standing);Decreased activity tolerance;Decreased knowledge of use of DME or AE   OT Treatment/Interventions: Self-care/ADL training;Therapeutic exercise;Therapeutic activities;DME and/or AE instruction;Patient/family education;Balance training;Energy conservation      OT Goals(Current goals can be found in the care plan section)   Acute Rehab OT Goals Patient Stated Goal: get back to playing with my grandkids OT Goal Formulation: With patient/family Time For Goal Achievement: 06/17/24 Potential to Achieve Goals: Good ADL Goals Pt Will Perform Lower Body Dressing: with modified independence;sit to/from stand;sitting/lateral leans;with adaptive equipment Additional ADL Goal #1: Pt will indep instruct family in compression stocking mgt and polar care mgt.   OT Frequency:  Min 2X/week    Co-evaluation              AM-PAC OT 6 Clicks Daily Activity     Outcome Measure Help from another person eating meals?: None Help from another person taking care of personal grooming?: None Help from another person toileting, which includes using toliet, bedpan, or urinal?: A Little Help from another person bathing (including washing, rinsing, drying)?: A Little Help from another person to  put on and taking off regular upper body clothing?: None Help from another person to put on and taking off regular lower body clothing?: A Little 6 Click Score: 21   End of Session Equipment Utilized During Treatment: Rolling walker (2 wheels);Gait belt Nurse Communication: Mobility status;Other (comment) (TED hose, grippy socks)  Activity Tolerance: Patient tolerated treatment well Patient left: in chair;with call bell/phone within reach;with family/visitor present;Other (comment) (polar care in place)  OT Visit Diagnosis: Other abnormalities of gait and mobility (R26.89);Pain Pain - Right/Left: Right Pain - part of body: Knee                Time: 8443-8367 OT Time Calculation (min):  36 min Charges:  OT General Charges $OT Visit: 1 Visit OT Evaluation $OT Eval Low Complexity: 1 Low OT Treatments $Self Care/Home Management : 23-37 mins  Warren SAUNDERS., MPH, MS, OTR/L ascom [phone] 06/03/2024, 4:45 PM

## 2024-06-03 NOTE — Op Note (Signed)
 OPERATIVE NOTE  DATE OF SURGERY:  06/03/2024  PATIENT NAME:  Theresa Best   DOB: Jun 08, 1961  MRN: [id number]  PRE-OPERATIVE DIAGNOSIS: Degenerative arthrosis of the right knee, primary  POST-OPERATIVE DIAGNOSIS:  Same  PROCEDURE:  Right total knee arthroplasty using computer-assisted navigation  SURGEON:  Lynwood SHAUNNA Mardee Mickey. M.D.  ASSISTANT:  Vick Dunk, PA-C (present and scrubbed throughout the case, critical for assistance with exposure, retraction, instrumentation, and closure)  ANESTHESIA: spinal  ESTIMATED BLOOD LOSS: 50 mL  FLUIDS REPLACED: 600 mL of crystalloid  TOURNIQUET TIME: 89 minutes  DRAINS: 2 medium Hemovac drains  SOFT TISSUE RELEASES: Anterior cruciate ligament, posterior cruciate ligament, deep medial collateral ligament, patellofemoral ligament  IMPLANTS UTILIZED: DePuy Attune size 6N posterior stabilized femoral component (cemented), size 5 rotating platform tibial component (cemented), 35 mm medialized dome patella (cemented), and a 7 mm stabilized rotating platform polyethylene insert.  INDICATIONS FOR SURGERY: Theresa Best is a 63 y.o. year old female with a long history of progressive knee pain. X-rays demonstrated severe degenerative changes in tricompartmental fashion. The patient had not seen any significant improvement despite conservative nonsurgical intervention. After discussion of the risks and benefits of surgical intervention, the patient expressed understanding of the risks benefits and agree with plans for total knee arthroplasty.   The risks, benefits, and alternatives were discussed at length including but not limited to the risks of infection, bleeding, nerve injury, stiffness, blood clots, the need for revision surgery, cardiopulmonary complications, among others, and they were willing to proceed.  PROCEDURE IN DETAIL: The patient was brought into the operating room and, after adequate spinal anesthesia was achieved, a tourniquet was  placed on the patient's upper thigh. The patient's knee and leg were cleaned and prepped with alcohol and DuraPrep and draped in the usual sterile fashion. A timeout was performed as per usual protocol. The lower extremity was exsanguinated using an Esmarch, and the tourniquet was inflated to 300 mmHg. An anterior longitudinal incision was made followed by a standard mid vastus approach. The deep fibers of the medial collateral ligament were elevated in a subperiosteal fashion off of the medial flare of the tibia so as to maintain a continuous soft tissue sleeve. The patella was subluxed laterally and the patellofemoral ligament was incised. Inspection of the knee demonstrated severe degenerative changes with full-thickness loss of articular cartilage. Osteophytes were debrided using a rongeur. Anterior and posterior cruciate ligaments were excised. Two 4.0 mm Schanz pins were inserted in the femur and into the tibia for attachment of the array of trackers used for computer-assisted navigation. Hip center was identified using a circumduction technique. Distal landmarks were mapped using the computer. The distal femur and proximal tibia were mapped using the computer. The distal femoral cutting guide was positioned using computer-assisted navigation so as to achieve a 5 distal valgus cut. The femur was sized and it was felt that a size 6N femoral component was appropriate. A size 6 femoral cutting guide was positioned and the anterior cut was performed and verified using the computer. This was followed by completion of the posterior and chamfer cuts. Femoral cutting guide for the central box was then positioned in the center box cut was performed.  Attention was then directed to the proximal tibia. Medial and lateral menisci were excised. The extramedullary tibial cutting guide was positioned using computer-assisted navigation so as to achieve a 0 varus-valgus alignment and 3 posterior slope. The cut was  performed and verified using the computer. The proximal tibia  was sized and it was felt that a size 5 tibial tray was appropriate. Tibial and femoral trials were inserted followed by insertion of a 7 mm polyethylene insert. This allowed for excellent mediolateral soft tissue balancing both in flexion and in full extension. Finally, the patella was cut and prepared so as to accommodate a 35 mm medialized dome patella. A patella trial was placed and the knee was placed through a range of motion with excellent patellar tracking appreciated. The femoral trial was removed after debridement of posterior osteophytes. The central post-hole for the tibial component was reamed followed by insertion of a keel punch. Tibial trials were then removed. Cut surfaces of bone were irrigated with copious amounts of normal saline using pulsatile lavage and then suctioned dry. Polymethylmethacrylate cement with gentamicin was prepared in the usual fashion using a vacuum mixer. Cement was applied to the cut surface of the proximal tibia as well as along the undersurface of a size 5 rotating platform tibial component. Tibial component was positioned and impacted into place. Excess cement was removed using Personal assistant. Cement was then applied to the cut surfaces of the femur as well as along the posterior flanges of the size 6N femoral component. The femoral component was positioned and impacted into place. Excess cement was removed using Personal assistant. A 7 mm polyethylene trial was inserted and the knee was brought into full extension with steady axial compression applied. Finally, cement was applied to the backside of a 35 mm medialized dome patella and the patellar component was positioned and patellar clamp applied. Excess cement was removed using Personal assistant. After adequate curing of the cement, the tourniquet was deflated after a total tourniquet time of 89 minutes. Hemostasis was achieved using electrocautery. The knee was  irrigated with copious amounts of normal saline using pulsatile lavage followed by 450 ml of Surgiphor and then suctioned dry. 20 mL of 1.3% Exparel  and 60 mL of 0.25% Marcaine  in 40 mL of normal saline was injected along the posterior capsule, medial and lateral gutters, and along the arthrotomy site. A 7 mm stabilized rotating platform polyethylene insert was inserted and the knee was placed through a range of motion with excellent mediolateral soft tissue balancing appreciated and excellent patellar tracking noted. 2 medium drains were placed in the wound bed and brought out through separate stab incisions. The medial parapatellar portion of the incision was reapproximated using interrupted sutures of #1 Vicryl. Subcutaneous tissue was approximated in layers using first #0 Vicryl followed #2-0 Vicryl. The skin was approximated with skin staples. A sterile dressing was applied.  The patient tolerated the procedure well and was transported to the recovery room in stable condition.    Alanmichael Barmore P. Bilan Tedesco, Jr., M.D.

## 2024-06-03 NOTE — Anesthesia Procedure Notes (Signed)
 Spinal  Patient location during procedure: OR Start time: 06/03/2024 7:24 AM End time: 06/03/2024 7:38 AM Reason for block: surgical anesthesia  Staffing Performed: anesthesiologist  Authorized by: Stevan Fairy POUR, MD   Performed by: Stevan Fairy POUR, MD  Preanesthetic Checklist Completed: patient identified, IV checked, site marked, risks and benefits discussed, surgical consent, monitors and equipment checked, pre-op evaluation and timeout performed Spinal Block Patient position: sitting Prep: ChloraPrep Patient monitoring: heart rate, continuous pulse ox, blood pressure and cardiac monitor Approach: midline Location: L3-4 Injection technique: single-shot Needle Needle type: Whitacre and Introducer  Needle gauge: 24 G Needle length: 9 cm Assessment Sensory level: T10 Events: CSF return  Additional Notes First attempts by CRNA, final attempts by MD  Sterile aseptic technique used throughout the procedure.  Negative paresthesia. Negative blood return. Positive free-flowing CSF. Expiration date of kit checked and confirmed. Patient tolerated procedure well, without complications.

## 2024-06-03 NOTE — Evaluation (Signed)
 Physical Therapy Evaluation Patient Details Name: Theresa Best MRN: [id number] DOB: [birth year] Today's Date: 06/03/2024  History of Present Illness  Pt is a 63 y/o F admitted on 06/03/24 for scheduled R TKA. PMH: HTN, lymphedema, cystocele, uterine prolapse  Clinical Impression  Pt seen for PT evaluation with pt agreeable to tx. Provided pt with HEP handout & pt peforms RLE strengthening/ROM exercises with PRN AAROM & cuing re: technique. Pt is able to progress to ambulating in hallway with RW & CGA. Anticipate pt will progress well with mobility.         If plan is discharge home, recommend the following: A little help with walking and/or transfers;A little help with bathing/dressing/bathroom;Assistance with cooking/housework;Assist for transportation;Help with stairs or ramp for entrance   Can travel by private vehicle        Equipment Recommendations BSC/3in1;Rolling walker (2 wheels)  Recommendations for Other Services       Functional Status Assessment Patient has had a recent decline in their functional status and demonstrates the ability to make significant improvements in function in a reasonable and predictable amount of time.     Precautions / Restrictions Precautions Precautions: Fall Precaution/Restrictions Comments: watch O2 Restrictions Weight Bearing Restrictions Per Provider Order: Yes RLE Weight Bearing Per Provider Order: Weight bearing as tolerated      Mobility  Bed Mobility Overal bed mobility: Needs Assistance Bed Mobility: Supine to Sit     Supine to sit: Supervision, HOB elevated, Used rails (exit L side of bed)          Transfers Overall transfer level: Needs assistance Equipment used: Rolling walker (2 wheels) Transfers: Sit to/from Stand Sit to Stand: Contact guard assist           General transfer comment: education/cuing re: hand placement to push to standing, reach back & slowly lower during stand>sit     Ambulation/Gait Ambulation/Gait assistance: Contact guard assist Gait Distance (Feet): 125 Feet Assistive device: Rolling walker (2 wheels) Gait Pattern/deviations: Decreased step length - right, Decreased step length - left, Decreased stride length Gait velocity: decreased     General Gait Details: slight trunk flexion/lean onto RW  Stairs            Wheelchair Mobility     Tilt Bed    Modified Rankin (Stroke Patients Only)       Balance Overall balance assessment: Needs assistance Sitting-balance support: Feet supported Sitting balance-Leahy Scale: Good     Standing balance support: During functional activity, Bilateral upper extremity supported, Reliant on assistive device for balance Standing balance-Leahy Scale: Fair                               Pertinent Vitals/Pain Pain Assessment Pain Assessment: Faces Faces Pain Scale: Hurts even more Pain Location: R knee with movement Pain Descriptors / Indicators: Grimacing, Discomfort Pain Intervention(s): Monitored during session, Limited activity within patient's tolerance (utilized polar care at beginning & end of session)    Home Living Family/patient expects to be discharged to:: Private residence Living Arrangements: Spouse/significant other Available Help at Discharge: Family Type of Home: House Home Access: Stairs to enter Entrance Stairs-Rails: None Entrance Stairs-Number of Steps: 2   Home Layout: One level;Able to live on main level with bedroom/bathroom Home Equipment: None      Prior Function Prior Level of Function : Independent/Modified Independent             Mobility Comments:  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 ADLs Comments: Independent.     Extremity/Trunk Assessment   Upper Extremity Assessment Upper Extremity Assessment: Overall WFL for tasks assessed    Lower Extremity Assessment Lower Extremity Assessment: RLE  deficits/detail RLE Deficits / Details: 3-/5 knee extension in sitting    Cervical / Trunk Assessment Cervical / Trunk Assessment: Normal  Communication   Communication Communication: No apparent difficulties    Cognition Arousal: Alert Behavior During Therapy: WFL for tasks assessed/performed   PT - Cognitive impairments: No apparent impairments                         Following commands: Intact       Cueing Cueing Techniques: Verbal cues     General Comments General comments (skin integrity, edema, etc.): lowest SpO2 86% on room air, pt able to increase to >/= 92% with cuing re: pursed lip breathing; SpO2 93% on room air at end of session, nurse made aware.    Exercises Total Joint Exercises Ankle Circles/Pumps: AROM, Supine, Right, 10 reps Quad Sets: AROM, Strengthening, Right, 10 reps, Supine Short Arc Quad: AROM, Supine, Strengthening, Right, 10 reps Heel Slides: AROM, Strengthening, Right, 10 reps, Supine Hip ABduction/ADduction: AAROM, Supine, AROM, Strengthening, Right, 10 reps Straight Leg Raises: AAROM, Supine, Strengthening, Right, 10 reps Long Arc Quad: AROM, Strengthening, Right, 10 reps, Seated Knee Flexion: AAROM, AROM, Right, 10 reps, Seated Other Exercises Other Exercises: Provided pt with HEP handout, educated pt & spouse on use of polar care, need to promote R knee extension, importance of walking every hour upon return home.   Assessment/Plan    PT Assessment Patient needs continued PT services  PT Problem List Decreased strength;Cardiopulmonary status limiting activity;Pain;Decreased range of motion;Decreased activity tolerance;Decreased knowledge of use of DME;Decreased mobility;Decreased knowledge of precautions;Decreased balance       PT Treatment Interventions DME instruction;Therapeutic exercise;Gait training;Balance training;Stair training;Neuromuscular re-education;Functional mobility training;Therapeutic activities;Patient/family  education;Modalities    PT Goals (Current goals can be found in the Care Plan section)  Acute Rehab PT Goals Patient Stated Goal: decreased pain, recover s/p sx PT Goal Formulation: With patient Time For Goal Achievement: 06/17/24 Potential to Achieve Goals: Good    Frequency BID     Co-evaluation               AM-PAC PT 6 Clicks Mobility  Outcome Measure Help needed turning from your back to your side while in a flat bed without using bedrails?: None Help needed moving from lying on your back to sitting on the side of a flat bed without using bedrails?: A Little Help needed moving to and from a bed to a chair (including a wheelchair)?: A Little Help needed standing up from a chair using your arms (e.g., wheelchair or bedside chair)?: A Little Help needed to walk in hospital room?: A Little Help needed climbing 3-5 steps with a railing? : A Lot 6 Click Score: 18    End of Session   Activity Tolerance: Patient tolerated treatment well Patient left: in chair;with call bell/phone within reach;with family/visitor present Nurse Communication: Mobility status (O2) PT Visit Diagnosis: Pain;Other abnormalities of gait and mobility (R26.89);Difficulty in walking, not elsewhere classified (R26.2);Muscle weakness (generalized) (M62.81) Pain - Right/Left: Right Pain - part of body: Knee    Time: 1400-1435 PT Time Calculation (min) (ACUTE ONLY): 35 min   Charges:   PT Evaluation $PT Eval Low Complexity: 1 Low  PT Treatments $Therapeutic Exercise: 8-22 mins PT General Charges $$ ACUTE PT VISIT: 1 Visit         Richerd Pinal, PT, DPT 06/03/2024, 2:45 PM   Richerd CHRISTELLA Pinal 06/03/2024, 2:45 PM

## 2024-06-03 NOTE — Plan of Care (Signed)
  Problem: Education: Goal: Knowledge of the prescribed therapeutic regimen will improve Outcome: Progressing   Problem: Activity: Goal: Ability to avoid complications of mobility impairment will improve Outcome: Progressing Goal: Range of joint motion will improve Outcome: Progressing   Problem: Clinical Measurements: Goal: Postoperative complications will be avoided or minimized Outcome: Progressing   Problem: Pain Management: Goal: Pain level will decrease with appropriate interventions Outcome: Progressing

## 2024-06-04 ENCOUNTER — Other Ambulatory Visit: Payer: Self-pay

## 2024-06-04 ENCOUNTER — Encounter: Payer: Self-pay | Admitting: Orthopedic Surgery

## 2024-06-04 DIAGNOSIS — M1711 Unilateral primary osteoarthritis, right knee: Secondary | ICD-10-CM | POA: Diagnosis not present

## 2024-06-04 MED ORDER — CELECOXIB 200 MG PO CAPS
200.0000 mg | ORAL_CAPSULE | Freq: Two times a day (BID) | ORAL | 2 refills | Status: AC
  Filled 2024-06-04: qty 60, 30d supply, fill #0

## 2024-06-04 MED ORDER — SULFAMETHOXAZOLE-TRIMETHOPRIM 800-160 MG PO TABS
1.0000 | ORAL_TABLET | Freq: Two times a day (BID) | ORAL | 0 refills | Status: AC
  Filled 2024-06-04: qty 6, 3d supply, fill #0

## 2024-06-04 MED ORDER — CHLORHEXIDINE GLUCONATE 4 % EX SOLN
1.0000 | CUTANEOUS | 1 refills | Status: AC
  Filled 2024-06-04: qty 118, 7d supply, fill #0

## 2024-06-04 MED ORDER — ACETAMINOPHEN 325 MG PO TABS: 325.0000 mg | ORAL_TABLET | Freq: Four times a day (QID) | ORAL | Status: AC | PRN

## 2024-06-04 MED ORDER — ASPIRIN 81 MG PO CHEW: 81.0000 mg | CHEWABLE_TABLET | Freq: Two times a day (BID) | ORAL | Status: AC

## 2024-06-04 MED ORDER — OXYCODONE HCL 5 MG PO TABS
5.0000 mg | ORAL_TABLET | ORAL | 0 refills | Status: AC | PRN
  Filled 2024-06-04: qty 30, 5d supply, fill #0

## 2024-06-04 MED ORDER — TRAMADOL HCL 50 MG PO TABS
50.0000 mg | ORAL_TABLET | ORAL | 1 refills | Status: AC | PRN
  Filled 2024-06-04: qty 30, 3d supply, fill #0

## 2024-06-04 MED ORDER — SULFAMETHOXAZOLE-TRIMETHOPRIM 800-160 MG PO TABS
1.0000 | ORAL_TABLET | Freq: Two times a day (BID) | ORAL | Status: DC
  Administered 2024-06-04: 10:00:00 1 via ORAL
  Filled 2024-06-04: qty 1

## 2024-06-04 MED ORDER — MUPIROCIN 2 % EX OINT
1.0000 | TOPICAL_OINTMENT | Freq: Two times a day (BID) | CUTANEOUS | 0 refills | Status: AC
  Filled 2024-06-04: qty 22, 11d supply, fill #0

## 2024-06-04 NOTE — Progress Notes (Signed)
 Subjective: 1 Day Post-Op Procedures (LRB): ARTHROPLASTY, KNEE, TOTAL, USING IMAGELESS COMPUTER-ASSISTED NAVIGATION (Right) Patient reports pain as mild.   Patient is well, and has had no acute complaints or problems Denies any CP, SOB, N/V, fevers or chills We will start therapy today.  Plan is to go Home after hospital stay.  Objective: Vital signs in last 24 hours: Temp:  [97.2 F (36.2 C)-98.2 F (36.8 C)] 98.2 F (36.8 C) (12/16 0735) Pulse Rate:  [53-78] 62 (12/16 0735) Resp:  [7-25] 17 (12/16 0735) BP: (94-119)/(57-70) 113/60 (12/16 0735) SpO2:  [90 %-95 %] 95 % (12/16 0735) Weight:  [121.9 kg] 121.9 kg (12/15 1319)  Intake/Output from previous day:  Intake/Output Summary (Last 24 hours) at 06/04/2024 0818 Last data filed at 06/04/2024 0514 Gross per 24 hour  Intake 2437.06 ml  Output 560 ml  Net 1877.06 ml    Intake/Output this shift: No intake/output data recorded.  Labs: No results for input(s): HGB in the last 72 hours. No results for input(s): WBC, RBC, HCT, PLT in the last 72 hours. No results for input(s): NA, K, CL, CO2, BUN, CREATININE, GLUCOSE, CALCIUM  in the last 72 hours. No results for input(s): LABPT, INR in the last 72 hours.  EXAM General - Patient is alert, appropriate, and oriented. Extremity - ABD soft Neurovascular intact Sensation intact distally Intact pulses distally Dorsiflexion/Plantar flexion intact Dressing -  dressing C/D/I. Polar care unit in place. JP Drain pulled without difficulty. Intact Motor Function - intact, moving foot and toes well on exam. Straight leg raise independently.   Past Medical History:  Diagnosis Date   Arthritis    Asthma    Complication of anesthesia    slow to wake up during prolaspe surgery, woke up during colonoscopy   History of kidney stones    Hypertension    Right knee pain    Sleep apnea    uses cpap    Assessment/Plan: 1 Day Post-Op Procedures  (LRB): ARTHROPLASTY, KNEE, TOTAL, USING IMAGELESS COMPUTER-ASSISTED NAVIGATION (Right) Principal Problem:   History of total knee arthroplasty, right  Estimated body mass index is 42.09 kg/m as calculated from the following:   Height as of this encounter: 5' 7 (1.702 m).   Weight as of this encounter: 121.9 kg. Advance diet Up with therapy D/C IV fluids Discharge home with home health  Patient will continue to work with physical therapy to pass postoperative PT protocols, ROM and strengthening  Discussed with the patient continuing to utilize Polar Care  Patient will use bone foam in 20-30 minute intervals  Patient will wear TED hose bilaterally to help prevent DVT and clot formation  Discussed the Aquacel bandage.  This bandage will stay in place 7 days postoperatively.  Can be replaced with honeycomb bandages that will be sent home with the patient  Discussed sending the patient home with tramadol  and oxycodone  for as needed pain management.  Patient will also be sent home with Celebrex  to help with swelling and inflammation.  Patient will take an 81 mg aspirin  twice daily for DVT prophylaxis  JP drain removed without difficulty, intact  Weight-Bearing as tolerated to right leg  Cleared for discharge. Patient will follow-up with [facility] orthopedics in 2 weeks for staple removal and reevaluation  Vick Dunk, PA-C [facility] 06/04/2024, 8:18 AM

## 2024-06-04 NOTE — Progress Notes (Signed)
 Physical Therapy Treatment Patient Details Name: Theresa Best MRN: [id number] DOB: March 16, 1961 Today's Date: 06/04/2024   History of Present Illness Pt is a 63 y/o F admitted on 06/03/24 for scheduled R TKA. PMH: HTN, lymphedema, cystocele, uterine prolapse    PT Comments  Pt seen for PT tx with pt agreeable to tx. Pt is able to ambulate with RW & supervision, negotiate stairs with RW. Pt is progressing well with mobility & anticipating d/c home today.     If plan is discharge home, recommend the following: A little help with walking and/or transfers;A little help with bathing/dressing/bathroom;Assistance with cooking/housework;Assist for transportation;Help with stairs or ramp for entrance   Can travel by private vehicle        Equipment Recommendations  BSC/3in1;Rolling walker (2 wheels)    Recommendations for Other Services       Precautions / Restrictions Precautions Precautions: Fall;Knee Recall of Precautions/Restrictions: Intact Restrictions Weight Bearing Restrictions Per Provider Order: Yes RLE Weight Bearing Per Provider Order: Weight bearing as tolerated     Mobility  Bed Mobility Overal bed mobility: Modified Independent Bed Mobility: Supine to Sit     Supine to sit: Modified independent (Device/Increase time), HOB elevated, Used rails (exit R side of bed)          Transfers Overall transfer level: Needs assistance Equipment used: Rolling walker (2 wheels) Transfers: Sit to/from Stand Sit to Stand: Supervision           General transfer comment: cuing re: hand placement to push to standing    Ambulation/Gait Ambulation/Gait assistance: Supervision Gait Distance (Feet): 175 Feet (+ 175 ft) Assistive device: Rolling walker (2 wheels) Gait Pattern/deviations: Decreased step length - right, Decreased step length - left, Decreased stride length, Decreased weight shift to right, Decreased dorsiflexion - right Gait velocity: decreased          Stairs Stairs: Yes Stairs assistance: Contact guard assist Stair Management: No rails, With walker, Backwards Number of Stairs:  (2+2+2+4) General stair comments: PT provides education re: stair negotiation backwards with RW & 2nd person providing stabilization at RW, compensatory pattern. Pt able to negotiate with PT & then negotiate with spouse's assistance. Reports comfort with task.   Wheelchair Mobility     Tilt Bed    Modified Rankin (Stroke Patients Only)       Balance Overall balance assessment: Needs assistance Sitting-balance support: Feet supported Sitting balance-Leahy Scale: Good     Standing balance support: During functional activity, Bilateral upper extremity supported, Reliant on assistive device for balance Standing balance-Leahy Scale: Good                              Communication Communication Communication: No apparent difficulties  Cognition Arousal: Alert Behavior During Therapy: WFL for tasks assessed/performed   PT - Cognitive impairments: No apparent impairments                       PT - Cognition Comments: somewhat anxious with stair negotiation, improves with practice, reassurance Following commands: Intact      Cueing Cueing Techniques: Verbal cues  Exercises      General Comments        Pertinent Vitals/Pain Pain Assessment Pain Assessment: 0-10 Pain Score: 5  Pain Location: R knee with mobility Pain Descriptors / Indicators: Discomfort Pain Intervention(s): Monitored during session    Home Living  Prior Function            PT Goals (current goals can now be found in the care plan section) Acute Rehab PT Goals Patient Stated Goal: decreased pain, recover s/p sx PT Goal Formulation: With patient Time For Goal Achievement: 06/17/24 Potential to Achieve Goals: Good Progress towards PT goals: Progressing toward goals    Frequency    BID      PT  Plan      Co-evaluation              AM-PAC PT 6 Clicks Mobility   Outcome Measure  Help needed turning from your back to your side while in a flat bed without using bedrails?: None Help needed moving from lying on your back to sitting on the side of a flat bed without using bedrails?: A Little Help needed moving to and from a bed to a chair (including a wheelchair)?: A Little Help needed standing up from a chair using your arms (e.g., wheelchair or bedside chair)?: A Little Help needed to walk in hospital room?: A Little Help needed climbing 3-5 steps with a railing? : A Little 6 Click Score: 19    End of Session   Activity Tolerance: Patient tolerated treatment well Patient left: in bed;with call bell/phone within reach;with family/visitor present Nurse Communication: Mobility status PT Visit Diagnosis: Pain;Other abnormalities of gait and mobility (R26.89);Difficulty in walking, not elsewhere classified (R26.2);Muscle weakness (generalized) (M62.81) Pain - Right/Left: Right Pain - part of body: Knee     Time: 0902-0918 PT Time Calculation (min) (ACUTE ONLY): 16 min  Charges:    $Therapeutic Activity: 8-22 mins PT General Charges $$ ACUTE PT VISIT: 1 Visit                     Richerd Pinal, PT, DPT 06/04/2024, 11:52 AM   Richerd CHRISTELLA Pinal 06/04/2024, 11:52 AM

## 2024-06-04 NOTE — TOC Transition Note (Signed)
 Transition of Care [facility]) - Discharge Note   Patient Details  Name: Theresa Best MRN: [id number] Date of Birth: 01/12/1961  [facility] CM/SW Contact:  Victory Jackquline RAMAN, RN Phone Number: 06/04/2024, 9:30 AM   Clinical Narrative:   Patient discharging Home With HH/[facility], DME delivered to patient's bedside (RW and BSC). . No further concerns. RNCM signing off.    Final next level of care: Home w Home Health Services Barriers to Discharge: Barriers Resolved   Patient Goals and CMS Choice            Discharge Placement                Patient to be transferred to facility by: Spouse Name of family member notified: Deward Patient and family notified of of transfer: 06/04/24  Discharge Plan and Services Additional resources added to the After Visit Summary for     Discharge Planning Services: CM Consult            DME Arranged: 3-N-1, Walker rolling DME Agency: [employer] Date DME Agency Contacted: 06/03/24 Time DME Agency Contacted: [phone] Representative spoke with at DME Agency: [facility] HH Arranged: PT, OT HH Agency: [facility] Date [facility] Agency Contacted: 06/03/24 Time HH Agency Contacted: [phone] Representative spoke with at [facility] Agency: [place]   Social Drivers of Health (SDOH) Interventions SDOH Screenings   Food Insecurity: Food Insecurity Present (06/03/2024)  Housing: Low Risk (06/03/2024)  Transportation Needs: No Transportation Needs (06/03/2024)  Utilities: Not At Risk (06/03/2024)  Depression (PHQ2-9): Low Risk (05/29/2023)  Financial Resource Strain: Low Risk  (05/06/2024)   Received from [facility]  Physical Activity: Sufficiently Active (05/06/2024)   Received from [facility]  Social Connections: Socially Integrated (05/06/2024)   Received from [facility]  Stress: No Stress Concern Present (05/06/2024)   Received from [facility]  Tobacco Use: Low Risk  (06/03/2024)  Health Literacy: Adequate Health Literacy (05/06/2024)   Received from [facility]     Readmission Risk Interventions     No data to display

## 2024-06-04 NOTE — Progress Notes (Signed)
 Pts BP 102/60, pt asymptomatic. MD Hooten notified. Per MD Hooten pt ok to discharge.

## 2024-06-04 NOTE — Progress Notes (Signed)
 Pts BP 113/60, Julianna, PA notified, Per Vick ok to hold BP scheduled meds (see MAR).

## 2024-06-04 NOTE — Discharge Summary (Signed)
 Physician Discharge Summary  Patient ID: Theresa Best MRN: [id number] DOB/AGE: 1961-06-17 63 y.o.  Admit date: 06/03/2024 Discharge date: 06/04/2024  Admission Diagnoses:  Primary osteoarthritis of right knee [M17.11] History of total knee arthroplasty, right [Z96.651]   Discharge Diagnoses: Patient Active Problem List   Diagnosis Date Noted   History of total knee arthroplasty, right 06/03/2024   Gastroesophageal reflux disease without esophagitis 05/08/2024   Prediabetes 05/08/2024   Dry skin 05/06/2024   Severe obstructive sleep apnea 05/06/2024   Acute gout involving toe of right foot 01/30/2024   Nephrolithiasis 11/13/2023   Arthralgia of right knee 03/22/2023   Primary osteoarthritis of right knee 03/22/2023   Baker's cyst of knee, left 07/04/2022   Lymphedema 06/30/2022   Chronic venous insufficiency 03/19/2022   Varicose veins of right lower extremity with pain 07/22/2019   Familial hypercholesterolemia 07/22/2019   Uterine prolapse 08/16/2018   SUI (stress urinary incontinence, female) 08/16/2018   POP-Q stage 2 cystocele 08/16/2018   Essential hypertension 11/03/2016   Class 3 severe obesity due to excess calories with serious comorbidity and body mass index (BMI) of 40.0 to 44.9 in adult [facility]) 11/03/2016    Past Medical History:  Diagnosis Date   Arthritis    Asthma    Complication of anesthesia    slow to wake up during prolaspe surgery, woke up during colonoscopy   History of kidney stones    Hypertension    Right knee pain    Sleep apnea    uses cpap      Consultants (if any):   Discharged Condition: Improved  Hospital Course: Theresa Best is an 63 y.o. female who was admitted 06/03/2024 with a diagnosis of History of total knee arthroplasty, right and went to the operating room on 06/03/2024 and underwent the below named procedures.    Surgeries: Procedures: ARTHROPLASTY, KNEE, TOTAL, USING IMAGELESS COMPUTER-ASSISTED NAVIGATION on  06/03/2024 Patient tolerated the surgery well. Taken to PACU where she was stabilized and then transferred to the orthopedic floor.  Started on ASA 81mg  BID. Heels elevated on bed with rolled towels. No evidence of DVT. Negative Homan. Physical therapy started on day #1 for gait training and transfer. OT started day #1 for ADL and assisted devices.  Patient's IV ,and hemovac was d/c on day #1. Foley was removed shortly after surgery.   Implants: DePuy Attune size 6N posterior stabilized femoral component (cemented), size 5 rotating platform tibial component (cemented), 35 mm medialized dome patella (cemented), and a 7 mm stabilized rotating platform polyethylene insert.   She was given perioperative antibiotics:  Anti-infectives (From admission, onward)    Start     Dose/Rate Route Frequency Ordered Stop   06/04/24 1000  sulfamethoxazole -trimethoprim  (BACTRIM  DS) 800-160 MG per tablet 1 tablet        1 tablet Oral Every 12 hours 06/04/24 0610     06/04/24 0000  sulfamethoxazole -trimethoprim  (BACTRIM  DS) 800-160 MG tablet        1 tablet Oral Every 12 hours 06/04/24 0822     06/03/24 1400  ceFAZolin  (ANCEF ) IVPB 2g/100 mL premix        2 g 200 mL/hr over 30 Minutes Intravenous Every 6 hours 06/03/24 1237 06/03/24 2103   06/03/24 0600  ceFAZolin  (ANCEF ) IVPB 2g/100 mL premix        2 g 200 mL/hr over 30 Minutes Intravenous On call to O.R. 06/03/24 0559 06/03/24 0738   06/03/24 0600  vancomycin  (VANCOCIN ) IVPB 1000 mg/200 mL premix  1,000 mg 200 mL/hr over 60 Minutes Intravenous  Once 06/03/24 0559 06/03/24 0800     .  She was given sequential compression devices, early ambulation, and ASA for DVT prophylaxis.  She benefited maximally from the hospital stay and there were no complications.    Recent vital signs:  Vitals:   06/04/24 0447 06/04/24 0735  BP: (!) 119/57 113/60  Pulse: (!) 53 62  Resp: 18 17  Temp: (!) 97.2 F (36.2 C) 98.2 F (36.8 C)  SpO2: 93% 95%     Recent laboratory studies:  Lab Results  Component Value Date   HGB 14.8 02/14/2023   HGB 13.8 02/23/2022   HGB 14.1 09/17/2021   Lab Results  Component Value Date   WBC 12.8 (H) 02/14/2023   PLT 298 02/14/2023   No results found for: INR Lab Results  Component Value Date   NA 138 11/14/2023   K 4.4 11/14/2023   CL 101 11/14/2023   CO2 22 11/14/2023   BUN 13 11/14/2023   CREATININE 0.62 11/14/2023   GLUCOSE 92 11/14/2023    Discharge Medications:   Allergies as of 06/04/2024   No Known Allergies      Medication List     TAKE these medications    acetaminophen  325 MG tablet Commonly known as: TYLENOL  Take 1-2 tablets (325-650 mg total) by mouth every 6 (six) hours as needed for mild pain (pain score 1-3) (or temp > 100.5).   albuterol  (2.5 MG/3ML) 0.083% nebulizer solution Commonly known as: PROVENTIL  Take 3 mLs (2.5 mg total) by nebulization every 6 (six) hours as needed for wheezing or shortness of breath.   albuterol  108 (90 Base) MCG/ACT inhaler Commonly known as: VENTOLIN  HFA Inhale 2 puffs into the lungs every 6 (six) hours as needed for wheezing or shortness of breath.   amLODipine  10 MG tablet Commonly known as: NORVASC  Take 10 mg by mouth daily.   aspirin  81 MG chewable tablet Chew 1 tablet (81 mg total) by mouth 2 (two) times daily.   atorvastatin  20 MG tablet Commonly known as: LIPITOR Take 1 tablet (20 mg total) by mouth daily.   Azelaic Acid 15 % gel Apply 1 Application topically daily as needed (rosacea).   benzonatate  100 MG capsule Commonly known as: TESSALON  Take 1 capsule (100 mg total) by mouth 3 (three) times daily as needed.   budesonide -formoterol  80-4.5 MCG/ACT inhaler Commonly known as: SYMBICORT  Inhale 2 puffs into the lungs in the morning and at bedtime.   celecoxib  200 MG capsule Commonly known as: CELEBREX  Take 1 capsule (200 mg total) by mouth 2 (two) times daily.   chlorhexidine  4 % external liquid Commonly  known as: HIBICLENS  Apply 15 mLs (1 Application total) topically as directed for 30 doses. Use as directed daily for 5 days every other week for 6 weeks.   cyanocobalamin 1000 MCG tablet Commonly known as: VITAMIN B12 Take 1,000 mcg by mouth daily.   hydrochlorothiazide  25 MG tablet Commonly known as: HYDRODIURIL  Take 1 tablet (25 mg total) by mouth daily.   hydrocortisone 2.5 % ointment Apply 1 Application topically 2 (two) times daily as needed (itching).   ketoconazole 2 % cream Commonly known as: NIZORAL Apply 1 Application topically daily as needed for irritation.   losartan  25 MG tablet Commonly known as: COZAAR  Take 25 mg by mouth daily.   MAGNESIUM  COMPLEX PO Take by mouth daily.   montelukast  10 MG tablet Commonly known as: SINGULAIR  Take 1 tablet (10 mg total)  by mouth at bedtime.   mupirocin  ointment 2 % Commonly known as: BACTROBAN  Place 1 Application into the nose 2 (two) times daily for 60 doses. Use as directed 2 times daily for 5 days every other week for 6 weeks.   nystatin powder Commonly known as: MYCOSTATIN/NYSTOP Apply 1 Application topically daily as needed (rash).   ondansetron  4 MG disintegrating tablet Commonly known as: ZOFRAN -ODT Take 1 tablet (4 mg total) by mouth every 8 (eight) hours as needed for nausea or vomiting.   oxybutynin  5 MG 24 hr tablet Commonly known as: DITROPAN -XL Take 5 mg by mouth at bedtime.   oxyCODONE  5 MG immediate release tablet Commonly known as: Oxy IR/ROXICODONE  Take 1 tablet (5 mg total) by mouth every 4 (four) hours as needed for moderate pain (pain score 4-6) (pain score 4-6).   pantoprazole  40 MG tablet Commonly known as: PROTONIX  Take 1 tablet (40 mg total) by mouth daily as needed (acid reflux).   sulfamethoxazole -trimethoprim  800-160 MG tablet Commonly known as: BACTRIM  DS Take 1 tablet by mouth every 12 (twelve) hours.   traMADol  50 MG tablet Commonly known as: ULTRAM  Take 1-2 tablets (50-100 mg  total) by mouth every 4 (four) hours as needed for moderate pain (pain score 4-6).   tretinoin 0.05 % cream Commonly known as: RETIN-A Apply 1 Application topically at bedtime as needed (acne).   triamcinolone cream 0.1 % Commonly known as: KENALOG Apply 0.1 % topically daily as needed (irritation).               Durable Medical Equipment  (From admission, onward)           Start     Ordered   06/03/24 1201  DME Walker rolling  Once       Question:  Patient needs a walker to treat with the following condition  Answer:  Total knee replacement status   06/03/24 1201   06/03/24 1201  DME Bedside commode  Once       Comments: Patient is not able to walk the distance required to go the bathroom, or he/she is unable to safely negotiate stairs required to access the bathroom.  A 3in1 BSC will alleviate this problem  Question:  Patient needs a bedside commode to treat with the following condition  Answer:  Total knee replacement status   06/03/24 1201            Diagnostic Studies: DG Knee Right Port Result Date: 06/03/2024 EXAM: 1 VIEW(S) XRAY OF THE RIGHT KNEE 06/03/2024 12:33:00 PM COMPARISON: None available. CLINICAL HISTORY: Total knee replacement status. FINDINGS: BONES AND JOINTS: Right total knee arthroplasty noted. Knee arthroplasty components in expected alignment. SOFT TISSUES: Anterior skin staples, surgical drains and subcutaneous emphysema consistent with immediate postop status. IMPRESSION: 1. Right total knee arthroplasty with expected postoperative changes. Electronically signed by: Donnice Mania MD 06/03/2024 01:08 PM EST RP Workstation: [id number]    Disposition: Discharge disposition: 01-Home or Self Care     Cleared for discharge     Follow-up Information     Castanheiro de Charlynn Dunk, [place], [place] Follow up on 06/18/2024.   Specialty: Orthopedic Surgery Why: at 1:30pm Contact information: [address] [place]  [address] [phone]         Mardee Lynwood SQUIBB, MD Follow up on 07/16/2024.   Specialty: Orthopedic Surgery Why: at 3:00pm Contact information: [address] [facility] [place] [address] [phone]  Signed: Vick Dunk, PA-C 06/04/2024, 8:22 AM

## 2024-06-04 NOTE — Progress Notes (Signed)
 DISCHARGE NOTE:  Pt given discharge instructions and verbalized understanding. TED hose on both legs. 2 honeycomb dressings, beds to beds medications, and walker sent with pt. Pt wheeled to car, husband providing transportation home.

## 2024-06-04 NOTE — Anesthesia Postprocedure Evaluation (Signed)
 Anesthesia Post Note  Patient: Theresa Best  Procedure(s) Performed: ARTHROPLASTY, KNEE, TOTAL, USING IMAGELESS COMPUTER-ASSISTED NAVIGATION (Right: Knee)  Patient location during evaluation: Other Anesthesia Type: Spinal Level of consciousness: oriented and awake and alert Pain management: pain level controlled Vital Signs Assessment: post-procedure vital signs reviewed and stable Respiratory status: spontaneous breathing, respiratory function stable and patient connected to nasal cannula oxygen  Cardiovascular status: blood pressure returned to baseline and stable Postop Assessment: no headache, no backache and no apparent nausea or vomiting Anesthetic complications: no   No notable events documented.   Last Vitals:  Vitals:   06/04/24 0124 06/04/24 0447  BP: 112/62 (!) 119/57  Pulse: 70 (!) 53  Resp: 16 18  Temp: 36.7 C (!) 36.2 C  SpO2: 94% 93%    Last Pain:  Vitals:   06/04/24 0447  TempSrc: Temporal  PainSc:                  [place] [facility]

## 2024-06-04 NOTE — Progress Notes (Signed)
 Occupational Therapy Treatment Patient Details Name: Theresa Best MRN: [id number] DOB: February 14, 1961 Today's Date: 06/04/2024   History of present illness Pt is a 63 y/o F admitted on 06/03/24 for scheduled R TKA. PMH: HTN, lymphedema, cystocele, uterine prolapse   OT comments  Pt seen for one more OT tx prior to discharge home. Pt and spouse endorse questions about donning compression stockings. Educated pt and spouse in plastic bag trick to improve ease of donning as well as positioning to optimize pt/caregiver safety and comfort. Pt required MAX A for donning TED hose on RLE. Pt/spouse denied additional questions/concerns. Appreciative of further instruction.       If plan is discharge home, recommend the following:  A little help with bathing/dressing/bathroom;Assistance with cooking/housework;Assist for transportation;Help with stairs or ramp for entrance   Equipment Recommendations  BSC/3in1;Other (comment) (RW\)    Recommendations for Other Services      Precautions / Restrictions Precautions Precautions: Fall;Knee Precaution Booklet Issued: Yes (comment) Recall of Precautions/Restrictions: Intact Restrictions Weight Bearing Restrictions Per Provider Order: Yes RLE Weight Bearing Per Provider Order: Weight bearing as tolerated       Mobility Bed Mobility               General bed mobility comments: NT, seated EOB    Transfers                         Balance Overall balance assessment: Needs assistance Sitting-balance support: Feet supported Sitting balance-Leahy Scale: Good                                     ADL either performed or assessed with clinical judgement   ADL Overall ADL's : Needs assistance/impaired                     Lower Body Dressing: Sitting/lateral leans Lower Body Dressing Details (indicate cue type and reason): MAX A for donning TED hose on RLE. Educated pt and spouse in plastic bag trick to  improve ease of donning as well as positioning to optimize pt/caregiver safety and comfort                    Extremity/Trunk Assessment              Vision       Perception     Praxis     Communication Communication Communication: No apparent difficulties   Cognition Arousal: Alert Behavior During Therapy: WFL for tasks assessed/performed Cognition: No apparent impairments                               Following commands: Intact        Cueing   Cueing Techniques: Verbal cues  Exercises      Shoulder Instructions       General Comments      Pertinent Vitals/ Pain       Pain Assessment Pain Assessment: No/denies pain  Home Living                                          Prior Functioning/Environment              Frequency  Min 2X/week  Progress Toward Goals  OT Goals(current goals can now be found in the care plan section)  Progress towards OT goals: Progressing toward goals  Acute Rehab OT Goals Patient Stated Goal: get back to playing with my grandkids OT Goal Formulation: With patient/family Time For Goal Achievement: 06/17/24 Potential to Achieve Goals: Good  Plan      Co-evaluation                 AM-PAC OT 6 Clicks Daily Activity     Outcome Measure   Help from another person eating meals?: None Help from another person taking care of personal grooming?: None Help from another person toileting, which includes using toliet, bedpan, or urinal?: A Little Help from another person bathing (including washing, rinsing, drying)?: A Little Help from another person to put on and taking off regular upper body clothing?: None Help from another person to put on and taking off regular lower body clothing?: A Little 6 Click Score: 21    End of Session    OT Visit Diagnosis: Other abnormalities of gait and mobility (R26.89);Pain Pain - Right/Left: Right Pain - part of body: Knee    Activity Tolerance Patient tolerated treatment well   Patient Left in bed;with call bell/phone within reach;with family/visitor present (seated EOB, dressed, with spouse)   Nurse Communication          Time: 9044-8995 OT Time Calculation (min): 9 min  Charges: OT General Charges $OT Visit: 1 Visit OT Treatments $Self Care/Home Management : 8-22 mins  Warren SAUNDERS., MPH, MS, OTR/L ascom [phone] 06/04/2024, 10:06 AM

## 2025-04-23 ENCOUNTER — Ambulatory Visit: Admitting: Physician Assistant
